# Patient Record
Sex: Male | Born: 1955
Health system: Southern US, Community
[De-identification: ages and names within clinical notes are randomized; demographics above are authoritative.]

## PROBLEM LIST (undated history)

## (undated) DIAGNOSIS — I213 ST elevation (STEMI) myocardial infarction of unspecified site: Secondary | ICD-10-CM

## (undated) DIAGNOSIS — I63429 Cerebral infarction due to embolism of unspecified anterior cerebral artery: Secondary | ICD-10-CM

## (undated) DIAGNOSIS — I2510 Atherosclerotic heart disease of native coronary artery without angina pectoris: Secondary | ICD-10-CM

## (undated) DIAGNOSIS — K219 Gastro-esophageal reflux disease without esophagitis: Secondary | ICD-10-CM

## (undated) DIAGNOSIS — F32A Depression, unspecified: Secondary | ICD-10-CM

## (undated) DIAGNOSIS — M1990 Unspecified osteoarthritis, unspecified site: Secondary | ICD-10-CM

## (undated) DIAGNOSIS — I469 Cardiac arrest, cause unspecified: Secondary | ICD-10-CM

## (undated) DIAGNOSIS — J9621 Acute and chronic respiratory failure with hypoxia: Secondary | ICD-10-CM

## (undated) DIAGNOSIS — F1910 Other psychoactive substance abuse, uncomplicated: Secondary | ICD-10-CM

## (undated) DIAGNOSIS — F329 Major depressive disorder, single episode, unspecified: Secondary | ICD-10-CM

## (undated) DIAGNOSIS — G4730 Sleep apnea, unspecified: Secondary | ICD-10-CM

## (undated) DIAGNOSIS — E785 Hyperlipidemia, unspecified: Secondary | ICD-10-CM

## (undated) DIAGNOSIS — J690 Pneumonitis due to inhalation of food and vomit: Secondary | ICD-10-CM

## (undated) DIAGNOSIS — F419 Anxiety disorder, unspecified: Secondary | ICD-10-CM

## (undated) DIAGNOSIS — G4733 Obstructive sleep apnea (adult) (pediatric): Secondary | ICD-10-CM

## (undated) DIAGNOSIS — I10 Essential (primary) hypertension: Secondary | ICD-10-CM

## (undated) HISTORY — PX: OTHER SURGICAL HISTORY: SHX169

## (undated) HISTORY — PX: PEG PLACEMENT: SHX5437

## (undated) HISTORY — DX: Atherosclerotic heart disease of native coronary artery without angina pectoris: I25.10

## (undated) HISTORY — DX: Hyperlipidemia, unspecified: E78.5

## (undated) HISTORY — PX: CARDIAC CATHETERIZATION: SHX172

## (undated) HISTORY — DX: Unspecified osteoarthritis, unspecified site: M19.90

## (undated) HISTORY — DX: Essential (primary) hypertension: I10

## (undated) HISTORY — PX: TRACHEOSTOMY: SUR1362

## (undated) HISTORY — PX: COLONOSCOPY: SHX174

## (undated) HISTORY — DX: Depression, unspecified: F32.A

## (undated) HISTORY — DX: Anxiety disorder, unspecified: F41.9

---
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
## 1998-10-15 ENCOUNTER — Emergency Department (HOSPITAL_COMMUNITY): Admission: EM | Admit: 1998-10-15 | Discharge: 1998-10-15 | Payer: Self-pay | Admitting: Emergency Medicine

## 1998-10-17 ENCOUNTER — Emergency Department (HOSPITAL_COMMUNITY): Admission: EM | Admit: 1998-10-17 | Discharge: 1998-10-17 | Payer: Self-pay | Admitting: Emergency Medicine

## 1998-10-24 ENCOUNTER — Emergency Department (HOSPITAL_COMMUNITY): Admission: EM | Admit: 1998-10-24 | Discharge: 1998-10-24 | Payer: Self-pay | Admitting: Emergency Medicine

## 2002-01-23 ENCOUNTER — Ambulatory Visit (HOSPITAL_BASED_OUTPATIENT_CLINIC_OR_DEPARTMENT_OTHER): Admission: RE | Admit: 2002-01-23 | Discharge: 2002-01-23 | Payer: Self-pay | Admitting: Otolaryngology

## 2002-04-10 ENCOUNTER — Encounter: Payer: Self-pay | Admitting: Internal Medicine

## 2002-04-10 ENCOUNTER — Ambulatory Visit (HOSPITAL_COMMUNITY): Admission: RE | Admit: 2002-04-10 | Discharge: 2002-04-10 | Payer: Self-pay | Admitting: Internal Medicine

## 2003-11-14 HISTORY — PX: UVULOPALATOPHARYNGOPLASTY, TONSILLECTOMY AND SEPTOPLASTY: SHX2632

## 2003-11-18 ENCOUNTER — Encounter (INDEPENDENT_AMBULATORY_CARE_PROVIDER_SITE_OTHER): Payer: Self-pay | Admitting: *Deleted

## 2003-11-18 ENCOUNTER — Ambulatory Visit (HOSPITAL_COMMUNITY): Admission: RE | Admit: 2003-11-18 | Discharge: 2003-11-19 | Payer: Self-pay | Admitting: Otolaryngology

## 2005-04-06 ENCOUNTER — Ambulatory Visit: Payer: Self-pay | Admitting: Internal Medicine

## 2006-05-22 HISTORY — PX: BICEPS TENODESIS: SHX895

## 2007-03-01 ENCOUNTER — Encounter: Payer: Self-pay | Admitting: Pulmonary Disease

## 2007-03-30 ENCOUNTER — Emergency Department (HOSPITAL_COMMUNITY): Admission: EM | Admit: 2007-03-30 | Discharge: 2007-03-30 | Payer: Self-pay | Admitting: Emergency Medicine

## 2007-04-02 ENCOUNTER — Ambulatory Visit (HOSPITAL_COMMUNITY): Admission: RE | Admit: 2007-04-02 | Discharge: 2007-04-02 | Payer: Self-pay | Admitting: Orthopedic Surgery

## 2008-02-27 ENCOUNTER — Encounter: Payer: Self-pay | Admitting: Pulmonary Disease

## 2009-02-15 ENCOUNTER — Ambulatory Visit: Payer: Self-pay | Admitting: Internal Medicine

## 2009-02-15 DIAGNOSIS — R0609 Other forms of dyspnea: Secondary | ICD-10-CM | POA: Insufficient documentation

## 2009-02-15 DIAGNOSIS — R0989 Other specified symptoms and signs involving the circulatory and respiratory systems: Secondary | ICD-10-CM | POA: Insufficient documentation

## 2009-02-15 DIAGNOSIS — R942 Abnormal results of pulmonary function studies: Secondary | ICD-10-CM | POA: Insufficient documentation

## 2009-03-05 ENCOUNTER — Ambulatory Visit: Payer: Self-pay | Admitting: Pulmonary Disease

## 2009-03-12 ENCOUNTER — Ambulatory Visit: Payer: Self-pay | Admitting: Internal Medicine

## 2009-03-13 LAB — CONVERTED CEMR LAB
ALT: 68 units/L — ABNORMAL HIGH (ref 0–53)
AST: 47 units/L — ABNORMAL HIGH (ref 0–37)
Albumin: 4.2 g/dL (ref 3.5–5.2)
Alkaline Phosphatase: 36 units/L — ABNORMAL LOW (ref 39–117)
BUN: 16 mg/dL (ref 6–23)
Basophils Absolute: 0 10*3/uL (ref 0.0–0.1)
Basophils Relative: 0.4 % (ref 0.0–3.0)
Chloride: 102 meq/L (ref 96–112)
Creatinine, Ser: 1.4 mg/dL (ref 0.4–1.5)
Direct LDL: 228 mg/dL
Eosinophils Relative: 3.4 % (ref 0.0–5.0)
Glucose, Bld: 87 mg/dL (ref 70–99)
Hemoglobin: 20.2 g/dL — ABNORMAL HIGH (ref 13.0–17.0)
Lymphocytes Relative: 28.7 % (ref 12.0–46.0)
Monocytes Relative: 9.1 % (ref 3.0–12.0)
Neutro Abs: 3.9 10*3/uL (ref 1.4–7.7)
Potassium: 4.5 meq/L (ref 3.5–5.1)
RBC: 6.21 M/uL — ABNORMAL HIGH (ref 4.22–5.81)
Total CHOL/HDL Ratio: 11
Total Protein, Urine: NEGATIVE mg/dL
Total Protein: 7.1 g/dL (ref 6.0–8.3)
Urine Glucose: NEGATIVE mg/dL
VLDL: 44.8 mg/dL — ABNORMAL HIGH (ref 0.0–40.0)

## 2009-03-19 ENCOUNTER — Ambulatory Visit: Payer: Self-pay | Admitting: Internal Medicine

## 2009-03-19 DIAGNOSIS — R7989 Other specified abnormal findings of blood chemistry: Secondary | ICD-10-CM | POA: Insufficient documentation

## 2009-03-19 DIAGNOSIS — R7401 Elevation of levels of liver transaminase levels: Secondary | ICD-10-CM | POA: Insufficient documentation

## 2009-03-19 DIAGNOSIS — F1011 Alcohol abuse, in remission: Secondary | ICD-10-CM | POA: Insufficient documentation

## 2009-03-19 DIAGNOSIS — R740 Nonspecific elevation of levels of transaminase and lactic acid dehydrogenase [LDH]: Secondary | ICD-10-CM

## 2009-03-19 DIAGNOSIS — E785 Hyperlipidemia, unspecified: Secondary | ICD-10-CM | POA: Insufficient documentation

## 2009-03-31 ENCOUNTER — Telehealth: Payer: Self-pay | Admitting: Internal Medicine

## 2009-05-22 HISTORY — PX: ANTERIOR CERVICAL DECOMP/DISCECTOMY FUSION: SHX1161

## 2009-08-19 ENCOUNTER — Encounter: Admission: RE | Admit: 2009-08-19 | Discharge: 2009-08-19 | Payer: Self-pay | Admitting: Orthopedic Surgery

## 2010-06-12 ENCOUNTER — Encounter: Payer: Self-pay | Admitting: Orthopedic Surgery

## 2010-06-12 ENCOUNTER — Encounter: Payer: Self-pay | Admitting: Internal Medicine

## 2010-10-07 NOTE — Op Note (Signed)
NAME:  Jonathon Griffin, Jonathon Griffin NO.:  [id number]   MEDICAL RECORD NO.:  [id number]                   PATIENT TYPE:  OIB   LOCATION:  2899                                 FACILITY:  [facility]   PHYSICIAN:  Suzanna Obey, M.D.                    DATE OF BIRTH:  Sep 17, 1955   DATE OF PROCEDURE:  11/18/2003  DATE OF DISCHARGE:                                 OPERATIVE REPORT   PREOPERATIVE DIAGNOSES:  1. Deviated septum.  2. Turbinate hypertrophy.  3. Obstructive sleep apnea.   POSTOPERATIVE DIAGNOSES:  1. Deviated septum.  2. Turbinate hypertrophy.  3. Obstructive sleep apnea.   SURGICAL PROCEDURES:  1. Septoplasty.  2. Submucous resection of inferior turbinates.  3. Uvulopalatopharyngoplasty.  4. Tonsillectomy.   ANESTHESIA:  General endotracheal tube.   ESTIMATED BLOOD LOSS:  Less than 20 mL.   INDICATIONS:  This is a 55 year old who has had significant obstructive  sleep apnea with evidence of anatomy problems with nasal obstruction and  palate issues.  He was informed of the risks and benefits of the procedure  including bleeding, infection, uvulopharyngeal insufficiency, change in the  voice, chronic pain, septal perforation, change in the external appearance  of the nose, chronic crusting and drying, and risk of the anesthetic.  All  questions were answered and consent was obtained.   OPERATION:  The patient was taken to the operating room and placed in the  supine position.  After adequate general endotracheal tube anesthesia, he  was placed in the supine position, prepped and draped in the usual sterile  manner.  Oxymetazoline pledgets were placed into the nose bilaterally and  then the septum and inferior turbinates were injected with 1% lidocaine with  1:100,000 epinephrine.  A right hemitransfixion incision was performed  raising the mucoperichondrial nostril flap.  The cartilage was divided at  about 2 cm posterior to the caudal strut and this  cartilage was removed with  a Therapist, nutritional.  The opposite flap was elevated and the Jansen-Middleton  forceps were used to remove the deviated portion of the bone.  The inferior  spur was removed with a 4 mm osteotome.  This corrected the septal  deflection.  Turbinates were infractured, a midline incision made with a 15  blade, mucosal flap elevated superiorly and inferior mucosa and bone were  removed with the turbinate scissors.  The edge was cauterized with suction  cautery and both turbinates were outfractured.  Semitransfixion incision  closed with interrupted 4-0 chromic and a 4-0 plain gut quilting stitch  placed to the septum.  A Telfa roll soaked in Bacitracin was placed into the  nose bilaterally and secured with a 3-0 nylon.  The operation was directed  towards the oral cavity at that point where a Crowe-Davis mouthgag was  inserted, retracted and suspended from the [facility] stand.  The patient had very  redundant palate and  thick tissues.  An incision was made just at the base  of the uvula and with electrocautery carried into the left tonsillar area  where the capsule of the tonsil was identified and removed with  electrocautery dissection.  The dissection was then carried across the  palate into the right tonsillar fossa and the tonsil was removed again with  electrocautery dissection.  At this point, the specimen was removed en bloc  with the uvula and both tonsils together.  A hot suction cautery was used to  attain hemostasis.  The palate was closed with interrupted 3-0 Vicryl.  This  made a nice closure of the palate and tonsillar fossae.  The hypopharynx,  esophagus and stomach were suctioned with an NG tube.  The oral cavity,  oropharynx was irrigated with saline.  The Crowe-Davis was released and  resuspended with hemostasis present in all locations.  The patient was  awakened and brought to recovery in stable condition, counts correct.                                                Suzanna Obey, M.D.    Jonathon Griffin  D:  11/18/2003  T:  11/18/2003  Job:  [id number]   cc:   Jonathon Griffin, M.D. [facility]

## 2011-04-12 IMAGING — CR DG CHEST 2V
2 series · 2 of 2 positions shown · non-contrast
Comparison: 04/08/1999

CLINICAL DATA: Dyspnea

CHEST - 2 VIEW

[view not recorded (1 of 2)]
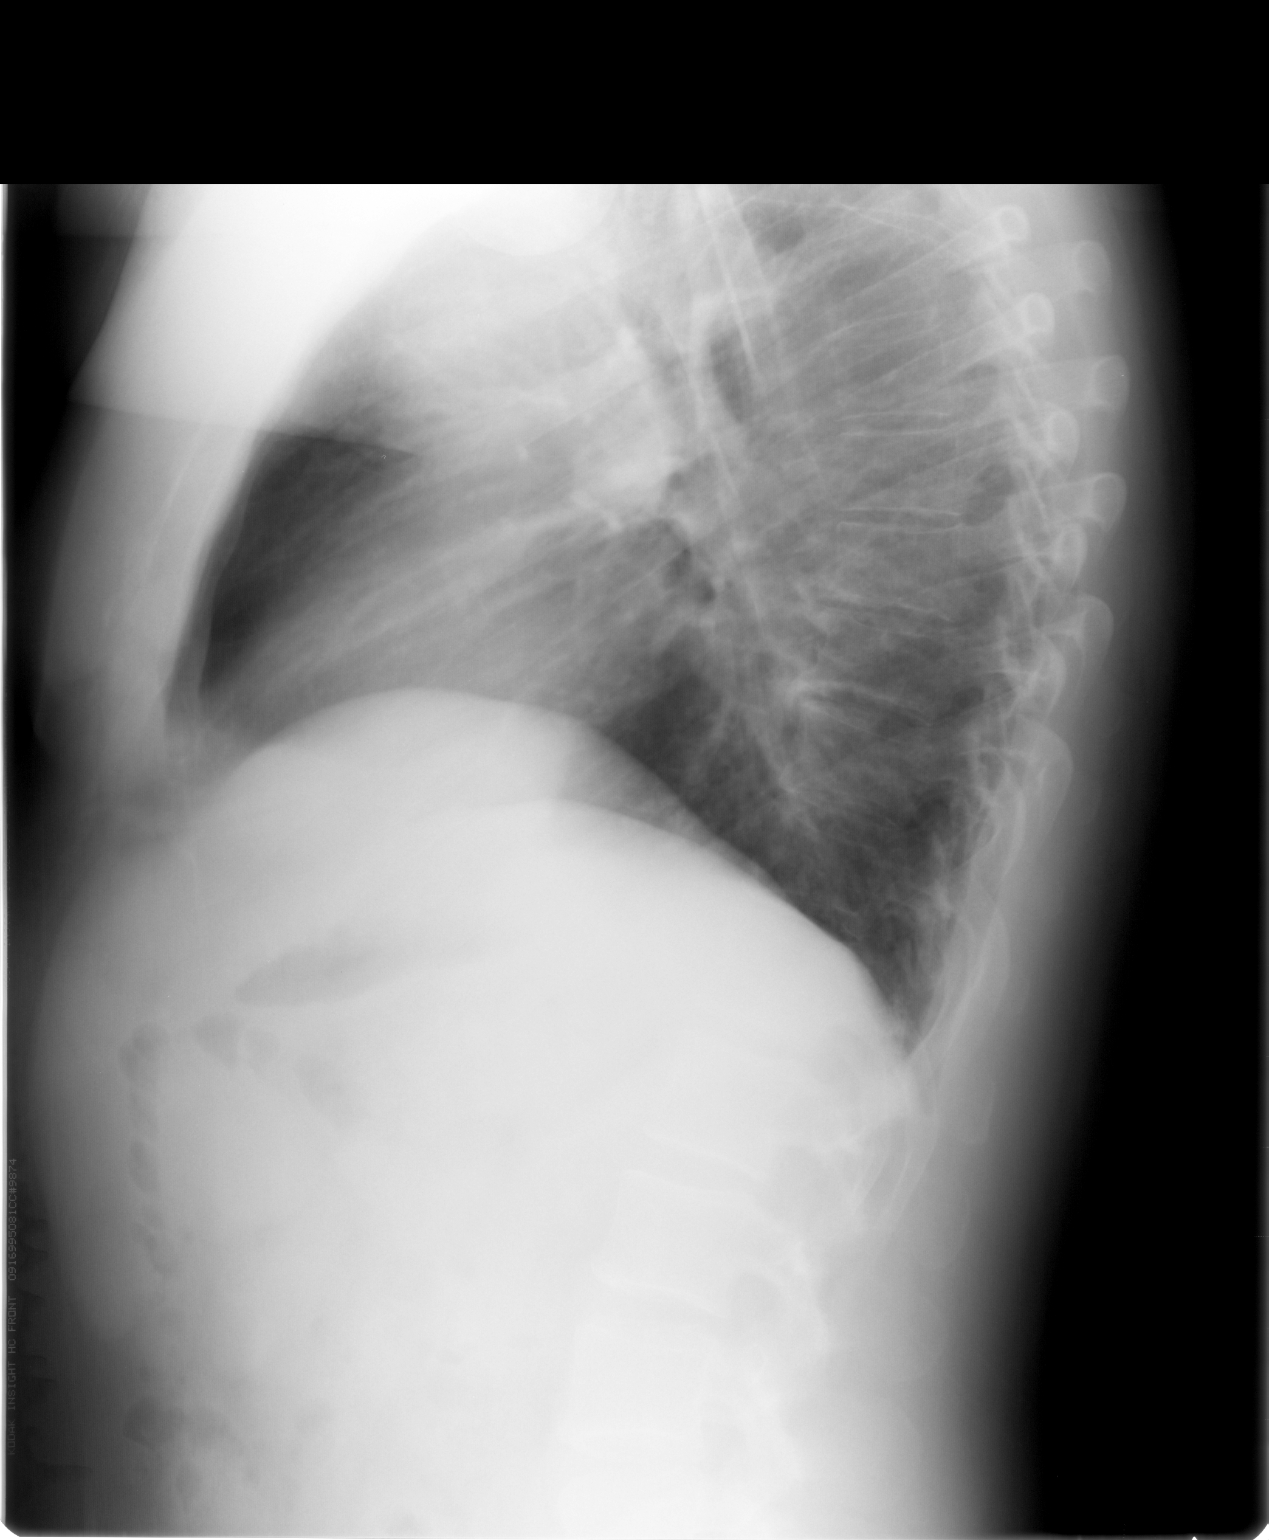

[view not recorded (2 of 2)]
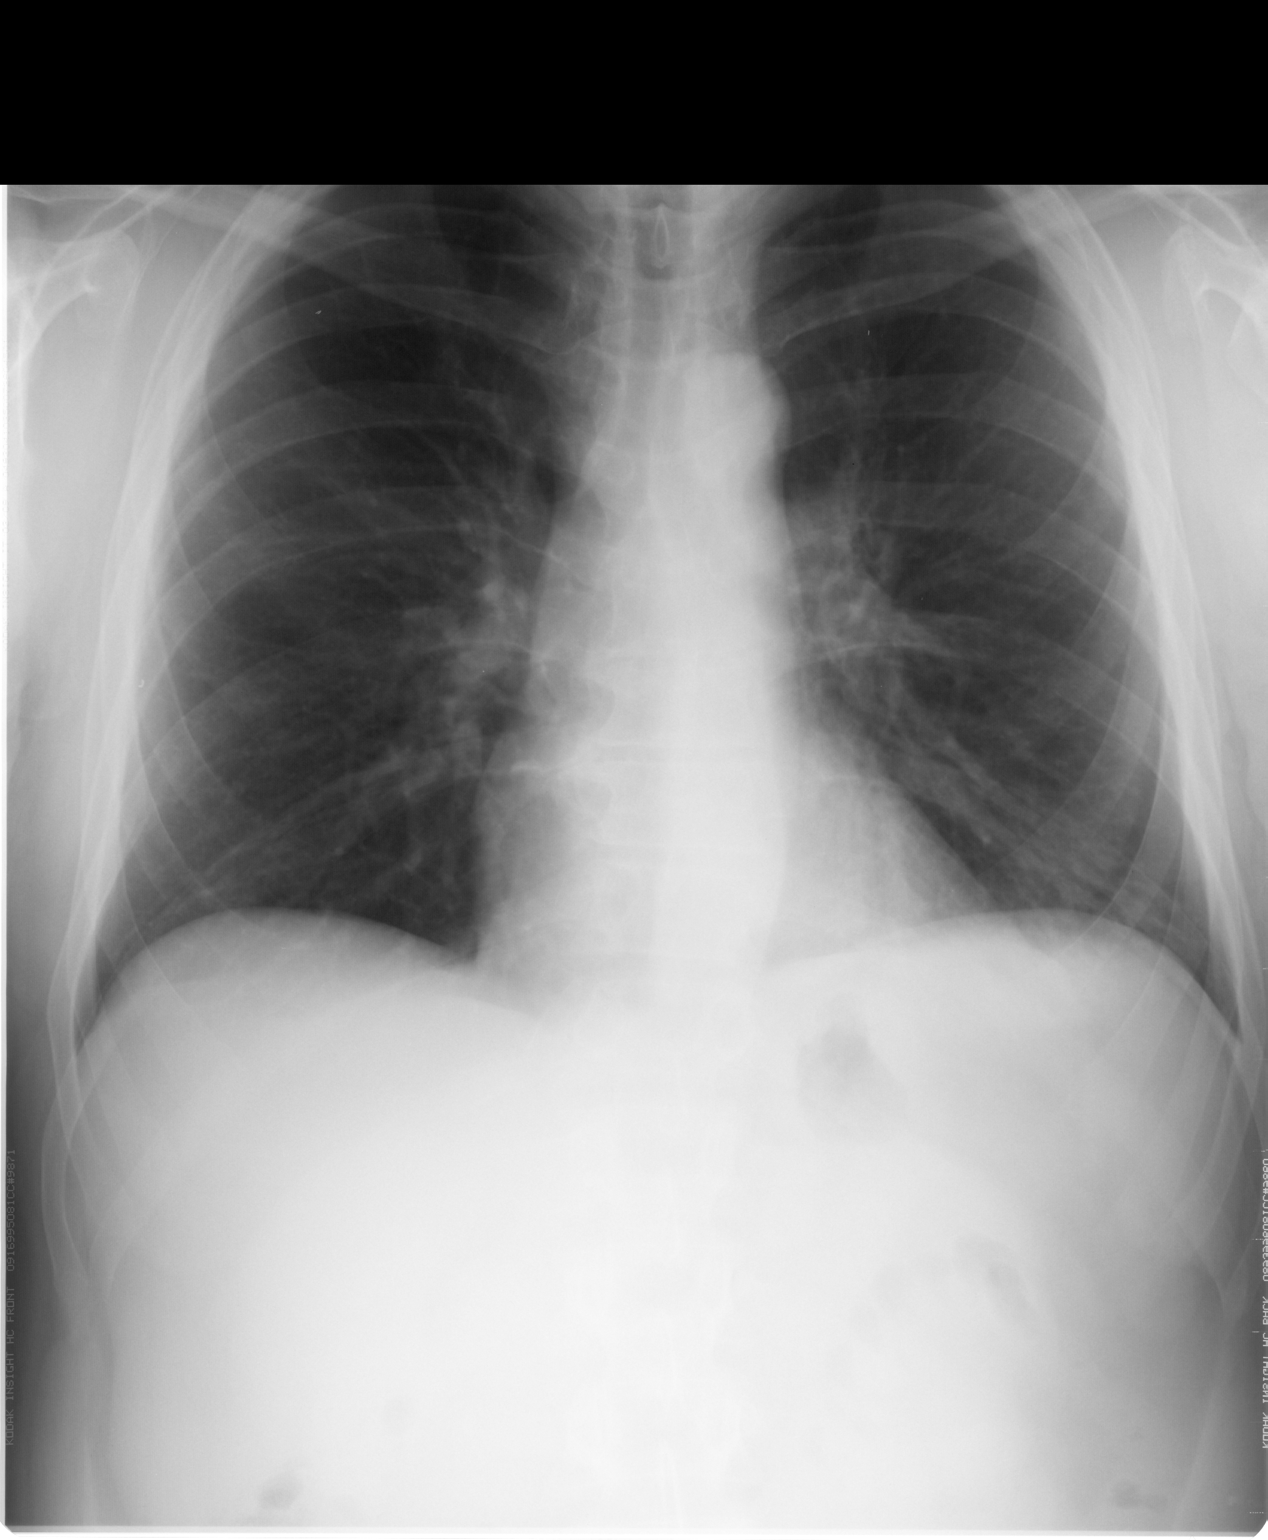

[2 of 2 positions shown; findings below may reference images not displayed]

FINDINGS: Cardiomediastinal silhouette is stable.  No acute
infiltrate or pleural effusion.  No pulmonary edema.  Bony thorax
is stable.
IMPRESSION: No active disease.  No significant change.

## 2012-04-11 ENCOUNTER — Encounter: Payer: Self-pay | Admitting: Internal Medicine

## 2012-04-11 ENCOUNTER — Ambulatory Visit (INDEPENDENT_AMBULATORY_CARE_PROVIDER_SITE_OTHER): Payer: BC Managed Care – PPO | Admitting: Internal Medicine

## 2012-04-11 ENCOUNTER — Other Ambulatory Visit: Payer: BC Managed Care – PPO

## 2012-04-11 VITALS — BP 160/108 | HR 76 | Temp 98.1°F | Resp 16 | Ht 70.0 in | Wt 205.0 lb

## 2012-04-11 DIAGNOSIS — F4321 Adjustment disorder with depressed mood: Secondary | ICD-10-CM | POA: Insufficient documentation

## 2012-04-11 DIAGNOSIS — Z7721 Contact with and (suspected) exposure to potentially hazardous body fluids: Secondary | ICD-10-CM

## 2012-04-11 DIAGNOSIS — F341 Dysthymic disorder: Secondary | ICD-10-CM

## 2012-04-11 DIAGNOSIS — F329 Major depressive disorder, single episode, unspecified: Secondary | ICD-10-CM

## 2012-04-11 MED ORDER — BUPROPION HCL ER (XL) 150 MG PO TB24: 150.0000 mg | ORAL_TABLET | Freq: Every day | ORAL | Status: DC

## 2012-04-11 NOTE — Progress Notes (Signed)
Patient ID: Jonathon Griffin, male   DOB: 1956/04/04, 56 y.o.   MRN: [id number]  H/o unprotected sex Needs herpes testing - no sx's

## 2012-04-12 ENCOUNTER — Ambulatory Visit: Payer: Self-pay | Admitting: Internal Medicine

## 2012-04-12 LAB — HSV(HERPES SIMPLEX VRS) I + II AB-IGG
HSV 1 Glycoprotein G Ab, IgG: 9.37 IV — ABNORMAL HIGH
HSV 2 Glycoprotein G Ab, IgG: <0.1 IV

## 2012-04-13 ENCOUNTER — Telehealth: Payer: Self-pay | Admitting: Internal Medicine

## 2012-04-13 NOTE — Telephone Encounter (Signed)
Jonathon Griffin, please, inform patient that his genital herpes test was negative - good news. 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 This is a non-STD virus.  Please, mail the labs to the patient.    Thx!

## 2012-04-14 NOTE — Assessment & Plan Note (Addendum)
Discussed: he is in marital counseling We started Wellbutrin XL

## 2012-04-14 NOTE — Assessment & Plan Note (Signed)
2012: he requested HSV 1,2 Ab - he is not interested in any other tests

## 2012-04-15 NOTE — Telephone Encounter (Signed)
Pt informed/ copies mailed.  

## 2012-06-14 ENCOUNTER — Encounter: Payer: Self-pay | Admitting: Internal Medicine

## 2012-06-14 ENCOUNTER — Ambulatory Visit (INDEPENDENT_AMBULATORY_CARE_PROVIDER_SITE_OTHER): Payer: BC Managed Care – PPO | Admitting: Internal Medicine

## 2012-06-14 VITALS — BP 142/90 | HR 71 | Temp 98.4°F | Wt 207.4 lb

## 2012-06-14 DIAGNOSIS — R030 Elevated blood-pressure reading, without diagnosis of hypertension: Secondary | ICD-10-CM

## 2012-06-14 DIAGNOSIS — F1020 Alcohol dependence, uncomplicated: Secondary | ICD-10-CM

## 2012-06-14 DIAGNOSIS — E785 Hyperlipidemia, unspecified: Secondary | ICD-10-CM

## 2012-06-14 DIAGNOSIS — F341 Dysthymic disorder: Secondary | ICD-10-CM

## 2012-06-14 DIAGNOSIS — IMO0001 Reserved for inherently not codable concepts without codable children: Secondary | ICD-10-CM | POA: Insufficient documentation

## 2012-06-14 DIAGNOSIS — F329 Major depressive disorder, single episode, unspecified: Secondary | ICD-10-CM

## 2012-06-14 MED ORDER — VITAMIN D 1000 UNITS PO TABS: 1000.0000 [IU] | ORAL_TABLET | Freq: Every day | ORAL | Status: AC

## 2012-06-14 MED ORDER — NORTRIPTYLINE HCL 25 MG PO CAPS: 25.0000 mg | ORAL_CAPSULE | Freq: Every day | ORAL | Status: DC

## 2012-06-14 NOTE — Assessment & Plan Note (Signed)
We will try Pamelor at [facility]

## 2012-06-14 NOTE — Progress Notes (Deleted)
Patient ID: Jonathon Griffin, male   DOB: November 19, 1955, 57 y.o.   MRN: [id number]

## 2012-06-14 NOTE — Assessment & Plan Note (Signed)
Will watch NAS diet 

## 2012-06-14 NOTE — Progress Notes (Signed)
  Subjective:    HPI  The patient presents for a follow-up of  chronic hypertension, chronic situational depression, stress   BP Readings from Last 3 Encounters:  06/14/12 142/90  04/11/12 160/108  03/19/09 132/92   Wt Readings from Last 3 Encounters:  06/14/12 207 lb 6.4 oz (94.076 kg)  04/11/12 205 lb (92.987 kg)  03/19/09 223 lb (101.152 kg)    Review of Systems  Constitutional: Negative for appetite change, fatigue and unexpected weight change.  HENT: Negative for nosebleeds, congestion, sore throat, sneezing, trouble swallowing and neck pain.   Eyes: Negative for itching and visual disturbance.  Respiratory: Negative for cough.   Cardiovascular: Negative for chest pain, palpitations and leg swelling.  Gastrointestinal: Negative for nausea, diarrhea, blood in stool and abdominal distention.  Genitourinary: Negative for frequency and hematuria.  Musculoskeletal: Negative for back pain, joint swelling and gait problem.  Skin: Negative for rash.  Neurological: Negative for dizziness, tremors, speech difficulty and weakness.  Psychiatric/Behavioral: Negative for suicidal ideas, sleep disturbance, dysphoric mood and agitation. The patient is not nervous/anxious.        Objective:   Physical Exam  Constitutional: He is oriented to person, place, and time. He appears well-developed.  HENT:  Mouth/Throat: Oropharynx is clear and moist.  Eyes: Conjunctivae normal are normal. Pupils are equal, round, and reactive to light.  Neck: Normal range of motion. No JVD present. No thyromegaly present.  Cardiovascular: Normal rate, regular rhythm, normal heart sounds and intact distal pulses.  Exam reveals no gallop and no friction rub.   No murmur heard. Pulmonary/Chest: Effort normal and breath sounds normal. No respiratory distress. He has no wheezes. He has no rales. He exhibits no tenderness.  Abdominal: Soft. Bowel sounds are normal. He exhibits no distension and no mass. There is  no tenderness. There is no rebound and no guarding.  Musculoskeletal: Normal range of motion. He exhibits no edema and no tenderness.  Lymphadenopathy:    He has no cervical adenopathy.  Neurological: He is alert and oriented to person, place, and time. He has normal reflexes. No cranial nerve deficit. He exhibits normal muscle tone. Coordination normal.  Skin: Skin is warm and dry. No rash noted.  Psychiatric: He has a normal mood and affect. His behavior is normal. Judgment and thought content normal.   Lab Results  Component Value Date   WBC 6.6 03/12/2009   HGB 20.2 H g/dL* 47/82/9562   HCT 13.0 H %* 03/12/2009   PLT 223.0 03/12/2009   GLUCOSE 87 03/12/2009   CHOL 296* 03/12/2009   TRIG 224.0* 03/12/2009   HDL 26.90* 03/12/2009   LDLDIRECT 228.0 03/12/2009   ALT 68* 03/12/2009   AST 47* 03/12/2009   NA 141 03/12/2009   K 4.5 03/12/2009   CL 102 03/12/2009   CREATININE 1.4 03/12/2009   BUN 16 03/12/2009   CO2 31 03/12/2009   TSH 1.60 03/12/2009   PSA 0.97 03/12/2009          Assessment & Plan:

## 2012-06-19 ENCOUNTER — Encounter: Payer: Self-pay | Admitting: Internal Medicine

## 2012-06-19 NOTE — Assessment & Plan Note (Signed)
  On diet  

## 2012-06-19 NOTE — Assessment & Plan Note (Signed)
Discussed.

## 2012-08-16 ENCOUNTER — Ambulatory Visit (INDEPENDENT_AMBULATORY_CARE_PROVIDER_SITE_OTHER): Payer: BC Managed Care – PPO | Admitting: Internal Medicine

## 2012-08-16 ENCOUNTER — Encounter: Payer: Self-pay | Admitting: Internal Medicine

## 2012-08-16 VITALS — BP 150/110 | HR 80 | Temp 98.0°F | Resp 16 | Wt 211.0 lb

## 2012-08-16 DIAGNOSIS — F329 Major depressive disorder, single episode, unspecified: Secondary | ICD-10-CM

## 2012-08-16 DIAGNOSIS — G4700 Insomnia, unspecified: Secondary | ICD-10-CM

## 2012-08-16 DIAGNOSIS — IMO0001 Reserved for inherently not codable concepts without codable children: Secondary | ICD-10-CM

## 2012-08-16 DIAGNOSIS — F341 Dysthymic disorder: Secondary | ICD-10-CM

## 2012-08-16 DIAGNOSIS — R030 Elevated blood-pressure reading, without diagnosis of hypertension: Secondary | ICD-10-CM

## 2012-08-16 MED ORDER — ZOLPIDEM TARTRATE 10 MG PO TABS: 10.0000 mg | ORAL_TABLET | Freq: Every evening | ORAL | Status: DC | PRN

## 2012-08-16 MED ORDER — LOSARTAN POTASSIUM 100 MG PO TABS: 100.0000 mg | ORAL_TABLET | Freq: Every day | ORAL | Status: DC

## 2012-08-16 NOTE — Assessment & Plan Note (Signed)
Zolpidem prn 

## 2012-08-16 NOTE — Patient Instructions (Signed)
BP Readings from Last 3 Encounters:  08/16/12 150/110  06/14/12 142/90  04/11/12 160/108

## 2012-08-16 NOTE — Progress Notes (Signed)
  Subjective:    HPI  The patient presents for a follow-up of  chronic hypertension, chronic situational depression, stress - better C/o insomnia   BP Readings from Last 3 Encounters:  06/14/12 142/90  04/11/12 160/108  03/19/09 132/92   Wt Readings from Last 3 Encounters:  08/16/12 211 lb (95.709 kg)  06/14/12 207 lb 6.4 oz (94.076 kg)  04/11/12 205 lb (92.987 kg)    Review of Systems  Constitutional: Negative for appetite change, fatigue and unexpected weight change.  HENT: Negative for nosebleeds, congestion, sore throat, sneezing, trouble swallowing and neck pain.   Eyes: Negative for itching and visual disturbance.  Respiratory: Negative for cough.   Cardiovascular: Negative for chest pain, palpitations and leg swelling.  Gastrointestinal: Negative for nausea, diarrhea, blood in stool and abdominal distention.  Genitourinary: Negative for frequency and hematuria.  Musculoskeletal: Negative for back pain, joint swelling and gait problem.  Skin: Negative for rash.  Neurological: Negative for dizziness, tremors, speech difficulty and weakness.  Psychiatric/Behavioral: Negative for suicidal ideas, sleep disturbance, dysphoric mood and agitation. The patient is not nervous/anxious.        Objective:   Physical Exam  Constitutional: He is oriented to person, place, and time. He appears well-developed.  HENT:  Mouth/Throat: Oropharynx is clear and moist.  Eyes: Conjunctivae are normal. Pupils are equal, round, and reactive to light.  Neck: Normal range of motion. No JVD present. No thyromegaly present.  Cardiovascular: Normal rate, regular rhythm, normal heart sounds and intact distal pulses.  Exam reveals no gallop and no friction rub.   No murmur heard. Pulmonary/Chest: Effort normal and breath sounds normal. No respiratory distress. He has no wheezes. He has no rales. He exhibits no tenderness.  Abdominal: Soft. Bowel sounds are normal. He exhibits no distension and no  mass. There is no tenderness. There is no rebound and no guarding.  Musculoskeletal: Normal range of motion. He exhibits no edema and no tenderness.  Lymphadenopathy:    He has no cervical adenopathy.  Neurological: He is alert and oriented to person, place, and time. He has normal reflexes. No cranial nerve deficit. He exhibits normal muscle tone. Coordination normal.  Skin: Skin is warm and dry. No rash noted.  Psychiatric: He has a normal mood and affect. His behavior is normal. Judgment and thought content normal.   Lab Results  Component Value Date   WBC 6.6 03/12/2009   HGB 20.2 H g/dL* 16/02/9603   HCT 54.0 H %* 03/12/2009   PLT 223.0 03/12/2009   GLUCOSE 87 03/12/2009   CHOL 296* 03/12/2009   TRIG 224.0* 03/12/2009   HDL 26.90* 03/12/2009   LDLDIRECT 228.0 03/12/2009   ALT 68* 03/12/2009   AST 47* 03/12/2009   NA 141 03/12/2009   K 4.5 03/12/2009   CL 102 03/12/2009   CREATININE 1.4 03/12/2009   BUN 16 03/12/2009   CO2 31 03/12/2009   TSH 1.60 03/12/2009   PSA 0.97 03/12/2009          Assessment & Plan:

## 2012-08-16 NOTE — Assessment & Plan Note (Signed)
Better  

## 2012-12-09 ENCOUNTER — Other Ambulatory Visit: Payer: Self-pay | Admitting: Internal Medicine

## 2013-02-20 ENCOUNTER — Other Ambulatory Visit: Payer: Self-pay | Admitting: Internal Medicine

## 2013-02-20 NOTE — Telephone Encounter (Signed)
sch OV 

## 2013-02-21 NOTE — Telephone Encounter (Signed)
Notified [facility] spoke with alex gave md approval...lmb

## 2013-04-21 ENCOUNTER — Other Ambulatory Visit: Payer: Self-pay | Admitting: Internal Medicine

## 2013-06-11 ENCOUNTER — Other Ambulatory Visit: Payer: Self-pay | Admitting: Internal Medicine

## 2013-06-12 ENCOUNTER — Other Ambulatory Visit: Payer: Self-pay | Admitting: Internal Medicine

## 2013-08-27 ENCOUNTER — Other Ambulatory Visit: Payer: Self-pay | Admitting: Internal Medicine

## 2013-09-10 ENCOUNTER — Other Ambulatory Visit: Payer: Self-pay | Admitting: Internal Medicine

## 2014-01-17 ENCOUNTER — Other Ambulatory Visit: Payer: Self-pay | Admitting: Internal Medicine

## 2014-02-23 ENCOUNTER — Other Ambulatory Visit: Payer: Self-pay | Admitting: Internal Medicine

## 2014-02-27 ENCOUNTER — Other Ambulatory Visit: Payer: Self-pay | Admitting: Internal Medicine

## 2014-03-06 ENCOUNTER — Ambulatory Visit (INDEPENDENT_AMBULATORY_CARE_PROVIDER_SITE_OTHER): Payer: 59 | Admitting: Internal Medicine

## 2014-03-06 ENCOUNTER — Encounter: Payer: Self-pay | Admitting: Internal Medicine

## 2014-03-06 VITALS — BP 152/90 | HR 72 | Temp 98.0°F | Resp 13 | Wt 225.0 lb

## 2014-03-06 DIAGNOSIS — E785 Hyperlipidemia, unspecified: Secondary | ICD-10-CM

## 2014-03-06 DIAGNOSIS — R740 Nonspecific elevation of levels of transaminase and lactic acid dehydrogenase [LDH]: Secondary | ICD-10-CM

## 2014-03-06 DIAGNOSIS — I10 Essential (primary) hypertension: Secondary | ICD-10-CM

## 2014-03-06 DIAGNOSIS — D751 Secondary polycythemia: Secondary | ICD-10-CM | POA: Insufficient documentation

## 2014-03-06 DIAGNOSIS — G4700 Insomnia, unspecified: Secondary | ICD-10-CM

## 2014-03-06 DIAGNOSIS — R7401 Elevation of levels of liver transaminase levels: Secondary | ICD-10-CM

## 2014-03-06 MED ORDER — LOSARTAN POTASSIUM 100 MG PO TABS: ORAL_TABLET | ORAL | Status: DC

## 2014-03-06 MED ORDER — ZOLPIDEM TARTRATE 10 MG PO TABS: ORAL_TABLET | ORAL | Status: DC

## 2014-03-06 NOTE — Progress Notes (Signed)
Pre visit review using our clinic review tool, if applicable. No additional management support is needed unless otherwise documented below in the visit note. 

## 2014-03-06 NOTE — Patient Instructions (Signed)
To prevent sleep dysfunction follow these instructions for sleep hygiene. Do not read, watch TV, or eat in bed. Do not get into bed until you are ready to turn off the light &  to go to sleep. Do not ingest stimulants ( decongestants, diet pills, nicotine, caffeine) after the evening meal.Do not take daytime naps.Cardiovascular exercise, this can be as simple a program as walking, is recommended 30-45 minutes 3-4 times per week. If you're not exercising you should take 6-8 weeks to build up to this level.   Minimal Blood Pressure Goal= AVERAGE < 140/90;  Ideal is an AVERAGE < 135/85. This AVERAGE should be calculated from @ least 5-7 BP readings taken @ different times of day on different days of week. You should not respond to isolated BP readings , but rather the AVERAGE for that week .Please bring your  blood pressure cuff to office visits to verify that it is reliable.It  can also be checked against the blood pressure device at the pharmacy. Finger or wrist cuffs are not dependable; an arm cuff is. 

## 2014-03-06 NOTE — Assessment & Plan Note (Signed)
Blood pressure goals reviewed. BMET 

## 2014-03-06 NOTE — Assessment & Plan Note (Signed)
CBC & dif 

## 2014-03-06 NOTE — Assessment & Plan Note (Signed)
NMR Lipoprofile  

## 2014-03-06 NOTE — Progress Notes (Signed)
   Subjective:    Patient ID: Jonathon SparrowDavid R Kuehnle, male    DOB: 1955/09/01, 58 y.o.   MRN: [id number]  HPI   He is here for refill of his Ambien. He takes this one half every night. He has difficulty going to sleep but not staying asleep.  He previously had a uvulectomy to treat sleep apnea. His wife states he still does snore.  He has no symptoms of restless leg.  He states he takes the Ambien as his wife gets "snarky" right before bedtime and he cannot stop thinking about it after getting in bed.  He has not been monitoring his blood pressure and takes his blood pressure pill "hit and miss".  He is on modified heart healthy diet. He does add salt at the table he works out at a gym with weights but not cardiovascularly. He does deny active cardio pulmonary symptoms  He has not had lab studies done since 03/12/09. These were reviewed with him. Alkaline phosphatase was slightly low and there is mild elevation of AST and ALT.His LDL was 228.  He denies any family history heart attack or stroke  His hemoglobin was 20.2 & hematocrit 58.7. He did donate blood recently. He is unsure of his blood counts then.  In October 2010 thyroid function, glucose, and PSA were excellent. Additionally renal function was normal.       Review of Systems  Chest pain, palpitations, tachycardia, exertional dyspnea, paroxysmal nocturnal dyspnea, claudication or edema are absent.      Objective:   Physical Exam   Positive or pertinent findings include: Mustache. He is status post uvulectomy. He has minimal mixed arthritic changes of the DIP and PIP joints.  General appearance :adequately nourished; in no distress. Eyes: No conjunctival inflammation or scleral icterus is present. Oral exam: Dental hygiene is good. Lips and gums are healthy appearing.There is no oropharyngeal erythema or exudate noted.  Heart:  Normal rate and regular rhythm. S1 and S2 normal without gallop, murmur, click, rub or other  extra sounds Lungs:Chest clear to auscultation; no wheezes, rhonchi,rales ,or rubs present.No increased work of breathing.  Abdomen: bowel sounds normal, soft and non-tender without masses, organomegaly or hernias noted.  No guarding or rebound.  Vascular : all pulses equal ; no bruits present. Skin:Warm & dry.  Intact without suspicious lesions or rashes ; no jaundice or tenting Lymphatic: No lymphadenopathy is noted about the head, neck, axilla            Assessment & Plan:  See Current Assessment & Plan in Problem List under specific Diagnosis

## 2014-03-06 NOTE — Assessment & Plan Note (Addendum)
Fasting LFTs

## 2014-03-06 NOTE — Assessment & Plan Note (Signed)
Limited refill Ambien; risks discussed Sleep hygiene

## 2014-03-09 ENCOUNTER — Telehealth: Payer: Self-pay | Admitting: Internal Medicine

## 2014-03-09 NOTE — Telephone Encounter (Signed)
emmi mailed  °

## 2014-07-07 ENCOUNTER — Ambulatory Visit (INDEPENDENT_AMBULATORY_CARE_PROVIDER_SITE_OTHER)
Admission: RE | Admit: 2014-07-07 | Discharge: 2014-07-07 | Disposition: A | Discharge status: Home / Self Care | Payer: 59 | Source: Ambulatory Visit | Attending: Internal Medicine | Admitting: Internal Medicine

## 2014-07-07 ENCOUNTER — Encounter: Payer: Self-pay | Admitting: Internal Medicine

## 2014-07-07 ENCOUNTER — Ambulatory Visit (INDEPENDENT_AMBULATORY_CARE_PROVIDER_SITE_OTHER): Payer: 59 | Admitting: Internal Medicine

## 2014-07-07 VITALS — BP 168/100 | HR 77 | Temp 98.7°F | Wt 224.0 lb

## 2014-07-07 DIAGNOSIS — M1990 Unspecified osteoarthritis, unspecified site: Secondary | ICD-10-CM | POA: Insufficient documentation

## 2014-07-07 DIAGNOSIS — R05 Cough: Secondary | ICD-10-CM

## 2014-07-07 DIAGNOSIS — J209 Acute bronchitis, unspecified: Secondary | ICD-10-CM | POA: Insufficient documentation

## 2014-07-07 DIAGNOSIS — R059 Cough, unspecified: Secondary | ICD-10-CM | POA: Insufficient documentation

## 2014-07-07 DIAGNOSIS — I10 Essential (primary) hypertension: Secondary | ICD-10-CM

## 2014-07-07 DIAGNOSIS — Z0000 Encounter for general adult medical examination without abnormal findings: Secondary | ICD-10-CM

## 2014-07-07 DIAGNOSIS — G4700 Insomnia, unspecified: Secondary | ICD-10-CM

## 2014-07-07 DIAGNOSIS — M1810 Unilateral primary osteoarthritis of first carpometacarpal joint, unspecified hand: Secondary | ICD-10-CM

## 2014-07-07 MED ORDER — AMOXICILLIN-POT CLAVULANATE 875-125 MG PO TABS: 1.0000 | ORAL_TABLET | Freq: Two times a day (BID) | ORAL | Status: DC

## 2014-07-07 MED ORDER — ZOLPIDEM TARTRATE 10 MG PO TABS: ORAL_TABLET | ORAL | Status: DC

## 2014-07-07 MED ORDER — PROMETHAZINE-CODEINE 6.25-10 MG/5ML PO SYRP: 5.0000 mL | ORAL_SOLUTION | ORAL | Status: DC | PRN

## 2014-07-07 MED ORDER — MELOXICAM 15 MG PO TABS: 15.0000 mg | ORAL_TABLET | Freq: Every day | ORAL | Status: DC | PRN

## 2014-07-07 NOTE — Assessment & Plan Note (Signed)
Zolpidem prn  Potential benefits of a long term benzodiazepines  use as well as potential risks  and complications were explained to the patient and were aknowledged. 

## 2014-07-07 NOTE — Progress Notes (Signed)
Pre visit review using our clinic review tool, if applicable. No additional management support is needed unless otherwise documented below in the visit note. 

## 2014-07-07 NOTE — Assessment & Plan Note (Signed)
He used to be a welder PO Abx Prom-Cod CXR 

## 2014-07-07 NOTE — Assessment & Plan Note (Signed)
Continue with current prescription therapy as reflected on the Med list.  

## 2014-07-07 NOTE — Assessment & Plan Note (Signed)
Meloxicam qd Labs

## 2014-07-07 NOTE — Assessment & Plan Note (Signed)
He used to be a welder PO Abx Prom-Cod CXR

## 2014-07-07 NOTE — Progress Notes (Signed)
  Subjective:    Cough This is a new problem. The current episode started more than 1 month ago. The problem has been unchanged. The cough is productive of sputum. Pertinent negatives include no chest pain, ear congestion, nasal congestion, rash or sore throat. There is no history of COPD.  C/o fingers OA - pain and stiffness  He used to be a Psychologist, occupationalwelder  The patient presents for a follow-up of  chronic hypertension, chronic situational depression F/u insomnia   BP Readings from Last 3 Encounters:  06/14/12 142/90  04/11/12 160/108  03/19/09 132/92   Wt Readings from Last 3 Encounters:  07/07/14 224 lb (101.606 kg)  03/06/14 225 lb (102.059 kg)  08/16/12 211 lb (95.709 kg)    Review of Systems  Constitutional: Negative for appetite change, fatigue and unexpected weight change.  HENT: Negative for congestion, nosebleeds, sneezing, sore throat and trouble swallowing.   Eyes: Negative for itching and visual disturbance.  Respiratory: Negative for cough.   Cardiovascular: Negative for chest pain, palpitations and leg swelling.  Gastrointestinal: Negative for nausea, diarrhea, blood in stool and abdominal distention.  Genitourinary: Negative for frequency and hematuria.  Musculoskeletal: Negative for back pain, joint swelling, gait problem and neck pain.  Skin: Negative for rash.  Neurological: Negative for dizziness, tremors, speech difficulty and weakness.  Psychiatric/Behavioral: Negative for suicidal ideas, sleep disturbance, dysphoric mood and agitation. The patient is not nervous/anxious.        Objective:   Physical Exam  Constitutional: He is oriented to person, place, and time. He appears well-developed.  HENT:  Mouth/Throat: Oropharynx is clear and moist.  Eyes: Conjunctivae are normal. Pupils are equal, round, and reactive to light.  Neck: Normal range of motion. No JVD present. No thyromegaly present.  Cardiovascular: Normal rate, regular rhythm, normal heart sounds  and intact distal pulses.  Exam reveals no gallop and no friction rub.   No murmur heard. Pulmonary/Chest: Effort normal and breath sounds normal. No respiratory distress. He has no wheezes. He has no rales. He exhibits no tenderness.  Abdominal: Soft. Bowel sounds are normal. He exhibits no distension and no mass. There is no tenderness. There is no rebound and no guarding.  Musculoskeletal: Normal range of motion. He exhibits no edema or tenderness.  Lymphadenopathy:    He has no cervical adenopathy.  Neurological: He is alert and oriented to person, place, and time. He has normal reflexes. No cranial nerve deficit. He exhibits normal muscle tone. Coordination normal.  Skin: Skin is warm and dry. No rash noted.  Psychiatric: He has a normal mood and affect. His behavior is normal. Judgment and thought content normal.   Lab Results  Component Value Date   WBC 6.6 03/12/2009   HGB 20.2 H g/dL* 16/10/960410/22/2010   HCT 54.058.7 H %* 03/12/2009   PLT 223.0 03/12/2009   GLUCOSE 87 03/12/2009   CHOL 296* 03/12/2009   TRIG 224.0* 03/12/2009   HDL 26.90* 03/12/2009   LDLDIRECT 228.0 03/12/2009   ALT 68* 03/12/2009   AST 47* 03/12/2009   NA 141 03/12/2009   K 4.5 03/12/2009   CL 102 03/12/2009   CREATININE 1.4 03/12/2009   BUN 16 03/12/2009   CO2 31 03/12/2009   TSH 1.60 03/12/2009   PSA 0.97 03/12/2009          Assessment & Plan:

## 2014-08-20 ENCOUNTER — Encounter: Payer: Self-pay | Admitting: Family

## 2014-08-20 ENCOUNTER — Ambulatory Visit (INDEPENDENT_AMBULATORY_CARE_PROVIDER_SITE_OTHER): Payer: 59 | Admitting: Family

## 2014-08-20 VITALS — BP 138/82 | HR 85 | Temp 98.5°F | Resp 18 | Ht 70.0 in | Wt 223.4 lb

## 2014-08-20 DIAGNOSIS — R059 Cough, unspecified: Secondary | ICD-10-CM

## 2014-08-20 DIAGNOSIS — R05 Cough: Secondary | ICD-10-CM

## 2014-08-20 MED ORDER — PROMETHAZINE-CODEINE 6.25-10 MG/5ML PO SYRP: 5.0000 mL | ORAL_SOLUTION | ORAL | Status: DC | PRN

## 2014-08-20 MED ORDER — NAPROXEN 500 MG PO TABS: 500.0000 mg | ORAL_TABLET | Freq: Two times a day (BID) | ORAL | Status: DC

## 2014-08-20 MED ORDER — PREDNISONE 20 MG PO TABS: 20.0000 mg | ORAL_TABLET | Freq: Every day | ORAL | Status: DC

## 2014-08-20 MED ORDER — ALBUTEROL SULFATE HFA 108 (90 BASE) MCG/ACT IN AERS: 2.0000 | INHALATION_SPRAY | Freq: Four times a day (QID) | RESPIRATORY_TRACT | Status: DC | PRN

## 2014-08-20 MED ORDER — ALBUTEROL SULFATE (2.5 MG/3ML) 0.083% IN NEBU
2.5000 mg | INHALATION_SOLUTION | Freq: Once | RESPIRATORY_TRACT | Status: AC
  Administered 2014-08-20: 2.5 mg via RESPIRATORY_TRACT

## 2014-08-20 MED ORDER — LEVOFLOXACIN 500 MG PO TABS: 500.0000 mg | ORAL_TABLET | Freq: Every day | ORAL | Status: DC

## 2014-08-20 NOTE — Patient Instructions (Signed)
Thank you for choosing [facility].  Summary/Instructions:  Your prescription(s) have been submitted to your pharmacy or been printed and provided for you. Please take as directed and contact our office if you believe you are having problem(s) with the medication(s) or have any questions.  If your symptoms worsen or fail to improve, please contact our office for further instruction, or in case of emergency go directly to the emergency room at the closest medical facility.    Acute Bronchitis Bronchitis is inflammation of the airways that extend from the windpipe into the lungs (bronchi). The inflammation often causes mucus to develop. This leads to a cough, which is the most common symptom of bronchitis.  In acute bronchitis, the condition usually develops suddenly and goes away over time, usually in a couple weeks. Smoking, allergies, and asthma can make bronchitis worse. Repeated episodes of bronchitis may cause further lung problems.  CAUSES Acute bronchitis is most often caused by the same virus that causes a cold. The virus can spread from person to person (contagious) through coughing, sneezing, and touching contaminated objects. SIGNS AND SYMPTOMS   Cough.   Fever.   Coughing up mucus.   Body aches.   Chest congestion.   Chills.   Shortness of breath.   Sore throat.  DIAGNOSIS  Acute bronchitis is usually diagnosed through a physical exam. Your health care provider will also ask you questions about your medical history. Tests, such as chest X-rays, are sometimes done to rule out other conditions.  TREATMENT  Acute bronchitis usually goes away in a couple weeks. Oftentimes, no medical treatment is necessary. Medicines are sometimes given for relief of fever or cough. Antibiotic medicines are usually not needed but may be prescribed in certain situations. In some cases, an inhaler may be recommended to help reduce shortness of breath and control the cough. A cool  mist vaporizer may also be used to help thin bronchial secretions and make it easier to clear the chest.  HOME CARE INSTRUCTIONS  Get plenty of rest.   Drink enough fluids to keep your urine clear or pale yellow (unless you have a medical condition that requires fluid restriction). Increasing fluids may help thin your respiratory secretions (sputum) and reduce chest congestion, and it will prevent dehydration.   Take medicines only as directed by your health care provider.  If you were prescribed an antibiotic medicine, finish it all even if you start to feel better.  Avoid smoking and secondhand smoke. Exposure to cigarette smoke or irritating chemicals will make bronchitis worse. If you are a smoker, consider using nicotine gum or skin patches to help control withdrawal symptoms. Quitting smoking will help your lungs heal faster.   Reduce the chances of another bout of acute bronchitis by washing your hands frequently, avoiding people with cold symptoms, and trying not to touch your hands to your mouth, nose, or eyes.   Keep all follow-up visits as directed by your health care provider.  SEEK MEDICAL CARE IF: Your symptoms do not improve after 1 week of treatment.  SEEK IMMEDIATE MEDICAL CARE IF:  You develop an increased fever or chills.   You have chest pain.   You have severe shortness of breath.  You have bloody sputum.   You develop dehydration.  You faint or repeatedly feel like you are going to pass out.  You develop repeated vomiting.  You develop a severe headache. MAKE SURE YOU:   Understand these instructions.  Will watch your condition.  Will   get help right away if you are not doing well or get worse. Document Released: 06/15/2004 Document Revised: 09/22/2013 Document Reviewed: 10/29/2012 Exit[employer] Patient Information 2015 [employer]. This information is not intended to replace advice given to you by your health care provider. Make sure you discuss  any questions you have with your health care provider.  

## 2014-08-20 NOTE — Progress Notes (Signed)
   Subjective:    Patient ID: Jonathon SparrowDavid R Camuso, male    DOB: 10/30/1955, 59 y.o.   MRN: [id number]  Chief Complaint  Patient presents with  . Cough    was seen in february for sinus infection, still has a lingering cough and productive, started getting worse x5 days ago    HPI:  Jonathon SparrowDavid R Dike is a 59 y.o. male who presents today for an acute visit.   Recently seen in February and diagnosed with a sinus infection and treated with Augmentin. Chest x-ray at time showed no evidence of pneumonia or bronchitic change. Notes that his symptoms improved with the antibiotic but the cough never really went away. Continues to have associated symptom of cough and wheezing which has worsened over the past 5 days. Has tried OTC claritin and nyquil which provided minimal relief. Cough is intense enough to keep him up at night. Symptoms appear to worsen when laying down.    Allergies  Allergen Reactions  . Oxycodone-Acetaminophen     REACTION: itching    Current Outpatient Prescriptions on File Prior to Visit  Medication Sig Dispense Refill  . losartan (COZAAR) 100 MG tablet Take one tablet every day. 90 tablet 1  . promethazine-codeine (PHENERGAN WITH CODEINE) 6.25-10 MG/5ML syrup Take 5 mLs by mouth every 4 (four) hours as needed. 300 mL 0  . zolpidem (AMBIEN) 10 MG tablet TAKE 1 TABLET BY MOUTH AT BEDTIME AS NEEDED 30 tablet 5   No current facility-administered medications on file prior to visit.   Past Medical History  Diagnosis Date  . Arthritis   . Hypertension     Review of Systems  Constitutional: Negative for fever and chills.  HENT: Positive for congestion. Negative for sneezing.   Respiratory: Positive for cough, chest tightness (when lying) and wheezing.       Objective:    BP 138/82 mmHg  Pulse 85  Temp(Src) 98.5 F (36.9 C) (Oral)  Resp 18  Ht 5\' 10"  (1.778 m)  Wt 223 lb 6.4 oz (101.334 kg)  BMI 32.05 kg/m2  SpO2 97% Nursing note and vital signs  reviewed.  Physical Exam  Constitutional: He is oriented to person, place, and time. He appears well-developed and well-nourished. No distress.  HENT:  Right Ear: Hearing, tympanic membrane, external ear and ear canal normal.  Left Ear: Hearing, tympanic membrane, external ear and ear canal normal.  Nose: Right sinus exhibits no maxillary sinus tenderness and no frontal sinus tenderness. Left sinus exhibits no maxillary sinus tenderness and no frontal sinus tenderness.  Mouth/Throat: Uvula is midline, oropharynx is clear and moist and mucous membranes are normal.  Cardiovascular: Normal rate, regular rhythm, normal heart sounds and intact distal pulses.   Pulmonary/Chest: Effort normal. He has wheezes.  Neurological: He is alert and oriented to person, place, and time.  Skin: Skin is warm and dry.  Psychiatric: He has a normal mood and affect. His behavior is normal. Judgment and thought content normal.       Assessment & Plan:

## 2014-08-20 NOTE — Assessment & Plan Note (Signed)
Symptoms and exam consistent with acute bronchitis. In office albuterol treatment given. Start levaquin. Start prednisone. Start albuterol as needed for wheezing. Continue over the counter medications as needed for symptom relief. Follow up if symptoms worsen or fail to improve.

## 2014-08-20 NOTE — Progress Notes (Signed)
Pre visit review using our clinic review tool, if applicable. No additional management support is needed unless otherwise documented below in the visit note. 

## 2014-08-30 ENCOUNTER — Other Ambulatory Visit: Payer: Self-pay | Admitting: Internal Medicine

## 2014-10-07 ENCOUNTER — Other Ambulatory Visit: Payer: Self-pay | Admitting: Internal Medicine

## 2014-12-04 ENCOUNTER — Encounter: Payer: Self-pay | Admitting: Internal Medicine

## 2015-09-07 ENCOUNTER — Other Ambulatory Visit: Payer: Self-pay | Admitting: Internal Medicine

## 2015-11-16 ENCOUNTER — Other Ambulatory Visit: Payer: Self-pay | Admitting: Internal Medicine

## 2015-12-20 ENCOUNTER — Other Ambulatory Visit: Payer: Self-pay | Admitting: Internal Medicine

## 2016-01-04 ENCOUNTER — Other Ambulatory Visit: Payer: Self-pay | Admitting: Internal Medicine

## 2016-03-17 ENCOUNTER — Ambulatory Visit (INDEPENDENT_AMBULATORY_CARE_PROVIDER_SITE_OTHER): Payer: 59 | Admitting: Internal Medicine

## 2016-03-17 ENCOUNTER — Encounter: Payer: Self-pay | Admitting: Internal Medicine

## 2016-03-17 VITALS — BP 140/82 | HR 84 | Wt 222.0 lb

## 2016-03-17 DIAGNOSIS — G4700 Insomnia, unspecified: Secondary | ICD-10-CM | POA: Diagnosis not present

## 2016-03-17 DIAGNOSIS — Z23 Encounter for immunization: Secondary | ICD-10-CM

## 2016-03-17 DIAGNOSIS — I10 Essential (primary) hypertension: Secondary | ICD-10-CM

## 2016-03-17 DIAGNOSIS — F329 Major depressive disorder, single episode, unspecified: Secondary | ICD-10-CM

## 2016-03-17 DIAGNOSIS — D751 Secondary polycythemia: Secondary | ICD-10-CM | POA: Diagnosis not present

## 2016-03-17 DIAGNOSIS — M180 Bilateral primary osteoarthritis of first carpometacarpal joints: Secondary | ICD-10-CM

## 2016-03-17 DIAGNOSIS — E785 Hyperlipidemia, unspecified: Secondary | ICD-10-CM | POA: Diagnosis not present

## 2016-03-17 DIAGNOSIS — F1011 Alcohol abuse, in remission: Secondary | ICD-10-CM

## 2016-03-17 MED ORDER — NABUMETONE 750 MG PO TABS: 750.0000 mg | ORAL_TABLET | Freq: Two times a day (BID) | ORAL | 5 refills | Status: DC | PRN

## 2016-03-17 MED ORDER — LOSARTAN POTASSIUM 100 MG PO TABS: 100.0000 mg | ORAL_TABLET | Freq: Every day | ORAL | 3 refills | Status: DC

## 2016-03-17 MED ORDER — VITAMIN D3 50 MCG (2000 UT) PO CAPS: 2000.0000 [IU] | ORAL_CAPSULE | Freq: Every day | ORAL | 3 refills | Status: DC

## 2016-03-17 MED ORDER — ZOLPIDEM TARTRATE 10 MG PO TABS: ORAL_TABLET | ORAL | 5 refills | Status: DC

## 2016-03-17 NOTE — Progress Notes (Signed)
Pre visit review using our clinic review tool, if applicable. No additional management support is needed unless otherwise documented below in the visit note. 

## 2016-03-17 NOTE — Progress Notes (Signed)
Subjective:  Patient ID: Jonathon Griffin, male    DOB: June 07, 1955  Age: 60 y.o. MRN: [id number]  CC: No chief complaint on file.   HPI Jonathon Griffin presents for OA - stiff hands, knees hurt; insomnia and HTN f/u. No alcohol. No CTS sx's  Outpatient Medications Prior to Visit  Medication Sig Dispense Refill  . losartan (COZAAR) 100 MG tablet TAKE 1 TABLET EVERY DAY 90 tablet 0  . zolpidem (AMBIEN) 10 MG tablet TAKE 1 TABLET BY MOUTH AT BEDTIME AS NEEDED 30 tablet 5  . levofloxacin (LEVAQUIN) 500 MG tablet Take 1 tablet (500 mg total) by mouth daily. 7 tablet 0  . losartan (COZAAR) 100 MG tablet TAKE 1 TABLET (100 MG TOTAL) BY MOUTH DAILY. YEARLY PHYSICAL W/LABS ARE DUE MUST SEE MD FOR REFILLS 30 tablet 0  . predniSONE (DELTASONE) 20 MG tablet Take 1 tablet (20 mg total) by mouth daily with breakfast. 10 tablet 0  . albuterol (PROVENTIL HFA;VENTOLIN HFA) 108 (90 BASE) MCG/ACT inhaler Inhale 2 puffs into the lungs every 6 (six) hours as needed for wheezing or shortness of breath. (Patient not taking: Reported on 03/17/2016) 1 Inhaler 0  . naproxen (NAPROSYN) 500 MG tablet Take 1 tablet (500 mg total) by mouth 2 (two) times daily with a meal. (Patient not taking: Reported on 03/17/2016) 60 tablet 0  . promethazine-codeine (PHENERGAN WITH CODEINE) 6.25-10 MG/5ML syrup Take 5 mLs by mouth every 4 (four) hours as needed. (Patient not taking: Reported on 03/17/2016) 300 mL 0   No facility-administered medications prior to visit.     ROS Review of Systems  Constitutional: Negative for appetite change, fatigue and unexpected weight change.  HENT: Negative for congestion, nosebleeds, sneezing, sore throat and trouble swallowing.   Eyes: Negative for itching and visual disturbance.  Respiratory: Negative for cough.   Cardiovascular: Negative for chest pain, palpitations and leg swelling.  Gastrointestinal: Negative for abdominal distention, blood in stool, diarrhea and nausea.  Genitourinary:  Negative for frequency and hematuria.  Musculoskeletal: Positive for arthralgias, back pain, joint swelling and neck pain. Negative for gait problem.  Skin: Negative for rash.  Neurological: Negative for dizziness, tremors, speech difficulty and weakness.  Psychiatric/Behavioral: Negative for agitation, dysphoric mood and sleep disturbance. The patient is not nervous/anxious.     Objective:  BP 140/82   Pulse 84   Wt 222 lb (100.7 kg)   SpO2 96%   BMI 31.85 kg/m   BP Readings from Last 3 Encounters:  03/17/16 140/82  08/20/14 138/82  07/07/14 (!) 168/100    Wt Readings from Last 3 Encounters:  03/17/16 222 lb (100.7 kg)  08/20/14 223 lb 6.4 oz (101.3 kg)  07/07/14 224 lb (101.6 kg)    Physical Exam  Constitutional: He is oriented to person, place, and time. He appears well-developed. No distress.  NAD  HENT:  Mouth/Throat: Oropharynx is clear and moist.  Eyes: Conjunctivae are normal. Pupils are equal, round, and reactive to light.  Neck: Normal range of motion. No JVD present. No thyromegaly present.  Cardiovascular: Normal rate, regular rhythm, normal heart sounds and intact distal pulses.  Exam reveals no gallop and no friction rub.   No murmur heard. Pulmonary/Chest: Effort normal and breath sounds normal. No respiratory distress. He has no wheezes. He has no rales. He exhibits no tenderness.  Abdominal: Soft. Bowel sounds are normal. He exhibits no distension and no mass. There is no tenderness. There is no rebound and no guarding.  Musculoskeletal: Normal  range of motion. He exhibits tenderness. He exhibits no edema.  Lymphadenopathy:    He has no cervical adenopathy.  Neurological: He is alert and oriented to person, place, and time. He has normal reflexes. No cranial nerve deficit. He exhibits normal muscle tone. He displays a negative Romberg sign. Coordination and gait normal.  Skin: Skin is warm and dry. No rash noted.  Psychiatric: He has a normal mood and  affect. His behavior is normal. Judgment and thought content normal.  stiff hands, fingers w/OA deformities No uvula    Lab Results  Component Value Date   WBC 6.6 03/12/2009   HGB 20.2 H g/dL (H) 16/02/9603   HCT 54.0 H % (H) 03/12/2009   PLT 223.0 03/12/2009   GLUCOSE 87 03/12/2009   CHOL 296 (H) 03/12/2009   TRIG 224.0 (H) 03/12/2009   HDL 26.90 (L) 03/12/2009   LDLDIRECT 228.0 03/12/2009   ALT 68 (H) 03/12/2009   AST 47 (H) 03/12/2009   NA 141 03/12/2009   K 4.5 03/12/2009   CL 102 03/12/2009   CREATININE 1.4 03/12/2009   BUN 16 03/12/2009   CO2 31 03/12/2009   TSH 1.60 03/12/2009   PSA 0.97 03/12/2009    Dg Chest 2 View  Result Date: 07/07/2014 CLINICAL DATA:  Patient with cough for 2 months. History of welding. EXAM: CHEST  2 VIEW COMPARISON:  02/15/2009 FINDINGS: Anterior cervical spinal fusion hardware. Stable cardiac and mediastinal contours. Mild tortuosity of the thoracic aorta. No consolidative pulmonary opacities. No pleural effusion or pneumothorax. Mid thoracic spine degenerative changes. IMPRESSION: No acute cardiopulmonary process. Electronically Signed   By: Annia Belt M.D.   On: 07/07/2014 13:48    Assessment & Plan:   Diagnoses and all orders for this visit:  Insomnia, unspecified type -     zolpidem (AMBIEN) 10 MG tablet; TAKE 1 TABLET BY MOUTH AT BEDTIME AS NEEDED  Other orders -     losartan (COZAAR) 100 MG tablet; Take 1 tablet (100 mg total) by mouth daily.   I have discontinued Mr. Bramble's levofloxacin and predniSONE. I am also having him maintain his zolpidem, naproxen, promethazine-codeine, albuterol, and losartan.  No orders of the defined types were placed in this encounter.    Follow-up: No Follow-up on file.  Sonda Primes, MD

## 2016-03-17 NOTE — Addendum Note (Signed)
Addended by: Merrilyn PumaSIMMONS, Orlandis Sanden N on: 03/17/2016 11:27 AM   Modules accepted: Orders

## 2016-03-31 ENCOUNTER — Other Ambulatory Visit (INDEPENDENT_AMBULATORY_CARE_PROVIDER_SITE_OTHER): Payer: 59

## 2016-03-31 ENCOUNTER — Telehealth: Payer: Self-pay | Admitting: Emergency Medicine

## 2016-03-31 DIAGNOSIS — G4700 Insomnia, unspecified: Secondary | ICD-10-CM

## 2016-03-31 DIAGNOSIS — E785 Hyperlipidemia, unspecified: Secondary | ICD-10-CM | POA: Diagnosis not present

## 2016-03-31 DIAGNOSIS — D751 Secondary polycythemia: Secondary | ICD-10-CM

## 2016-03-31 DIAGNOSIS — I10 Essential (primary) hypertension: Secondary | ICD-10-CM

## 2016-03-31 LAB — URINALYSIS, ROUTINE W REFLEX MICROSCOPIC
BILIRUBIN URINE: NEGATIVE
KETONES UR: NEGATIVE
LEUKOCYTES UA: NEGATIVE
NITRITE: NEGATIVE
PH: 5.5 (ref 5.0–8.0)
Specific Gravity, Urine: 1.015 (ref 1.000–1.030)
TOTAL PROTEIN, URINE-UPE24: NEGATIVE
UROBILINOGEN UA: 0.2 (ref 0.0–1.0)
Urine Glucose: NEGATIVE

## 2016-03-31 LAB — CBC WITH DIFFERENTIAL/PLATELET
BASOS ABS: 0.1 10*3/uL (ref 0.0–0.1)
BASOS PCT: 1 % (ref 0.0–3.0)
EOS PCT: 4.5 % (ref 0.0–5.0)
Eosinophils Absolute: 0.3 10*3/uL (ref 0.0–0.7)
HEMATOCRIT: 58.5 % — AB (ref 39.0–52.0)
Hemoglobin: 19.7 g/dL (ref 13.0–17.0)
LYMPHS ABS: 1.5 10*3/uL (ref 0.7–4.0)
LYMPHS PCT: 22.7 % (ref 12.0–46.0)
MCHC: 33.6 g/dL (ref 30.0–36.0)
MCV: 91.9 fl (ref 78.0–100.0)
MONOS PCT: 8.9 % (ref 3.0–12.0)
Monocytes Absolute: 0.6 10*3/uL (ref 0.1–1.0)
NEUTROS ABS: 4.2 10*3/uL (ref 1.4–7.7)
NEUTROS PCT: 62.9 % (ref 43.0–77.0)
PLATELETS: 183 10*3/uL (ref 150.0–400.0)
RBC: 6.36 Mil/uL — ABNORMAL HIGH (ref 4.22–5.81)
RDW: 14.3 % (ref 11.5–15.5)
WBC: 6.6 10*3/uL (ref 4.0–10.5)

## 2016-03-31 LAB — LIPID PANEL
CHOL/HDL RATIO: 6
Cholesterol: 220 mg/dL — ABNORMAL HIGH (ref 0–200)
HDL: 34.8 mg/dL — AB (ref >39.00)
LDL Cholesterol: 152 mg/dL — ABNORMAL HIGH (ref 0–99)
NONHDL: 184.8
Triglycerides: 162 mg/dL — ABNORMAL HIGH (ref 0.0–149.0)
VLDL: 32.4 mg/dL (ref 0.0–40.0)

## 2016-03-31 LAB — HEPATIC FUNCTION PANEL
ALK PHOS: 44 U/L (ref 39–117)
ALT: 40 U/L (ref 0–53)
AST: 33 U/L (ref 0–37)
Albumin: 4.3 g/dL (ref 3.5–5.2)
BILIRUBIN DIRECT: 0.1 mg/dL (ref 0.0–0.3)
BILIRUBIN TOTAL: 0.7 mg/dL (ref 0.2–1.2)
Total Protein: 7 g/dL (ref 6.0–8.3)

## 2016-03-31 LAB — BASIC METABOLIC PANEL
BUN: 19 mg/dL (ref 6–23)
CALCIUM: 10 mg/dL (ref 8.4–10.5)
CHLORIDE: 103 meq/L (ref 96–112)
CO2: 27 meq/L (ref 19–32)
CREATININE: 1.36 mg/dL (ref 0.40–1.50)
GFR: 57.09 mL/min — ABNORMAL LOW (ref >60.00)
Glucose, Bld: 99 mg/dL (ref 70–99)
Potassium: 4.8 mEq/L (ref 3.5–5.1)
SODIUM: 137 meq/L (ref 135–145)

## 2016-03-31 LAB — URIC ACID: URIC ACID, SERUM: 6.5 mg/dL (ref 4.0–7.8)

## 2016-03-31 LAB — TSH: TSH: 1.81 u[IU]/mL (ref 0.35–4.50)

## 2016-03-31 LAB — PSA: PSA: 1.83 ng/mL (ref 0.10–4.00)

## 2016-03-31 LAB — SEDIMENTATION RATE: Sed Rate: 25 mm/hr — ABNORMAL HIGH (ref 0–20)

## 2016-03-31 MED ORDER — ASPIRIN EC 81 MG PO TBEC: 81.0000 mg | DELAYED_RELEASE_TABLET | Freq: Every day | ORAL | 3 refills | Status: AC

## 2016-03-31 NOTE — Telephone Encounter (Signed)
Received critical lab value from Lab: [facility] - 19.7

## 2016-03-31 NOTE — Assessment & Plan Note (Signed)
Zolpidem prn 

## 2016-03-31 NOTE — Assessment & Plan Note (Signed)
Losartan Labs 

## 2016-03-31 NOTE — Addendum Note (Signed)
Addended by: Tresa GarterPLOTNIKOV, ALEKSEI V on: 03/31/2016 01:16 PM   Modules accepted: Orders

## 2016-03-31 NOTE — Assessment & Plan Note (Signed)
Worse Labs 

## 2016-03-31 NOTE — Assessment & Plan Note (Signed)
Doing fair 

## 2016-03-31 NOTE — Assessment & Plan Note (Addendum)
?  etiology CBC Start phlebotomies ASA

## 2016-03-31 NOTE — Assessment & Plan Note (Signed)
Not drinking 

## 2016-04-03 ENCOUNTER — Telehealth: Payer: Self-pay

## 2016-04-03 LAB — RHEUMATOID FACTOR

## 2016-04-03 NOTE — Telephone Encounter (Signed)
Patient will start phlebotomies every 8 weeks, orders are entered by dr plotnikov---patient already takes 81mg  aspirin daily---patient had uvula remvd in 2010 due to sleep apnea, and sleeps fine now---routing to dr Posey Reaplotnikov, fyi...thanks

## 2016-04-04 NOTE — Telephone Encounter (Signed)
Noted Take ASA 162 mg/d He may still have OSA - he needs to see a sleep med doctor Thx

## 2016-04-04 NOTE — Telephone Encounter (Signed)
OV this mo w/repeat CBC pls Thx

## 2016-04-05 NOTE — Addendum Note (Signed)
Addended by: Merrilyn PumaSIMMONS, Trynity Skousen N on: 04/05/2016 03:59 PM   Modules accepted: Orders

## 2016-04-05 NOTE — Telephone Encounter (Signed)
Pt informed

## 2016-04-07 ENCOUNTER — Other Ambulatory Visit: Payer: 59

## 2016-04-07 DIAGNOSIS — D751 Secondary polycythemia: Secondary | ICD-10-CM

## 2016-04-26 ENCOUNTER — Other Ambulatory Visit (INDEPENDENT_AMBULATORY_CARE_PROVIDER_SITE_OTHER): Payer: 59

## 2016-04-26 DIAGNOSIS — D751 Secondary polycythemia: Secondary | ICD-10-CM

## 2016-04-26 LAB — CBC WITH DIFFERENTIAL/PLATELET
BASOS ABS: 0.1 10*3/uL (ref 0.0–0.1)
Basophils Relative: 0.8 % (ref 0.0–3.0)
Eosinophils Absolute: 0.4 10*3/uL (ref 0.0–0.7)
Eosinophils Relative: 4.3 % (ref 0.0–5.0)
HCT: 51.5 % (ref 39.0–52.0)
Hemoglobin: 17.7 g/dL — ABNORMAL HIGH (ref 13.0–17.0)
LYMPHS ABS: 2.2 10*3/uL (ref 0.7–4.0)
Lymphocytes Relative: 25.9 % (ref 12.0–46.0)
MCHC: 34.3 g/dL (ref 30.0–36.0)
MCV: 90.6 fl (ref 78.0–100.0)
MONO ABS: 0.8 10*3/uL (ref 0.1–1.0)
MONOS PCT: 9.7 % (ref 3.0–12.0)
NEUTROS ABS: 5 10*3/uL (ref 1.4–7.7)
NEUTROS PCT: 59.3 % (ref 43.0–77.0)
PLATELETS: 213 10*3/uL (ref 150.0–400.0)
RBC: 5.69 Mil/uL (ref 4.22–5.81)
RDW: 13.2 % (ref 11.5–15.5)
WBC: 8.4 10*3/uL (ref 4.0–10.5)

## 2016-04-28 ENCOUNTER — Encounter: Payer: Self-pay | Admitting: Internal Medicine

## 2016-04-28 ENCOUNTER — Other Ambulatory Visit: Payer: 59

## 2016-04-28 ENCOUNTER — Ambulatory Visit (INDEPENDENT_AMBULATORY_CARE_PROVIDER_SITE_OTHER): Payer: 59 | Admitting: Internal Medicine

## 2016-04-28 VITALS — BP 150/90 | HR 84 | Wt 220.0 lb

## 2016-04-28 DIAGNOSIS — F1011 Alcohol abuse, in remission: Secondary | ICD-10-CM | POA: Diagnosis not present

## 2016-04-28 DIAGNOSIS — M180 Bilateral primary osteoarthritis of first carpometacarpal joints: Secondary | ICD-10-CM | POA: Diagnosis not present

## 2016-04-28 DIAGNOSIS — I10 Essential (primary) hypertension: Secondary | ICD-10-CM | POA: Diagnosis not present

## 2016-04-28 DIAGNOSIS — D751 Secondary polycythemia: Secondary | ICD-10-CM

## 2016-04-28 MED ORDER — ASPIRIN-ACETAMINOPHEN-CAFFEINE 500-325-65 MG PO PACK: PACK | ORAL | 0 refills | Status: DC

## 2016-04-28 MED ORDER — TRAMADOL HCL 50 MG PO TABS: 50.0000 mg | ORAL_TABLET | Freq: Four times a day (QID) | ORAL | 2 refills | Status: DC | PRN

## 2016-04-28 NOTE — Assessment & Plan Note (Signed)
BP Readings from Last 3 Encounters:  04/28/16 (!) 150/90  03/17/16 140/82  08/20/14 138/82

## 2016-04-28 NOTE — Progress Notes (Signed)
Pre visit review using our clinic review tool, if applicable. No additional management support is needed unless otherwise documented below in the visit note. 

## 2016-04-28 NOTE — Assessment & Plan Note (Signed)
No OSA 

## 2016-04-28 NOTE — Progress Notes (Signed)
Subjective:  Patient ID: Jonathon Griffin, male    DOB: February 19, 1956  Age: 60 y.o. MRN: [id number]  CC: No chief complaint on file.   HPI Jonathon Griffin presents for hands and knees pain and elevated Hgb. Relafen is not helping that much. He is taking Goody's 2 a day  Outpatient Medications Prior to Visit  Medication Sig Dispense Refill  . aspirin EC 81 MG tablet Take 1 tablet (81 mg total) by mouth daily. 100 tablet 3  . Cholecalciferol (VITAMIN D3) 2000 units capsule Take 1 capsule (2,000 Units total) by mouth daily. 100 capsule 3  . losartan (COZAAR) 100 MG tablet Take 1 tablet (100 mg total) by mouth daily. 90 tablet 3  . nabumetone (RELAFEN) 750 MG tablet Take 1 tablet (750 mg total) by mouth 2 (two) times daily as needed. 60 tablet 5  . zolpidem (AMBIEN) 10 MG tablet TAKE 1 TABLET BY MOUTH AT BEDTIME AS NEEDED 30 tablet 5   No facility-administered medications prior to visit.     ROS Review of Systems  Constitutional: Negative for appetite change, fatigue and unexpected weight change.  HENT: Negative for congestion, nosebleeds, sneezing, sore throat and trouble swallowing.   Eyes: Negative for itching and visual disturbance.  Respiratory: Negative for cough.   Cardiovascular: Negative for chest pain, palpitations and leg swelling.  Gastrointestinal: Negative for abdominal distention, blood in stool, diarrhea and nausea.  Genitourinary: Negative for frequency and hematuria.  Musculoskeletal: Positive for arthralgias and back pain. Negative for gait problem, joint swelling and neck pain.  Skin: Negative for rash.  Neurological: Negative for dizziness, tremors, speech difficulty and weakness.  Psychiatric/Behavioral: Negative for agitation, dysphoric mood and sleep disturbance. The patient is not nervous/anxious.     Objective:  BP (!) 150/90   Pulse 84   Wt 220 lb (99.8 kg)   SpO2 96%   BMI 31.57 kg/m   BP Readings from Last 3 Encounters:  04/28/16 (!) 150/90    03/17/16 140/82  08/20/14 138/82    Wt Readings from Last 3 Encounters:  04/28/16 220 lb (99.8 kg)  03/17/16 222 lb (100.7 kg)  08/20/14 223 lb 6.4 oz (101.3 kg)    Physical Exam  Constitutional: He is oriented to person, place, and time. He appears well-developed. No distress.  NAD  HENT:  Mouth/Throat: Oropharynx is clear and moist.  Eyes: Conjunctivae are normal. Pupils are equal, round, and reactive to light.  Neck: Normal range of motion. No JVD present. No thyromegaly present.  Cardiovascular: Normal rate, regular rhythm, normal heart sounds and intact distal pulses.  Exam reveals no gallop and no friction rub.   No murmur heard. Pulmonary/Chest: Effort normal and breath sounds normal. No respiratory distress. He has no wheezes. He has no rales. He exhibits no tenderness.  Abdominal: Soft. Bowel sounds are normal. He exhibits no distension and no mass. There is no tenderness. There is no rebound and no guarding.  Musculoskeletal: Normal range of motion. He exhibits tenderness. He exhibits no edema.  Lymphadenopathy:    He has no cervical adenopathy.  Neurological: He is alert and oriented to person, place, and time. He has normal reflexes. No cranial nerve deficit. He exhibits normal muscle tone. He displays a negative Romberg sign. Coordination and gait normal.  Skin: Skin is warm and dry. No rash noted.  Psychiatric: He has a normal mood and affect. His behavior is normal. Judgment and thought content normal.  hands tender Hyperemic face color  Lab Results  Component Value Date   WBC 8.4 04/26/2016   HGB 17.7 (H) 04/26/2016   HCT 51.5 04/26/2016   PLT 213.0 04/26/2016   GLUCOSE 99 03/31/2016   CHOL 220 (H) 03/31/2016   TRIG 162.0 (H) 03/31/2016   HDL 34.80 (L) 03/31/2016   LDLDIRECT 228.0 03/12/2009   LDLCALC 152 (H) 03/31/2016   ALT 40 03/31/2016   AST 33 03/31/2016   NA 137 03/31/2016   K 4.8 03/31/2016   CL 103 03/31/2016   CREATININE 1.36 03/31/2016   BUN  19 03/31/2016   CO2 27 03/31/2016   TSH 1.81 03/31/2016   PSA 1.83 03/31/2016    Dg Chest 2 View  Result Date: 07/07/2014 CLINICAL DATA:  Patient with cough for 2 months. History of welding. EXAM: CHEST  2 VIEW COMPARISON:  02/15/2009 FINDINGS: Anterior cervical spinal fusion hardware. Stable cardiac and mediastinal contours. Mild tortuosity of the thoracic aorta. No consolidative pulmonary opacities. No pleural effusion or pneumothorax. Mid thoracic spine degenerative changes. IMPRESSION: No acute cardiopulmonary process. Electronically Signed   By: Annia Beltrew  Davis M.D.   On: 07/07/2014 13:48    Assessment & Plan:   There are no diagnoses linked to this encounter. I am having Mr. Sterling maintain his losartan, zolpidem, Vitamin D3, nabumetone, and aspirin EC.  No orders of the defined types were placed in this encounter.    Follow-up: No Follow-up on file.  Sonda PrimesAlex Violet Cart, MD

## 2016-04-28 NOTE — Assessment & Plan Note (Signed)
He is taking Goody's 2 a day D/c Relafen Tramadol prn  Potential benefits of a long term opioids use as well as potential risks (i.e. addiction risk, apnea etc) and complications (i.e. Somnolence, constipation and others) were explained to the patient and were aknowledged.

## 2016-04-28 NOTE — Assessment & Plan Note (Signed)
Not drinking 

## 2016-06-16 ENCOUNTER — Ambulatory Visit: Payer: Self-pay | Admitting: Internal Medicine

## 2016-08-04 ENCOUNTER — Encounter: Payer: Self-pay | Admitting: Internal Medicine

## 2016-08-04 ENCOUNTER — Ambulatory Visit (INDEPENDENT_AMBULATORY_CARE_PROVIDER_SITE_OTHER): Payer: 59 | Admitting: Internal Medicine

## 2016-08-04 ENCOUNTER — Other Ambulatory Visit (INDEPENDENT_AMBULATORY_CARE_PROVIDER_SITE_OTHER): Payer: 59

## 2016-08-04 DIAGNOSIS — R7401 Elevation of levels of liver transaminase levels: Secondary | ICD-10-CM

## 2016-08-04 DIAGNOSIS — I10 Essential (primary) hypertension: Secondary | ICD-10-CM

## 2016-08-04 DIAGNOSIS — R7989 Other specified abnormal findings of blood chemistry: Secondary | ICD-10-CM

## 2016-08-04 DIAGNOSIS — G4700 Insomnia, unspecified: Secondary | ICD-10-CM | POA: Diagnosis not present

## 2016-08-04 DIAGNOSIS — M180 Bilateral primary osteoarthritis of first carpometacarpal joints: Secondary | ICD-10-CM

## 2016-08-04 DIAGNOSIS — R740 Nonspecific elevation of levels of transaminase and lactic acid dehydrogenase [LDH]: Secondary | ICD-10-CM | POA: Diagnosis not present

## 2016-08-04 LAB — BASIC METABOLIC PANEL
BUN: 19 mg/dL (ref 6–23)
CHLORIDE: 102 meq/L (ref 96–112)
CO2: 25 meq/L (ref 19–32)
Calcium: 10 mg/dL (ref 8.4–10.5)
Creatinine, Ser: 1.31 mg/dL (ref 0.40–1.50)
GFR: 59.54 mL/min — ABNORMAL LOW (ref >60.00)
Glucose, Bld: 83 mg/dL (ref 70–99)
POTASSIUM: 4 meq/L (ref 3.5–5.1)
SODIUM: 134 meq/L — AB (ref 135–145)

## 2016-08-04 LAB — CBC WITH DIFFERENTIAL/PLATELET
BASOS ABS: 0.1 10*3/uL (ref 0.0–0.1)
BASOS PCT: 1 % (ref 0.0–3.0)
EOS PCT: 4 % (ref 0.0–5.0)
Eosinophils Absolute: 0.4 10*3/uL (ref 0.0–0.7)
HEMATOCRIT: 52.2 % — AB (ref 39.0–52.0)
Hemoglobin: 17.4 g/dL — ABNORMAL HIGH (ref 13.0–17.0)
LYMPHS PCT: 22.1 % (ref 12.0–46.0)
Lymphs Abs: 2 10*3/uL (ref 0.7–4.0)
MCHC: 33.2 g/dL (ref 30.0–36.0)
MCV: 95.4 fl (ref 78.0–100.0)
MONOS PCT: 10.7 % (ref 3.0–12.0)
Monocytes Absolute: 1 10*3/uL (ref 0.1–1.0)
NEUTROS ABS: 5.7 10*3/uL (ref 1.4–7.7)
Neutrophils Relative %: 62.2 % (ref 43.0–77.0)
PLATELETS: 217 10*3/uL (ref 150.0–400.0)
RBC: 5.47 Mil/uL (ref 4.22–5.81)
RDW: 14.9 % (ref 11.5–15.5)
WBC: 9.1 10*3/uL (ref 4.0–10.5)

## 2016-08-04 LAB — HEPATIC FUNCTION PANEL
ALK PHOS: 39 U/L (ref 39–117)
ALT: 27 U/L (ref 0–53)
AST: 27 U/L (ref 0–37)
Albumin: 3.9 g/dL (ref 3.5–5.2)
Bilirubin, Direct: 0.1 mg/dL (ref 0.0–0.3)
Total Bilirubin: 0.3 mg/dL (ref 0.2–1.2)
Total Protein: 6.8 g/dL (ref 6.0–8.3)

## 2016-08-04 MED ORDER — TRAMADOL HCL 50 MG PO TABS: 50.0000 mg | ORAL_TABLET | Freq: Four times a day (QID) | ORAL | 2 refills | Status: DC | PRN

## 2016-08-04 MED ORDER — ZOLPIDEM TARTRATE 10 MG PO TABS: ORAL_TABLET | ORAL | 5 refills | Status: DC

## 2016-08-04 NOTE — Assessment & Plan Note (Signed)
CBC

## 2016-08-04 NOTE — Assessment & Plan Note (Signed)
Hands>knees Tramadol He'll see Dr Herbert [facility]eta[facility]

## 2016-08-04 NOTE — Assessment & Plan Note (Signed)
LFT

## 2016-08-04 NOTE — Progress Notes (Signed)
Subjective:  Patient ID: Jonathon Griffin, male    DOB: 1956/01/20  Age: 61 y.o. MRN: [id number]  CC: Follow-up (HTN, Depression, Hyperlipidemia, insomnia, )   HPI Jonathon Griffin presents for HTN, OA Tramadol helps  Outpatient Medications Prior to Visit  Medication Sig Dispense Refill  . aspirin EC 81 MG tablet Take 1 tablet (81 mg total) by mouth daily. 100 tablet 3  . Aspirin-Acetaminophen-Caffeine (GOODYS EXTRA STRENGTH) 500-325-65 MG PACK 1 bid pc 56 each 0  . Cholecalciferol (VITAMIN D3) 2000 units capsule Take 1 capsule (2,000 Units total) by mouth daily. 100 capsule 3  . losartan (COZAAR) 100 MG tablet Take 1 tablet (100 mg total) by mouth daily. 90 tablet 3  . traMADol (ULTRAM) 50 MG tablet Take 1 tablet (50 mg total) by mouth every 6 (six) hours as needed. 120 tablet 2  . zolpidem (AMBIEN) 10 MG tablet TAKE 1 TABLET BY MOUTH AT BEDTIME AS NEEDED 30 tablet 5   No facility-administered medications prior to visit.     ROS Review of Systems  Constitutional: Negative for appetite change, fatigue and unexpected weight change.  HENT: Negative for congestion, nosebleeds, sneezing, sore throat and trouble swallowing.   Eyes: Negative for itching and visual disturbance.  Respiratory: Negative for cough.   Cardiovascular: Negative for chest pain, palpitations and leg swelling.  Gastrointestinal: Negative for abdominal distention, blood in stool, diarrhea and nausea.  Genitourinary: Negative for frequency and hematuria.  Musculoskeletal: Positive for arthralgias. Negative for back pain, gait problem, joint swelling and neck pain.  Skin: Negative for rash.  Neurological: Negative for dizziness, tremors, speech difficulty and weakness.  Psychiatric/Behavioral: Positive for sleep disturbance. Negative for agitation and dysphoric mood. The patient is not nervous/anxious.     Objective:  BP (!) 146/96   Pulse 84   Temp 98.4 F (36.9 C) (Oral)   Resp 16   Ht 5\' 10"  (1.778 m)   Wt  222 lb 4 oz (100.8 kg)   SpO2 98%   BMI 31.89 kg/m   BP Readings from Last 3 Encounters:  08/04/16 (!) 146/96  04/28/16 (!) 150/90  03/17/16 140/82    Wt Readings from Last 3 Encounters:  08/04/16 222 lb 4 oz (100.8 kg)  04/28/16 220 lb (99.8 kg)  03/17/16 222 lb (100.7 kg)    Physical Exam  Constitutional: He is oriented to person, place, and time. He appears well-developed. No distress.  NAD  HENT:  Mouth/Throat: Oropharynx is clear and moist.  Eyes: Conjunctivae are normal. Pupils are equal, round, and reactive to light.  Neck: Normal range of motion. No JVD present. No thyromegaly present.  Cardiovascular: Normal rate, regular rhythm, normal heart sounds and intact distal pulses.  Exam reveals no gallop and no friction rub.   No murmur heard. Pulmonary/Chest: Effort normal and breath sounds normal. No respiratory distress. He has no wheezes. He has no rales. He exhibits no tenderness.  Abdominal: Soft. Bowel sounds are normal. He exhibits no distension and no mass. There is no tenderness. There is no rebound and no guarding.  Musculoskeletal: Normal range of motion. He exhibits no edema or tenderness.  Lymphadenopathy:    He has no cervical adenopathy.  Neurological: He is alert and oriented to person, place, and time. He has normal reflexes. No cranial nerve deficit. He exhibits normal muscle tone. He displays a negative Romberg sign. Coordination and gait normal.  Skin: Skin is warm and dry. No rash noted.  Psychiatric: He has a normal  mood and affect. His behavior is normal. Judgment and thought content normal.  OA changes - hands Ruddy complexion  Lab Results  Component Value Date   WBC 8.4 04/26/2016   HGB 17.7 (H) 04/26/2016   HCT 51.5 04/26/2016   PLT 213.0 04/26/2016   GLUCOSE 99 03/31/2016   CHOL 220 (H) 03/31/2016   TRIG 162.0 (H) 03/31/2016   HDL 34.80 (L) 03/31/2016   LDLDIRECT 228.0 03/12/2009   LDLCALC 152 (H) 03/31/2016   ALT 40 03/31/2016   AST 33  03/31/2016   NA 137 03/31/2016   K 4.8 03/31/2016   CL 103 03/31/2016   CREATININE 1.36 03/31/2016   BUN 19 03/31/2016   CO2 27 03/31/2016   TSH 1.81 03/31/2016   PSA 1.83 03/31/2016    Dg Chest 2 View  Result Date: 07/07/2014 CLINICAL DATA:  Patient with cough for 2 months. History of welding. EXAM: CHEST  2 VIEW COMPARISON:  02/15/2009 FINDINGS: Anterior cervical spinal fusion hardware. Stable cardiac and mediastinal contours. Mild tortuosity of the thoracic aorta. No consolidative pulmonary opacities. No pleural effusion or pneumothorax. Mid thoracic spine degenerative changes. IMPRESSION: No acute cardiopulmonary process. Electronically Signed   By: Annia Beltrew  Davis M.D.   On: 07/07/2014 13:48    Assessment & Plan:   There are no diagnoses linked to this encounter. I am having Jonathon Griffin maintain his losartan, zolpidem, Vitamin D3, aspirin EC, Aspirin-Acetaminophen-Caffeine, and traMADol.  No orders of the defined types were placed in this encounter.    Follow-up: No Follow-up on file.  Sonda PrimesAlex Plotnikov, MD

## 2016-08-04 NOTE — Progress Notes (Signed)
Pre-visit discussion using our clinic review tool. No additional management support is needed unless otherwise documented below in the visit note.  

## 2016-08-04 NOTE — Assessment & Plan Note (Signed)
BP OK at home Losartan Lab

## 2016-08-11 ENCOUNTER — Other Ambulatory Visit (INDEPENDENT_AMBULATORY_CARE_PROVIDER_SITE_OTHER): Payer: 59

## 2016-08-11 ENCOUNTER — Telehealth: Payer: Self-pay

## 2016-08-11 DIAGNOSIS — D751 Secondary polycythemia: Secondary | ICD-10-CM | POA: Diagnosis not present

## 2016-08-11 LAB — CBC WITH DIFFERENTIAL/PLATELET
BASOS ABS: 0.1 10*3/uL (ref 0.0–0.1)
Basophils Relative: 0.9 % (ref 0.0–3.0)
Eosinophils Absolute: 0.4 10*3/uL (ref 0.0–0.7)
Eosinophils Relative: 4 % (ref 0.0–5.0)
HEMATOCRIT: 53.1 % — AB (ref 39.0–52.0)
HEMOGLOBIN: 17.7 g/dL — AB (ref 13.0–17.0)
LYMPHS PCT: 23.4 % (ref 12.0–46.0)
Lymphs Abs: 2.1 10*3/uL (ref 0.7–4.0)
MCHC: 33.3 g/dL (ref 30.0–36.0)
MCV: 94.4 fl (ref 78.0–100.0)
Monocytes Absolute: 0.7 10*3/uL (ref 0.1–1.0)
Monocytes Relative: 7.5 % (ref 3.0–12.0)
Neutro Abs: 5.8 10*3/uL (ref 1.4–7.7)
Neutrophils Relative %: 64.2 % (ref 43.0–77.0)
Platelets: 207 10*3/uL (ref 150.0–400.0)
RBC: 5.63 Mil/uL (ref 4.22–5.81)
RDW: 14.7 % (ref 11.5–15.5)
WBC: 9 10*3/uL (ref 4.0–10.5)

## 2016-08-11 NOTE — Telephone Encounter (Signed)
error 

## 2016-12-15 ENCOUNTER — Other Ambulatory Visit: Payer: Self-pay

## 2016-12-15 MED ORDER — LOSARTAN POTASSIUM 100 MG PO TABS: 100.0000 mg | ORAL_TABLET | Freq: Every day | ORAL | 0 refills | Status: DC

## 2017-02-02 ENCOUNTER — Ambulatory Visit: Payer: Self-pay | Admitting: Internal Medicine

## 2017-02-09 ENCOUNTER — Other Ambulatory Visit (INDEPENDENT_AMBULATORY_CARE_PROVIDER_SITE_OTHER): Payer: 59

## 2017-02-09 ENCOUNTER — Encounter: Payer: Self-pay | Admitting: Internal Medicine

## 2017-02-09 ENCOUNTER — Other Ambulatory Visit: Payer: Self-pay

## 2017-02-09 ENCOUNTER — Ambulatory Visit (INDEPENDENT_AMBULATORY_CARE_PROVIDER_SITE_OTHER): Payer: 59 | Admitting: Internal Medicine

## 2017-02-09 DIAGNOSIS — I10 Essential (primary) hypertension: Secondary | ICD-10-CM

## 2017-02-09 DIAGNOSIS — D751 Secondary polycythemia: Secondary | ICD-10-CM | POA: Diagnosis not present

## 2017-02-09 DIAGNOSIS — E785 Hyperlipidemia, unspecified: Secondary | ICD-10-CM

## 2017-02-09 DIAGNOSIS — G4700 Insomnia, unspecified: Secondary | ICD-10-CM

## 2017-02-09 LAB — CBC WITH DIFFERENTIAL/PLATELET
Basophils Absolute: 0.1 10*3/uL (ref 0.0–0.1)
Basophils Relative: 0.9 % (ref 0.0–3.0)
EOS ABS: 0.5 10*3/uL (ref 0.0–0.7)
EOS PCT: 4.7 % (ref 0.0–5.0)
HCT: 52.7 % — ABNORMAL HIGH (ref 39.0–52.0)
HEMOGLOBIN: 17.7 g/dL — AB (ref 13.0–17.0)
LYMPHS ABS: 2.2 10*3/uL (ref 0.7–4.0)
Lymphocytes Relative: 22.5 % (ref 12.0–46.0)
MCHC: 33.7 g/dL (ref 30.0–36.0)
MCV: 93.5 fl (ref 78.0–100.0)
MONO ABS: 0.9 10*3/uL (ref 0.1–1.0)
Monocytes Relative: 9.2 % (ref 3.0–12.0)
Neutro Abs: 6.2 10*3/uL (ref 1.4–7.7)
Neutrophils Relative %: 62.7 % (ref 43.0–77.0)
Platelets: 205 10*3/uL (ref 150.0–400.0)
RBC: 5.64 Mil/uL (ref 4.22–5.81)
RDW: 13.9 % (ref 11.5–15.5)
WBC: 9.9 10*3/uL (ref 4.0–10.5)

## 2017-02-09 MED ORDER — TRAMADOL HCL 50 MG PO TABS: 50.0000 mg | ORAL_TABLET | Freq: Four times a day (QID) | ORAL | 2 refills | Status: DC | PRN

## 2017-02-09 MED ORDER — ZOLPIDEM TARTRATE 10 MG PO TABS: ORAL_TABLET | ORAL | 5 refills | Status: DC

## 2017-02-09 NOTE — Assessment & Plan Note (Signed)
Good diet 

## 2017-02-09 NOTE — Assessment & Plan Note (Signed)
Tramadol prn Turmeric  Potential benefits of a long term opioids use as well as potential risks (i.e. addiction risk, apnea etc) and complications (i.e. Somnolence, constipation and others) were explained to the patient and w

## 2017-02-09 NOTE — Assessment & Plan Note (Signed)
CBC

## 2017-02-09 NOTE — Progress Notes (Signed)
Subjective:  Patient ID: Jonathon Griffin, male    DOB: 06/01/1955  Age: 61 y.o. MRN: [id number]  CC: No chief complaint on file.   HPI Jonathon Griffin presents for LBP, OA, insomnia f/u  Outpatient Medications Prior to Visit  Medication Sig Dispense Refill  . aspirin EC 81 MG tablet Take 1 tablet (81 mg total) by mouth daily. 100 tablet 3  . Aspirin-Acetaminophen-Caffeine (GOODYS EXTRA STRENGTH) 500-325-65 MG PACK 1 bid pc 56 each 0  . Cholecalciferol (VITAMIN D3) 2000 units capsule Take 1 capsule (2,000 Units total) by mouth daily. 100 capsule 3  . losartan (COZAAR) 100 MG tablet Take 1 tablet (100 mg total) by mouth daily. 90 tablet 0  . traMADol (ULTRAM) 50 MG tablet Take 1 tablet (50 mg total) by mouth every 6 (six) hours as needed. 120 tablet 2  . zolpidem (AMBIEN) 10 MG tablet TAKE 1 TABLET BY MOUTH AT BEDTIME AS NEEDED 30 tablet 5   No facility-administered medications prior to visit.     ROS Review of Systems  Constitutional: Negative for appetite change, fatigue and unexpected weight change.  HENT: Negative for congestion, nosebleeds, sneezing, sore throat and trouble swallowing.   Eyes: Negative for itching and visual disturbance.  Respiratory: Negative for cough.   Cardiovascular: Negative for chest pain, palpitations and leg swelling.  Gastrointestinal: Negative for abdominal distention, blood in stool, diarrhea and nausea.  Genitourinary: Negative for frequency and hematuria.  Musculoskeletal: Positive for arthralgias and back pain. Negative for gait problem, joint swelling and neck pain.  Skin: Negative for rash.  Neurological: Negative for dizziness, tremors, speech difficulty and weakness.  Psychiatric/Behavioral: Positive for sleep disturbance. Negative for agitation and dysphoric mood. The patient is not nervous/anxious.     Objective:  BP 138/86 (BP Location: Left Arm, Patient Position: Sitting, Cuff Size: Large)   Pulse (!) 102   Temp 98.5 F (36.9 C)  (Oral)   Ht  (1.778 m)   Wt 214 lb (97.1 kg)   SpO2 98%   BMI 30.71 kg/m   BP Readings from Last 3 Encounters:  02/09/17 138/86  08/04/16 (!) 146/96  04/28/16 (!) 150/90    Wt Readings from Last 3 Encounters:  02/09/17 214 lb (97.1 kg)  08/04/16 222 lb 4 oz (100.8 kg)  04/28/16 220 lb (99.8 kg)    Physical Exam  Constitutional: He is oriented to person, place, and time. He appears well-developed. No distress.  NAD  HENT:  Mouth/Throat: Oropharynx is clear and moist.  Eyes: Pupils are equal, round, and reactive to light. Conjunctivae are normal.  Neck: Normal range of motion. No JVD present. No thyromegaly present.  Cardiovascular: Normal rate, regular rhythm, normal heart sounds and intact distal pulses.  Exam reveals no gallop and no friction rub.   No murmur heard. Pulmonary/Chest: Effort normal and breath sounds normal. No respiratory distress. He has no wheezes. He has no rales. He exhibits no tenderness.  Abdominal: Soft. Bowel sounds are normal. He exhibits no distension and no mass. There is no tenderness. There is no rebound and no guarding.  Musculoskeletal: Normal range of motion. He exhibits tenderness. He exhibits no edema.  Lymphadenopathy:    He has no cervical adenopathy.  Neurological: He is alert and oriented to person, place, and time. He has normal reflexes. No cranial nerve deficit. He exhibits normal muscle tone. He displays a negative Romberg sign. Coordination and gait normal.  Skin: Skin is warm and dry. No rash noted.  Psychiatric: He has a normal mood and affect. His behavior is normal. Judgment and thought content normal.    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 EXAM: CHEST  2 VIEW COMPARISON:  02/15/2009 FINDINGS: Anterior cervical spinal fusion hardware. Stable cardiac and mediastinal contours. Mild tortuosity of the thoracic aorta. No consolidative pulmonary opacities. No pleural effusion or pneumothorax. Mid thoracic spine degenerative changes. IMPRESSION: No acute cardiopulmonary process. Electronically Signed   By: Annia Belt M.D.   On: 07/07/2014 13:48    Assessment & Plan:   There are no diagnoses linked to this encounter. I am having Jonathon Griffin maintain his Vitamin D3, aspirin EC, Aspirin-Acetaminophen-Caffeine, traMADol, zolpidem, and losartan.  No orders of the defined types were placed in this encounter.    Follow-up: No Follow-up on file.  Sonda Primes, MD

## 2017-02-09 NOTE — Assessment & Plan Note (Signed)
Losartan 

## 2017-02-09 NOTE — Assessment & Plan Note (Signed)
Zolpidem prn  Potential benefits of a long term benzodiazepines  use as well as potential risks  and complications were explained to the patient and were aknowledged. 

## 2017-04-14 ENCOUNTER — Other Ambulatory Visit: Payer: Self-pay | Admitting: Internal Medicine

## 2017-08-10 ENCOUNTER — Encounter: Payer: Self-pay | Admitting: Internal Medicine

## 2017-08-10 ENCOUNTER — Other Ambulatory Visit (INDEPENDENT_AMBULATORY_CARE_PROVIDER_SITE_OTHER): Payer: 59

## 2017-08-10 ENCOUNTER — Ambulatory Visit (INDEPENDENT_AMBULATORY_CARE_PROVIDER_SITE_OTHER): Payer: 59 | Admitting: Internal Medicine

## 2017-08-10 VITALS — BP 126/82 | HR 59 | Temp 98.0°F | Ht 70.0 in | Wt 212.0 lb

## 2017-08-10 DIAGNOSIS — Z0000 Encounter for general adult medical examination without abnormal findings: Secondary | ICD-10-CM | POA: Insufficient documentation

## 2017-08-10 DIAGNOSIS — E785 Hyperlipidemia, unspecified: Secondary | ICD-10-CM | POA: Diagnosis not present

## 2017-08-10 DIAGNOSIS — D751 Secondary polycythemia: Secondary | ICD-10-CM

## 2017-08-10 DIAGNOSIS — R7989 Other specified abnormal findings of blood chemistry: Secondary | ICD-10-CM

## 2017-08-10 DIAGNOSIS — I10 Essential (primary) hypertension: Secondary | ICD-10-CM

## 2017-08-10 DIAGNOSIS — Z0001 Encounter for general adult medical examination with abnormal findings: Secondary | ICD-10-CM

## 2017-08-10 DIAGNOSIS — B079 Viral wart, unspecified: Secondary | ICD-10-CM

## 2017-08-10 DIAGNOSIS — G4700 Insomnia, unspecified: Secondary | ICD-10-CM

## 2017-08-10 LAB — LIPID PANEL
CHOL/HDL RATIO: 6
CHOLESTEROL: 229 mg/dL — AB (ref 0–200)
HDL: 40.6 mg/dL (ref >39.00)
LDL CALC: 152 mg/dL — AB (ref 0–99)
NonHDL: 188.31
TRIGLYCERIDES: 180 mg/dL — AB (ref 0.0–149.0)
VLDL: 36 mg/dL (ref 0.0–40.0)

## 2017-08-10 LAB — CBC WITH DIFFERENTIAL/PLATELET
BASOS ABS: 0.1 10*3/uL (ref 0.0–0.1)
Basophils Relative: 0.8 % (ref 0.0–3.0)
EOS ABS: 0.3 10*3/uL (ref 0.0–0.7)
Eosinophils Relative: 3.3 % (ref 0.0–5.0)
HCT: 54.4 % — ABNORMAL HIGH (ref 39.0–52.0)
Hemoglobin: 18.6 g/dL (ref 13.0–17.0)
Lymphocytes Relative: 22.2 % (ref 12.0–46.0)
Lymphs Abs: 1.7 10*3/uL (ref 0.7–4.0)
MCHC: 34.1 g/dL (ref 30.0–36.0)
MCV: 92.8 fl (ref 78.0–100.0)
MONO ABS: 0.6 10*3/uL (ref 0.1–1.0)
Monocytes Relative: 7.6 % (ref 3.0–12.0)
NEUTROS ABS: 5 10*3/uL (ref 1.4–7.7)
Neutrophils Relative %: 66.1 % (ref 43.0–77.0)
PLATELETS: 198 10*3/uL (ref 150.0–400.0)
RBC: 5.87 Mil/uL — ABNORMAL HIGH (ref 4.22–5.81)
RDW: 14.1 % (ref 11.5–15.5)
WBC: 7.6 10*3/uL (ref 4.0–10.5)

## 2017-08-10 LAB — BASIC METABOLIC PANEL
BUN: 21 mg/dL (ref 6–23)
CHLORIDE: 103 meq/L (ref 96–112)
CO2: 27 mEq/L (ref 19–32)
Calcium: 10.6 mg/dL — ABNORMAL HIGH (ref 8.4–10.5)
Creatinine, Ser: 1.38 mg/dL (ref 0.40–1.50)
GFR: 55.87 mL/min — AB (ref >60.00)
Glucose, Bld: 85 mg/dL (ref 70–99)
POTASSIUM: 4.6 meq/L (ref 3.5–5.1)
SODIUM: 140 meq/L (ref 135–145)

## 2017-08-10 LAB — HEPATIC FUNCTION PANEL
ALT: 51 U/L (ref 0–53)
AST: 34 U/L (ref 0–37)
Albumin: 4.4 g/dL (ref 3.5–5.2)
Alkaline Phosphatase: 49 U/L (ref 39–117)
BILIRUBIN DIRECT: 0.2 mg/dL (ref 0.0–0.3)
TOTAL PROTEIN: 7.2 g/dL (ref 6.0–8.3)
Total Bilirubin: 0.9 mg/dL (ref 0.2–1.2)

## 2017-08-10 LAB — TSH: TSH: 1.28 u[IU]/mL (ref 0.35–4.50)

## 2017-08-10 LAB — URINALYSIS
Bilirubin Urine: NEGATIVE
Hgb urine dipstick: NEGATIVE
Ketones, ur: 15 — AB
Leukocytes, UA: NEGATIVE
NITRITE: NEGATIVE
SPECIFIC GRAVITY, URINE: 1.025 (ref 1.000–1.030)
URINE GLUCOSE: NEGATIVE
UROBILINOGEN UA: 0.2 (ref 0.0–1.0)
pH: 6 (ref 5.0–8.0)

## 2017-08-10 LAB — PSA: PSA: 2.87 ng/mL (ref 0.10–4.00)

## 2017-08-10 MED ORDER — ZOLPIDEM TARTRATE 10 MG PO TABS: ORAL_TABLET | ORAL | 5 refills | Status: DC

## 2017-08-10 MED ORDER — LOSARTAN POTASSIUM 100 MG PO TABS: 100.0000 mg | ORAL_TABLET | Freq: Every day | ORAL | 3 refills | Status: DC

## 2017-08-10 MED ORDER — TRAMADOL HCL 50 MG PO TABS: 50.0000 mg | ORAL_TABLET | Freq: Four times a day (QID) | ORAL | 3 refills | Status: DC | PRN

## 2017-08-10 NOTE — Assessment & Plan Note (Signed)
CBC

## 2017-08-10 NOTE — Addendum Note (Signed)
Addended by: Tresa GarterPLOTNIKOV, ALEKSEI V on: 08/10/2017 04:00 PM   Modules accepted: Level of Service

## 2017-08-10 NOTE — Assessment & Plan Note (Signed)
See Cryo 

## 2017-08-10 NOTE — Assessment & Plan Note (Signed)
We discussed age appropriate health related issues, including available/recomended screening tests and vaccinations. We discussed a need for adhering to healthy diet and exercise. Labs were ordered to be later reviewed . All questions were answered.   

## 2017-08-10 NOTE — Assessment & Plan Note (Signed)
Losartan 

## 2017-08-10 NOTE — Assessment & Plan Note (Signed)
Phlebotomy prn CBC

## 2017-08-10 NOTE — Patient Instructions (Addendum)
CBD oil for pain 

## 2017-08-10 NOTE — Progress Notes (Addendum)
Subjective:  Patient ID: Jonathon Griffin Neuroth, male    DOB: [birth year]  Age: 62 y.o. MRN: [id number]  CC: No chief complaint on file.   HPI Jonathon Griffin Blackson presents for well exam C/o warts  Outpatient Medications Prior to Visit  Medication Sig Dispense Refill  . Aspirin-Acetaminophen-Caffeine (GOODYS EXTRA STRENGTH) 500-325-65 MG PACK 1 bid pc 56 each 0  . Cholecalciferol (VITAMIN D3) 2000 units capsule Take 1 capsule (2,000 Units total) by mouth daily. 100 capsule 3  . losartan (COZAAR) 100 MG tablet TAKE 1 TABLET BY MOUTH EVERY DAY 90 tablet 3  . traMADol (ULTRAM) 50 MG tablet Take 1 tablet (50 mg total) by mouth every 6 (six) hours as needed. 120 tablet 2  . zolpidem (AMBIEN) 10 MG tablet TAKE 1 TABLET BY MOUTH AT BEDTIME AS NEEDED 30 tablet 5   No facility-administered medications prior to visit.     ROS Review of Systems  Constitutional: Negative for appetite change, fatigue and unexpected weight change.  HENT: Negative for congestion, nosebleeds, sneezing, sore throat and trouble swallowing.   Eyes: Negative for itching and visual disturbance.  Respiratory: Negative for cough.   Cardiovascular: Negative for chest pain, palpitations and leg swelling.  Gastrointestinal: Negative for abdominal distention, blood in stool, diarrhea and nausea.  Genitourinary: Negative for frequency and hematuria.  Musculoskeletal: Positive for arthralgias and back pain. Negative for gait problem, joint swelling and neck pain.  Skin: Negative for rash.  Neurological: Negative for dizziness, tremors, speech difficulty and weakness.  Psychiatric/Behavioral: Negative for agitation, dysphoric mood and sleep disturbance. The patient is not nervous/anxious.     Objective:  BP 126/82 (BP Location: Left Arm, Patient Position: Sitting, Cuff Size: Large)   Pulse (!) 59   Temp 98 F (36.7 C) (Oral)   Ht 5\' 10"  (1.778 m)   Wt 212 lb (96.2 kg)   SpO2 100%   BMI 30.42 kg/m   BP Readings from Last 3  Encounters:  08/10/17 126/82  02/09/17 138/86  08/04/16 (!) 146/96    Wt Readings from Last 3 Encounters:  08/10/17 212 lb (96.2 kg)  02/09/17 214 lb (97.1 kg)  08/04/16 222 lb 4 oz (100.8 kg)    Physical Exam  Constitutional: He is oriented to person, place, and time. He appears well-developed and well-nourished. No distress.  HENT:  Head: Normocephalic and atraumatic.  Right Ear: External ear normal.  Left Ear: External ear normal.  Nose: Nose normal.  Mouth/Throat: Oropharynx is clear and moist. No oropharyngeal exudate.  Eyes: Pupils are equal, round, and reactive to light. Conjunctivae and EOM are normal. Right eye exhibits no discharge. Left eye exhibits no discharge. No scleral icterus.  Neck: Normal range of motion. Neck supple. No JVD present. No tracheal deviation present. No thyromegaly present.  Cardiovascular: Normal rate, regular rhythm, normal heart sounds and intact distal pulses. Exam reveals no gallop and no friction rub.  No murmur heard. Pulmonary/Chest: Effort normal and breath sounds normal. No stridor. No respiratory distress. He has no wheezes. He has no rales. He exhibits no tenderness.  Abdominal: Soft. Bowel sounds are normal. He exhibits no distension and no mass. There is no tenderness. There is no rebound and no guarding.  Genitourinary: Rectum normal, prostate normal and penis normal. Rectal exam shows guaiac negative stool. No penile tenderness.  Musculoskeletal: Normal range of motion. He exhibits no edema or tenderness.  Lymphadenopathy:    He has no cervical adenopathy.  Neurological: He is alert and oriented to  person, place, and time. He has normal reflexes. No cranial nerve deficit. He exhibits normal muscle tone. Coordination normal.  Skin: Skin is warm and dry. No rash noted. He is not diaphoretic. No erythema. No pallor.  Psychiatric: He has a normal mood and affect. His behavior is normal. Judgment and thought content normal.  warts Prostate  1+    Procedure Note :     Procedure : Cryosurgery   Indication:  Wart(s)     Risks including unsuccessful procedure , bleeding, infection, bruising, scar, a need for a repeat  procedure and others were explained to the patient in detail as well as the benefits. Informed consent was obtained verbally.    3 lesion(s)  on forehead and chest   was/were treated with liquid nitrogen on a Q-tip in a usual fasion . Band-Aid was applied and antibiotic ointment was given for a later use.   Tolerated well. Complications none.   Postprocedure instructions :     Keep the wounds clean. You can wash them with liquid soap and water. Pat dry with gauze or a Kleenex tissue  Before applying antibiotic ointment and a Band-Aid.   You need to report immediately  if  any signs of infection develop.    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 EXAM: CHEST  2 VIEW COMPARISON:  02/15/2009 FINDINGS: Anterior cervical spinal fusion hardware. Stable cardiac and mediastinal contours. Mild tortuosity of the thoracic aorta. No consolidative pulmonary opacities. No pleural effusion or pneumothorax. Mid thoracic spine degenerative changes. IMPRESSION: No acute cardiopulmonary process. Electronically Signed   By: Annia Belt M.D.   On: 07/07/2014 13:48    Assessment & Plan:   There are no diagnoses linked to this encounter. I  am having Vania Rea. Cerone maintain his Vitamin D3, Aspirin-Acetaminophen-Caffeine, traMADol, zolpidem, and losartan.  No orders of the defined types were placed in this encounter.    Follow-up: No follow-ups on file.  Sonda Primes, MD

## 2017-08-16 ENCOUNTER — Telehealth: Payer: Self-pay | Admitting: Internal Medicine

## 2017-08-16 NOTE — Telephone Encounter (Signed)
Pt asking if Dr. Posey ReaPlotnikov wants to put him back on Crestor.  Best number is [phone]

## 2017-08-16 NOTE — Telephone Encounter (Signed)
noted 

## 2017-08-16 NOTE — Telephone Encounter (Signed)
Pt given results per notes of Dr Posey ReaPlotnikov on 08/12/17 Unable to document in result note due [facility] Pt has appt in September and wrote what labs he needs in appt notes.  Notes recorded by Plotnikov, Georgina QuintAleksei V, MD on 08/12/2017 at 11:37 PM EDT Kathie RhodesBetty, Please inform the patient that his hemoglobin was very high. He needs a phlebotomy at least every 3 months. His kidney function was slightly decreased. Lipids are elevated. His PSA is slightly higher than before however within normal range. I will need to see him back in 6 months with PSA lipids BMET CBC. Thanks, AP

## 2017-08-16 NOTE — Telephone Encounter (Signed)
Please advise 

## 2017-10-12 ENCOUNTER — Other Ambulatory Visit: Payer: Self-pay | Admitting: *Deleted

## 2017-10-12 ENCOUNTER — Other Ambulatory Visit (INDEPENDENT_AMBULATORY_CARE_PROVIDER_SITE_OTHER): Payer: 59

## 2017-10-12 DIAGNOSIS — D751 Secondary polycythemia: Secondary | ICD-10-CM | POA: Diagnosis not present

## 2017-10-12 LAB — CBC WITH DIFFERENTIAL/PLATELET
BASOS PCT: 1.1 % (ref 0.0–3.0)
Basophils Absolute: 0.1 10*3/uL (ref 0.0–0.1)
Eosinophils Absolute: 0.4 10*3/uL (ref 0.0–0.7)
Eosinophils Relative: 5.3 % — ABNORMAL HIGH (ref 0.0–5.0)
HCT: 49.9 % (ref 39.0–52.0)
HEMOGLOBIN: 16.8 g/dL (ref 13.0–17.0)
LYMPHS ABS: 2.2 10*3/uL (ref 0.7–4.0)
Lymphocytes Relative: 27 % (ref 12.0–46.0)
MCHC: 33.7 g/dL (ref 30.0–36.0)
MCV: 94.2 fl (ref 78.0–100.0)
MONO ABS: 0.9 10*3/uL (ref 0.1–1.0)
Monocytes Relative: 10.7 % (ref 3.0–12.0)
NEUTROS ABS: 4.6 10*3/uL (ref 1.4–7.7)
Neutrophils Relative %: 55.9 % (ref 43.0–77.0)
PLATELETS: 203 10*3/uL (ref 150.0–400.0)
RBC: 5.3 Mil/uL (ref 4.22–5.81)
RDW: 15.2 % (ref 11.5–15.5)
WBC: 8.2 10*3/uL (ref 4.0–10.5)

## 2017-11-13 ENCOUNTER — Ambulatory Visit: Payer: Self-pay | Admitting: Internal Medicine

## 2018-01-02 ENCOUNTER — Telehealth: Payer: Self-pay | Admitting: Internal Medicine

## 2018-01-02 NOTE — Telephone Encounter (Signed)
Pt called back this am to advise Dr why he would like to go back on this med.  Pt has started a new job, it is very stressful, and he is wanting to go back on this, (so he can keep the job).  Pt had worked same job for 20 yrs, and they closed down. Since this is a new job, pt is unable to take off and come in for an appt. Pt states when he was on this med a few years ago, and it really at that time. Pt hopes Dr Posey ReaPlotnikov will refill for him.

## 2018-01-02 NOTE — Telephone Encounter (Signed)
Copied from CRM [phone]. Topic: Quick Communication - Rx Refill/Question >> Jan 02, 2018  9:15 AM Gean BirchwoodWilliams-Neal, Sade R wrote: Medication: buPROPion (WELLBUTRIN XL) 150 MG 24 hr tablet   Has the patient contacted their pharmacy? No , wants to start meds back up  Preferred Pharmacy (with phone number or street name): [id number], [place] - [address] [phone] (Phone) [phone] (Fax)    Agent: Please be advised that RX refills may take up to 3 business days. We ask that you follow-up with your pharmacy.

## 2018-01-02 NOTE — Telephone Encounter (Signed)
Okay Wellbutrin XL 150 mg #30 with 3 refills.  Follow-up with me in 3 months. Thank you

## 2018-01-03 MED ORDER — BUPROPION HCL ER (XL) 150 MG PO TB24: 150.0000 mg | ORAL_TABLET | Freq: Every day | ORAL | 3 refills | Status: DC

## 2018-01-03 NOTE — Telephone Encounter (Signed)
Called pt no answer LMOM MD ok to send rx for wellbutrin. [facility]

## 2018-01-16 ENCOUNTER — Other Ambulatory Visit (INDEPENDENT_AMBULATORY_CARE_PROVIDER_SITE_OTHER): Payer: 59

## 2018-01-16 ENCOUNTER — Ambulatory Visit (INDEPENDENT_AMBULATORY_CARE_PROVIDER_SITE_OTHER): Payer: 59 | Admitting: Internal Medicine

## 2018-01-16 ENCOUNTER — Telehealth: Payer: Self-pay | Admitting: Internal Medicine

## 2018-01-16 ENCOUNTER — Encounter: Payer: Self-pay | Admitting: Internal Medicine

## 2018-01-16 VITALS — BP 124/82 | HR 95 | Temp 98.0°F | Ht 70.0 in | Wt 192.0 lb

## 2018-01-16 DIAGNOSIS — R109 Unspecified abdominal pain: Secondary | ICD-10-CM | POA: Insufficient documentation

## 2018-01-16 DIAGNOSIS — R634 Abnormal weight loss: Secondary | ICD-10-CM | POA: Diagnosis not present

## 2018-01-16 DIAGNOSIS — D751 Secondary polycythemia: Secondary | ICD-10-CM

## 2018-01-16 DIAGNOSIS — I10 Essential (primary) hypertension: Secondary | ICD-10-CM | POA: Diagnosis not present

## 2018-01-16 DIAGNOSIS — R1013 Epigastric pain: Secondary | ICD-10-CM | POA: Diagnosis not present

## 2018-01-16 DIAGNOSIS — F4321 Adjustment disorder with depressed mood: Secondary | ICD-10-CM

## 2018-01-16 DIAGNOSIS — G4700 Insomnia, unspecified: Secondary | ICD-10-CM

## 2018-01-16 LAB — BASIC METABOLIC PANEL
BUN: 18 mg/dL (ref 6–23)
CO2: 28 meq/L (ref 19–32)
Calcium: 10.4 mg/dL (ref 8.4–10.5)
Chloride: 100 mEq/L (ref 96–112)
Creatinine, Ser: 1.28 mg/dL (ref 0.40–1.50)
GFR: 60.85 mL/min (ref >60.00)
GLUCOSE: 90 mg/dL (ref 70–99)
Potassium: 4.5 mEq/L (ref 3.5–5.1)
Sodium: 135 mEq/L (ref 135–145)

## 2018-01-16 LAB — CBC WITH DIFFERENTIAL/PLATELET
BASOS PCT: 0.4 % (ref 0.0–3.0)
Basophils Absolute: 0 10*3/uL (ref 0.0–0.1)
Eosinophils Absolute: 0.1 10*3/uL (ref 0.0–0.7)
Eosinophils Relative: 0.5 % (ref 0.0–5.0)
HEMATOCRIT: 54.4 % — AB (ref 39.0–52.0)
LYMPHS PCT: 12.9 % (ref 12.0–46.0)
Lymphs Abs: 1.3 10*3/uL (ref 0.7–4.0)
MCHC: 34 g/dL (ref 30.0–36.0)
MCV: 92.7 fl (ref 78.0–100.0)
MONOS PCT: 9.4 % (ref 3.0–12.0)
Monocytes Absolute: 1 10*3/uL (ref 0.1–1.0)
NEUTROS ABS: 7.8 10*3/uL — AB (ref 1.4–7.7)
Neutrophils Relative %: 76.8 % (ref 43.0–77.0)
PLATELETS: 241 10*3/uL (ref 150.0–400.0)
RBC: 5.87 Mil/uL — ABNORMAL HIGH (ref 4.22–5.81)
RDW: 13.7 % (ref 11.5–15.5)
WBC: 10.2 10*3/uL (ref 4.0–10.5)

## 2018-01-16 LAB — HEPATIC FUNCTION PANEL
ALBUMIN: 4.1 g/dL (ref 3.5–5.2)
ALT: 26 U/L (ref 0–53)
AST: 16 U/L (ref 0–37)
Alkaline Phosphatase: 71 U/L (ref 39–117)
Bilirubin, Direct: 0.1 mg/dL (ref 0.0–0.3)
TOTAL PROTEIN: 7.6 g/dL (ref 6.0–8.3)
Total Bilirubin: 0.6 mg/dL (ref 0.2–1.2)

## 2018-01-16 LAB — H. PYLORI ANTIBODY, IGG: H Pylori IgG: NEGATIVE

## 2018-01-16 LAB — TSH: TSH: 1.46 u[IU]/mL (ref 0.35–4.50)

## 2018-01-16 MED ORDER — ESCITALOPRAM OXALATE 10 MG PO TABS: 10.0000 mg | ORAL_TABLET | Freq: Every day | ORAL | 5 refills | Status: DC

## 2018-01-16 MED ORDER — PANTOPRAZOLE SODIUM 40 MG PO TBEC: 40.0000 mg | DELAYED_RELEASE_TABLET | Freq: Every day | ORAL | 5 refills | Status: DC

## 2018-01-16 MED ORDER — ZOLPIDEM TARTRATE 10 MG PO TABS: ORAL_TABLET | ORAL | 5 refills | Status: DC

## 2018-01-16 NOTE — Assessment & Plan Note (Signed)
Stress related vs other. Wt loss  Abd US Labs Protonix qd Treat stress

## 2018-01-16 NOTE — Assessment & Plan Note (Signed)
Stress related vs other. Wt loss  Abd US Labs Protonix qd Treat stress 

## 2018-01-16 NOTE — Telephone Encounter (Signed)
Please advise of critical lab value in Dr. Adah PerlPlotnikovs absence.

## 2018-01-16 NOTE — Telephone Encounter (Signed)
rec'd [facility]Karen[facility].  Reported Hgb of 18.5.  [facility] and make aware of critical lab value, and also send a telephone note.  Verb. Understanding.

## 2018-01-16 NOTE — Assessment & Plan Note (Signed)
Wt Readings from Last 3 Encounters:  01/16/18 192 lb (87.1 kg)  08/10/17 212 lb (96.2 kg)  02/09/17 214 lb (97.1 kg)   BP Readings from Last 3 Encounters:  01/16/18 124/82  08/10/17 126/82  02/09/17 138/86

## 2018-01-16 NOTE — Assessment & Plan Note (Signed)
work related Lexapro

## 2018-01-16 NOTE — Progress Notes (Signed)
Subjective:  Patient ID: Jonathon Griffin, male    DOB: 01/31/1956  Age: 62 y.o. MRN: [id number]  CC: No chief complaint on file.   HPI Jonathon Griffin presents for stress. Worse in the past 3 weeks. He stopped Wellbutrin due to shaking Lost his primary job. 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 C/o wt loss and epig pain, can't eat. No alcohol   Outpatient Medications Prior to Visit  Medication Sig Dispense Refill  . Aspirin-Acetaminophen-Caffeine (GOODYS EXTRA STRENGTH) 500-325-65 MG PACK 1 bid pc 56 each 0  . buPROPion (WELLBUTRIN XL) 150 MG 24 hr tablet Take 1 tablet (150 mg total) by mouth daily. 30 tablet 3  . Cholecalciferol (VITAMIN D3) 2000 units capsule Take 1 capsule (2,000 Units total) by mouth daily. 100 capsule 3  . losartan (COZAAR) 100 MG tablet Take 1 tablet (100 mg total) by mouth daily. 90 tablet 3  . traMADol (ULTRAM) 50 MG tablet Take 1 tablet (50 mg total) by mouth every 6 (six) hours as needed. 120 tablet 3  . zolpidem (AMBIEN) 10 MG tablet TAKE 1 TABLET BY MOUTH AT BEDTIME AS NEEDED 30 tablet 5   No facility-administered medications prior to visit.     ROS: Review of Systems  Constitutional: Positive for fatigue and unexpected weight change. Negative for appetite change.  HENT: Negative for congestion, nosebleeds, sneezing, sore throat and trouble swallowing.   Eyes: Negative for itching and visual disturbance.  Respiratory: Negative for cough.   Cardiovascular: Negative for chest pain, palpitations and leg swelling.  Gastrointestinal: Positive for abdominal pain. Negative for abdominal distention, blood in stool, diarrhea and nausea.  Genitourinary: Negative for frequency and hematuria.  Musculoskeletal: Positive for arthralgias. Negative for back pain, gait problem, joint swelling and neck pain.  Skin: Negative for rash.  Neurological: Negative for dizziness, tremors, speech  difficulty and weakness.  Psychiatric/Behavioral: Positive for dysphoric mood and sleep disturbance. Negative for agitation. The patient is nervous/anxious.     Objective:  BP 124/82 (BP Location: Left Arm, Patient Position: Sitting, Cuff Size: Normal)   Pulse 95   Temp 98 F (36.7 C) (Oral)   Ht 5\' 10"  (1.778 m)   Wt 192 lb (87.1 kg)   SpO2 98%   BMI 27.55 kg/m   BP Readings from Last 3 Encounters:  01/16/18 124/82  08/10/17 126/82  02/09/17 138/86    Wt Readings from Last 3 Encounters:  01/16/18 192 lb (87.1 kg)  08/10/17 212 lb (96.2 kg)  02/09/17 214 lb (97.1 kg)    Physical Exam  Constitutional: He is oriented to person, place, and time. He appears well-developed. No distress.  NAD  HENT:  Mouth/Throat: Oropharynx is clear and moist.  Eyes: Pupils are equal, round, and reactive to light. Conjunctivae are normal.  Neck: Normal range of motion. No JVD present. No thyromegaly present.  Cardiovascular: Normal rate, regular rhythm, normal heart sounds and intact distal pulses. Exam reveals no gallop and no friction rub.  No murmur heard. Pulmonary/Chest: Effort normal and breath sounds normal. No respiratory distress. He has no wheezes. He has no rales. He exhibits no tenderness.  Abdominal: Soft. Bowel sounds are normal. He exhibits no distension and no mass. There is no tenderness. There is no rebound and no guarding.  Musculoskeletal: Normal range of motion. He exhibits no edema or tenderness.  Lymphadenopathy:    He has no cervical adenopathy.  Neurological: He is alert and oriented to person, place, and time. He has normal reflexes. No cranial nerve deficit. He exhibits normal muscle tone. He displays a negative Romberg sign. Coordination and gait normal.  Skin: Skin is warm and dry. No rash noted.  Psychiatric: His behavior is normal. Judgment and thought content normal.    Lab Results  Component Value Date   WBC 8.2 10/12/2017   HGB 16.8 10/12/2017   HCT 49.9  10/12/2017   PLT 203.0 10/12/2017   GLUCOSE 85 08/10/2017   CHOL 229 (H) 08/10/2017   TRIG 180.0 (H) 08/10/2017   HDL 40.60 08/10/2017   LDLDIRECT 228.0 03/12/2009   LDLCALC 152 (H) 08/10/2017   ALT 51 08/10/2017   AST 34 08/10/2017   NA 140 08/10/2017   K 4.6 08/10/2017   CL 103 08/10/2017   CREATININE 1.38 08/10/2017   BUN 21 08/10/2017   CO2 27 08/10/2017   TSH 1.28 08/10/2017   PSA 2.87 08/10/2017    Dg Chest 2 View  Result Date: 07/07/2014 CLINICAL DATA:  Patient with cough for 2 months. History of welding. EXAM: CHEST  2 VIEW COMPARISON:  02/15/2009 FINDINGS: Anterior cervical spinal fusion hardware. Stable cardiac and mediastinal contours. Mild tortuosity of the thoracic aorta. No consolidative pulmonary opacities. No pleural effusion or pneumothorax. Mid thoracic spine degenerative changes. IMPRESSION: No acute cardiopulmonary process. Electronically Signed   By: Annia Belt M.D.   On: 07/07/2014 13:48    Assessment & Plan:   There are no diagnoses linked to this encounter.   No orders of the defined types were placed in this encounter.    Follow-up: No follow-ups on file.  Sonda Primes, MD

## 2018-01-16 NOTE — Assessment & Plan Note (Signed)
Zolpidem prn  Potential benefits of a long term benzodiazepines  use as well as potential risks  and complications were explained to the patient and were aknowledged. 

## 2018-01-16 NOTE — Telephone Encounter (Signed)
This is known chronic issue; should be ok to be followed per Dr Posey ReaPlotnikov when able

## 2018-01-17 NOTE — Addendum Note (Signed)
Addended by: Scarlett PrestoFRIEDENBACH, Swati Granberry on: 01/17/2018 09:49 AM   Modules accepted: Orders

## 2018-02-07 ENCOUNTER — Other Ambulatory Visit: Payer: Self-pay | Admitting: Internal Medicine

## 2018-02-07 NOTE — Telephone Encounter (Signed)
Please advise about RX 

## 2018-02-08 ENCOUNTER — Ambulatory Visit (INDEPENDENT_AMBULATORY_CARE_PROVIDER_SITE_OTHER): Payer: 59 | Admitting: Internal Medicine

## 2018-02-08 ENCOUNTER — Ambulatory Visit: Payer: Self-pay | Admitting: Internal Medicine

## 2018-02-08 ENCOUNTER — Encounter: Payer: Self-pay | Admitting: Internal Medicine

## 2018-02-08 DIAGNOSIS — F413 Other mixed anxiety disorders: Secondary | ICD-10-CM

## 2018-02-08 DIAGNOSIS — F4321 Adjustment disorder with depressed mood: Secondary | ICD-10-CM | POA: Diagnosis not present

## 2018-02-08 DIAGNOSIS — R634 Abnormal weight loss: Secondary | ICD-10-CM | POA: Diagnosis not present

## 2018-02-08 DIAGNOSIS — F419 Anxiety disorder, unspecified: Secondary | ICD-10-CM | POA: Insufficient documentation

## 2018-02-08 MED ORDER — OLANZAPINE-FLUOXETINE HCL 3-25 MG PO CAPS: 1.0000 | ORAL_CAPSULE | Freq: Every evening | ORAL | 5 refills | Status: DC

## 2018-02-08 NOTE — Progress Notes (Signed)
Subjective:  Patient ID: Jonathon Griffin, male    DOB: 09-Feb-1956  Age: 62 y.o. MRN: [id number]  CC: No chief complaint on file.   HPI Jonathon SparrowDavid R Boruff presents for anxiety, insomnia. C/o not eating. Wife is here: c/o no appetite; wt loss. He stays at home, not going anywhere.C/o claustrophobia. No emotions.Job loss in June triggered negative events. Not taking Lexapro.Marland Kitchen.Marland Kitchen.Pacing a lot   Outpatient Medications Prior to Visit  Medication Sig Dispense Refill  . Aspirin-Acetaminophen-Caffeine (GOODYS EXTRA STRENGTH) 500-325-65 MG PACK 1 bid pc 56 each 0  . Cholecalciferol (VITAMIN D3) 2000 units capsule Take 1 capsule (2,000 Units total) by mouth daily. 100 capsule 3  . losartan (COZAAR) 100 MG tablet Take 1 tablet (100 mg total) by mouth daily. 90 tablet 3  . pantoprazole (PROTONIX) 40 MG tablet Take 1 tablet (40 mg total) by mouth daily. 30 tablet 5  . traMADol (ULTRAM) 50 MG tablet Take 1 tablet (50 mg total) by mouth every 6 (six) hours as needed. 120 tablet 3  . zolpidem (AMBIEN) 10 MG tablet TAKE 1 TABLET BY MOUTH AT BEDTIME AS NEEDED 30 tablet 5  . escitalopram (LEXAPRO) 10 MG tablet Take 1 tablet (10 mg total) by mouth daily. (Patient not taking: Reported on 02/08/2018) 30 tablet 5   No facility-administered medications prior to visit.     ROS: Review of Systems  Constitutional: Negative for appetite change, fatigue and unexpected weight change.  HENT: Negative for congestion, nosebleeds, sneezing, sore throat and trouble swallowing.   Eyes: Negative for itching and visual disturbance.  Respiratory: Negative for cough.   Cardiovascular: Negative for chest pain, palpitations and leg swelling.  Gastrointestinal: Negative for abdominal distention, blood in stool, diarrhea and nausea.  Genitourinary: Negative for frequency and hematuria.  Musculoskeletal: Negative for back pain, gait problem, joint swelling and neck pain.  Skin: Negative for rash.  Neurological: Negative for  dizziness, tremors, speech difficulty and weakness.  Psychiatric/Behavioral: Positive for decreased concentration, dysphoric mood and sleep disturbance. Negative for agitation and suicidal ideas. The patient is nervous/anxious.   not homicidal  Objective:  BP 122/82 (BP Location: Left Arm, Patient Position: Sitting, Cuff Size: Normal)   Pulse 77   Temp 97.7 F (36.5 C) (Oral)   Ht 5\' 10"  (1.778 m)   Wt 182 lb (82.6 kg)   SpO2 97%   BMI 26.11 kg/m   BP Readings from Last 3 Encounters:  02/08/18 122/82  01/16/18 124/82  08/10/17 126/82    Wt Readings from Last 3 Encounters:  02/08/18 182 lb (82.6 kg)  01/16/18 192 lb (87.1 kg)  08/10/17 212 lb (96.2 kg)    Physical Exam  Constitutional: He is oriented to person, place, and time. He appears well-developed. No distress.  NAD  HENT:  Mouth/Throat: Oropharynx is clear and moist.  Eyes: Pupils are equal, round, and reactive to light. Conjunctivae are normal.  Neck: Normal range of motion. No JVD present. No thyromegaly present.  Cardiovascular: Normal rate, regular rhythm, normal heart sounds and intact distal pulses. Exam reveals no gallop and no friction rub.  No murmur heard. Pulmonary/Chest: Effort normal and breath sounds normal. No respiratory distress. He has no wheezes. He has no rales. He exhibits no tenderness.  Abdominal: Soft. Bowel sounds are normal. He exhibits no distension and no mass. There is no tenderness. There is no rebound and no guarding.  Musculoskeletal: Normal range of motion. He exhibits no edema or tenderness.  Lymphadenopathy:    He has  no cervical adenopathy.  Neurological: He is alert and oriented to person, place, and time. He has normal reflexes. No cranial nerve deficit. He exhibits normal muscle tone. He displays a negative Romberg sign. Coordination and gait normal.  Skin: Skin is warm and dry. No rash noted.  Psychiatric: He has a normal mood and affect. His behavior is normal. Judgment and  thought content normal.   Sad, reserved   Lab Results  Component Value Date   WBC 10.2 01/16/2018   HGB 18.5 Repeated and verified X2. (HH) 01/16/2018   HCT 54.4 (H) 01/16/2018   PLT 241.0 01/16/2018   GLUCOSE 90 01/16/2018   CHOL 229 (H) 08/10/2017   TRIG 180.0 (H) 08/10/2017   HDL 40.60 08/10/2017   LDLDIRECT 228.0 03/12/2009   LDLCALC 152 (H) 08/10/2017   ALT 26 01/16/2018   AST 16 01/16/2018   NA 135 01/16/2018   K 4.5 01/16/2018   CL 100 01/16/2018   CREATININE 1.28 01/16/2018   BUN 18 01/16/2018   CO2 28 01/16/2018   TSH 1.46 01/16/2018   PSA 2.87 08/10/2017    Dg Chest 2 View  Result Date: 07/07/2014 CLINICAL DATA:  Patient with cough for 2 months. History of welding. EXAM: CHEST  2 VIEW COMPARISON:  02/15/2009 FINDINGS: Anterior cervical spinal fusion hardware. Stable cardiac and mediastinal contours. Mild tortuosity of the thoracic aorta. No consolidative pulmonary opacities. No pleural effusion or pneumothorax. Mid thoracic spine degenerative changes. IMPRESSION: No acute cardiopulmonary process. Electronically Signed   By: Annia Belt M.D.   On: 07/07/2014 13:48    Assessment & Plan:   There are no diagnoses linked to this encounter.   No orders of the defined types were placed in this encounter.    Follow-up: No follow-ups on file.  Sonda Primes, MD

## 2018-02-08 NOTE — Patient Instructions (Signed)
See a psychologist please

## 2018-02-08 NOTE — Assessment & Plan Note (Signed)
Worse Start Symbyax  Potential benefits of a long term Symbyax use as well as potential risks  and complications were explained to the patient and were aknowledged. Appt w/psychology

## 2018-02-08 NOTE — Assessment & Plan Note (Addendum)
Discussed Start Symbyax  Potential benefits of a long term Symbyax use as well as potential risks  and complications were explained to the patient and were aknowledged.

## 2018-02-08 NOTE — Assessment & Plan Note (Signed)
Start Symbyax  Potential benefits of a long term Symbyax use as well as potential risks  and complications were explained to the patient and were aknowledged. Appt w/psychology

## 2018-02-11 ENCOUNTER — Encounter (HOSPITAL_COMMUNITY): Payer: Self-pay

## 2018-02-11 ENCOUNTER — Emergency Department (HOSPITAL_COMMUNITY)
Admission: EM | Admit: 2018-02-11 | Discharge: 2018-02-12 | Disposition: A | Discharge status: Home / Self Care | Payer: 59 | Attending: Emergency Medicine | Admitting: Emergency Medicine

## 2018-02-11 ENCOUNTER — Other Ambulatory Visit: Payer: Self-pay

## 2018-02-11 DIAGNOSIS — Z79899 Other long term (current) drug therapy: Secondary | ICD-10-CM | POA: Insufficient documentation

## 2018-02-11 DIAGNOSIS — R109 Unspecified abdominal pain: Secondary | ICD-10-CM | POA: Insufficient documentation

## 2018-02-11 DIAGNOSIS — R630 Anorexia: Secondary | ICD-10-CM | POA: Diagnosis not present

## 2018-02-11 DIAGNOSIS — I10 Essential (primary) hypertension: Secondary | ICD-10-CM | POA: Insufficient documentation

## 2018-02-11 DIAGNOSIS — R634 Abnormal weight loss: Secondary | ICD-10-CM | POA: Diagnosis not present

## 2018-02-11 LAB — URINALYSIS, ROUTINE W REFLEX MICROSCOPIC
BILIRUBIN URINE: NEGATIVE
Glucose, UA: NEGATIVE mg/dL
HGB URINE DIPSTICK: NEGATIVE
Ketones, ur: 20 mg/dL — AB
Leukocytes, UA: NEGATIVE
Nitrite: NEGATIVE
PROTEIN: NEGATIVE mg/dL
Specific Gravity, Urine: 1.018 (ref 1.005–1.030)
pH: 5 (ref 5.0–8.0)

## 2018-02-11 LAB — COMPREHENSIVE METABOLIC PANEL
ALBUMIN: 3.6 g/dL (ref 3.5–5.0)
ALK PHOS: 71 U/L (ref 38–126)
ALT: 34 U/L (ref 0–44)
AST: 20 U/L (ref 15–41)
Anion gap: 15 (ref 5–15)
BUN: 16 mg/dL (ref 6–20)
CALCIUM: 10 mg/dL (ref 8.9–10.3)
CO2: 19 mmol/L — AB (ref 22–32)
CREATININE: 1.13 mg/dL (ref 0.61–1.24)
Chloride: 101 mmol/L (ref 98–111)
GFR calc non Af Amer: >60 mL/min (ref >60)
GLUCOSE: 93 mg/dL (ref 70–99)
Potassium: 4.2 mmol/L (ref 3.5–5.1)
Sodium: 135 mmol/L (ref 135–145)
Total Bilirubin: 1.5 mg/dL — ABNORMAL HIGH (ref 0.3–1.2)
Total Protein: 7.5 g/dL (ref 6.5–8.1)

## 2018-02-11 LAB — CBC
HCT: 50.2 % (ref 39.0–52.0)
Hemoglobin: 16.5 g/dL (ref 13.0–17.0)
MCH: 29.8 pg (ref 26.0–34.0)
MCHC: 32.9 g/dL (ref 30.0–36.0)
MCV: 90.6 fL (ref 78.0–100.0)
PLATELETS: 263 10*3/uL (ref 150–400)
RBC: 5.54 MIL/uL (ref 4.22–5.81)
RDW: 12.3 % (ref 11.5–15.5)
WBC: 10.3 10*3/uL (ref 4.0–10.5)

## 2018-02-11 LAB — LIPASE, BLOOD: LIPASE: 61 U/L — AB (ref 11–51)

## 2018-02-11 NOTE — ED Triage Notes (Signed)
Pt here for abdominal discomfort and lack of appetite. Said he lost 30 lbs in the last month.  Said he feels hungry but just does not want to eat.  Also states he's dehydrated.

## 2018-02-11 NOTE — ED Notes (Signed)
ED Provider at bedside. 

## 2018-02-12 NOTE — Discharge Instructions (Addendum)
Please follow up with Dr. Posey ReaPlotnikov and a counselor Return if you are worsening

## 2018-02-12 NOTE — ED Provider Notes (Signed)
[facility]   CSN: [id number] Arrival date & time: 02/11/18  1840     History   Chief Complaint Chief Complaint  Patient presents with  . Abdominal Pain    HPI Jonathon Griffin is a 62 y.o. male who presents with anorexia, unintentional weight loss, abdominal pain. PMH significant for HTN. He states that for the past month he has had a constant lower abdominal ache as well as "kidney pain" on the left side of his back that is worse with movement. His wife is at bedside and is very concerned because he hasn't been eating and feels he may be dehydrated. She thinks the patient's symptoms stem from when he lost his job in June which he has been doing for the past 26 years. The patient states he's able to drink water but has difficulty swallowing solid food and feels that he has to vomit after eating. He denies fever, painful swallowing, nausea, vomiting. He has diarrhea and constipation at times. He is urinating. His PCP thought he was depressed and started him on a new medicine for mood/depression. The patient states he doesn't want to intentionally hurt himself. He denies prior abdominal surgeries.   HPI  Past Medical History:  Diagnosis Date  . Arthritis   . Hypertension     Patient Active Problem List   Diagnosis Date Noted  . Anxiety disorder 02/08/2018  . Abdominal pain 01/16/2018  . Weight loss, non-intentional 01/16/2018  . Well adult exam 08/10/2017  . Warts 08/10/2017  . Acute bronchitis 07/07/2014  . Cough 07/07/2014  . Osteoarthritis 07/07/2014  . Polycythemia, secondary 03/06/2014  . Essential hypertension 03/06/2014  . Insomnia 08/16/2012  . Elevated blood pressure 06/14/2012  . Patient exposure to body fluids 04/11/2012  . Situational depression 04/11/2012  . Hyperlipidemia 03/19/2009  . Alcohol abuse, in remission 03/19/2009  . Abnormal CBC measurement 03/19/2009  . Elevated AST (SGOT) 03/19/2009  . DYSPNEA  02/15/2009  . PULMONARY FUNCTION TESTS, ABNORMAL 02/15/2009    History reviewed. No pertinent surgical history.      Home Medications    Prior to Admission medications   Medication Sig Start Date End Date Taking? Authorizing Provider  Aspirin-Acetaminophen-Caffeine (GOODYS EXTRA STRENGTH) [phone] MG PACK 1 bid pc 04/28/16   Plotnikov, Georgina QuintAleksei V, MD  Cholecalciferol (VITAMIN D3) 2000 units capsule Take 1 capsule (2,000 Units total) by mouth daily. 03/17/16   Plotnikov, Georgina QuintAleksei V, MD  losartan (COZAAR) 100 MG tablet Take 1 tablet (100 mg total) by mouth daily. 08/10/17   Plotnikov, Georgina QuintAleksei V, MD  OLANZapine-FLUoxetine (SYMBYAX) 3-25 MG capsule Take 1 capsule by mouth every evening. Take 1 before or with dinner 02/08/18   Plotnikov, Georgina QuintAleksei V, MD  pantoprazole (PROTONIX) 40 MG tablet Take 1 tablet (40 mg total) by mouth daily. 01/16/18   Plotnikov, Georgina QuintAleksei V, MD  traMADol (ULTRAM) 50 MG tablet Take 1 tablet (50 mg total) by mouth every 6 (six) hours as needed. 08/10/17   Plotnikov, Georgina QuintAleksei V, MD  zolpidem (AMBIEN) 10 MG tablet TAKE 1 TABLET BY MOUTH AT BEDTIME AS NEEDED 01/16/18   Plotnikov, Georgina QuintAleksei V, MD    Family History Family History  Problem Relation Age of Onset  . Arthritis Mother   . Arthritis Sister     Social History Social History   Tobacco Use  . Smoking status: Never Smoker  . Smokeless tobacco: Never Used  Substance Use Topics  . Alcohol use: No  . Drug use: No  Allergies   Oxycodone-acetaminophen and Wellbutrin [bupropion]   Review of Systems Review of Systems  Constitutional: Positive for appetite change and unexpected weight change. Negative for fever.  Respiratory: Negative for shortness of breath.   Cardiovascular: Negative for chest pain.  Gastrointestinal: Positive for abdominal pain, constipation and diarrhea. Negative for nausea and vomiting.  Genitourinary: Negative for decreased urine volume and dysuria.  Neurological: Negative for syncope.    All other systems reviewed and are negative.    Physical Exam Updated Vital Signs BP 136/85 (BP Location: Left Arm)   Pulse 86   Temp 98.7 F (37.1 C) (Oral)   Resp 16   Ht 5\' 10"  (1.778 m)   Wt 82.6 kg   SpO2 97%   BMI 26.11 kg/m   Physical Exam  Constitutional: He is oriented to person, place, and time. He appears well-developed and well-nourished. No distress.  Calm, cooperative. Withdrawn  HENT:  Head: Normocephalic and atraumatic.  Eyes: Pupils are equal, round, and reactive to light. Conjunctivae are normal. Right eye exhibits no discharge. Left eye exhibits no discharge. No scleral icterus.  Neck: Normal range of motion.  Cardiovascular: Normal rate and regular rhythm.  Pulmonary/Chest: Effort normal and breath sounds normal. No respiratory distress.  Abdominal: Soft. Bowel sounds are normal. He exhibits no distension and no mass. There is no tenderness. There is no rebound and no guarding. No hernia.  Neurological: He is alert and oriented to person, place, and time.  Skin: Skin is warm and dry.  Psychiatric: He has a normal mood and affect. His behavior is normal.  Nursing note and vitals reviewed.    ED Treatments / Results  Labs (all labs ordered are listed, but only abnormal results are displayed) Labs Reviewed  LIPASE, BLOOD - Abnormal; Notable for the following components:      Result Value   Lipase 61 (*)    All other components within normal limits  COMPREHENSIVE METABOLIC PANEL - Abnormal; Notable for the following components:   CO2 19 (*)    Total Bilirubin 1.5 (*)    All other components within normal limits  URINALYSIS, ROUTINE W REFLEX MICROSCOPIC - Abnormal; Notable for the following components:   Ketones, ur 20 (*)    All other components within normal limits  CBC    EKG None  Radiology No results found.  Procedures Procedures (including critical care time)  Medications Ordered in ED Medications - No data to display   Initial  Impression / Assessment and Plan / ED Course  I have reviewed the triage vital signs and the nursing notes.  Pertinent labs & imaging results that were available during my care of the patient were reviewed by me and considered in my medical decision making (see chart for details).  62 year old male presents with non-specific abdominal pain, anorexia, weight loss, depression. Symptoms seem to stem after losing his job in June. Wife is concerned about him not eating and becoming dehydrated. His vitals are normal. Labs are overall normal. He has 20 ketones in the urine. Abdomen is soft, benign. Offered CT scan and fluids to rule out emergent pathology today. Had long discussion with patient and his wife. They decline both and would prefer to f/u with a counselor and continue Symbyax. They were given return precautions.  Final Clinical Impressions(s) / ED Diagnoses   Final diagnoses:  Abdominal pain, unspecified abdominal location  Anorexia  Unintentional weight loss    ED Discharge Orders    None  Bethel Born, PA-C 02/12/18 Amada Kingfisher, MD 02/12/18 [phone]

## 2018-02-12 NOTE — ED Notes (Signed)
Depart[facility] at 0041 [facility]

## 2018-02-13 ENCOUNTER — Ambulatory Visit: Payer: 59 | Admitting: Psychology

## 2018-02-14 ENCOUNTER — Ambulatory Visit: Payer: Self-pay | Admitting: Internal Medicine

## 2018-02-14 NOTE — Telephone Encounter (Signed)
Incoming call from Patients wife, who is very upset.    Wife states that she had to take him over to his parents house late last night around 2;30 AM  For safety reasons while she goes to work. Wife states that he has a flat affect. Wont eat.   Related to wife that due to the fact she is not on DPR I really wont able to discuuss care nor advise  In the care of husband.  Wife became more upset. Related to wife that I will route telephone encounter to Dr. Posey Rea 's office . Wife awaits return call from  Dr. Posey Rea  States she just wants recommendation of where she may take her husband for evaluation.

## 2018-02-14 NOTE — Telephone Encounter (Signed)
  Reason for Disposition . [1] Depression AND [2] unable to do any of normal activities (e.g., self care, school, work; in comparison to baseline).  Additional Information . Commented on: Answer Assessment - Initial Assessment Questions    Unable to assess.  Protocols used: DEPRESSION-A-AH

## 2018-02-15 ENCOUNTER — Ambulatory Visit: Payer: Self-pay | Admitting: Internal Medicine

## 2018-02-15 NOTE — Telephone Encounter (Signed)
Please advise.  LM notifying that you are out of the office till Monday.

## 2018-02-17 NOTE — Telephone Encounter (Signed)
Noted. Cont Symbiax. Is he better in any way? Move appt w/me up. Was abd US done? Would he like to see a psychiatrist? Thx

## 2018-02-18 NOTE — Telephone Encounter (Signed)
LMTCB

## 2018-02-21 ENCOUNTER — Ambulatory Visit: Payer: Self-pay | Admitting: Internal Medicine

## 2018-02-22 ENCOUNTER — Ambulatory Visit: Payer: Self-pay | Admitting: Internal Medicine

## 2018-02-22 NOTE — Telephone Encounter (Signed)
Pt called requesting a referral to a psychiatrist. Pt had 1 visit with a counselor named Erin at "[facility]." Pt stated his wife is wanting him to get a referral to a psychiatrist to prescribe medications and therapy.  Pt was seen by PCP 02/08/18 for anxiety and insomnia, not eating, weight loss. Pt was prescribed Symbyax and referred to [facility]. Pt was seen at the ED at [facility] 02/11/18 for anorexia,unintentional weight loss and abdominal pain. Pt was examined and discharged. Pt c/o continued insomnia and for the past 2 days pt has not taken the Symbyax because he said it causes him insomnia. Pt stated he has taken Ambien instead and has gotten a few hours of sleep.  Pt talked with a flat, slow monotone voice. Pt sounds fatigued. Pt has lost 30 pounds over the last 3 months and is having feelings of sadness, guilt, hopelessness,anxiety,insomnia and difficulty with concentration.  Asked pt twice if having suicidal ideation and pt replied no both times. Pt denied homicidal ideation. Pt contracted for safety. Pt stated he lost his job in June, then got another job but had to quit job because he couldn't concentrate and was a physically hard job. Pt stated he is on his wife's insurance and she plans to retire next March. Pt stated he is trying to get better so he can get employed and stay employed and have insurance.  His support is his wife and his parents. He is currently at his house alone. He does not drive. Pt denies any drug or alcohol abuse or any other medical symptoms. Called PCP office and spoke with Sam to see if pt could be worked in Dr [employer] schedule today. Sam stated PCP had no openings until next week. Offered pt 2 different appointments but pt declined due to not having transportation.Pt stated that he would not be able to come see Dr Posey Rea today also due to lack of transportation. Wife is at work.  Pt given the number to [facility] behavioral health [phone]). Unsure if  pt will call. Called pt back and advised to call 911 to go to [facility] to be evaluated. Pt refused.  Pt contracted for safety and agreed to call back if he feels like harming himself or feels worse.   Sending triage note to PCP office for review.  Reason for Disposition . [1] Depression AND [2] worsening (e.g.,sleeping poorly, less able to do activities of daily living) . Symptoms interfere with work or school . Requesting to talk with a counselor (mental health worker, psychiatrist, etc.)    Wanting a referral to a psychiatrist . [1] Significant weight loss (i.e., > 10 pounds or 5 kg) AND [2] not dieting . [1] Depression AND [2] unable to do any of normal activities (e.g., self care, school, work; in comparison to baseline). . [1] Started on anti-depressant medications < 2 weeks ago AND [2] not feeling any better . Depression resources and referral numbers, questions about  Answer Assessment - Initial Assessment Questions 1. CONCERN: "What happened that made you call today?"     Wanting a referral to a psychiatrist 2. DEPRESSION SYMPTOM SCREENING: "How are you feeling overall?" (e.g., decreased energy, increased sleeping or difficulty sleeping, difficulty concentrating, feelings of sadness, guilt, hopelessness, or worthlessness)     Not eating lost over 3 months lost 30 pounds,feeling of sadness guiltt hopelessness, anxiety,not sleeping,difficulty with concentrating and feeling hopeless  3. RISK OF HARM - SUICIDAL IDEATION:  "Do you ever have thoughts  of hurting or killing yourself?"  (e.g., yes, no, no but preoccupation with thoughts about death)   - INTENT:  "Do you have thoughts of hurting or killing yourself right NOW?" (e.g., yes, no, N/A)   - PLAN: "Do you have a specific plan for how you would do this?" (e.g., gun, knife, overdose, no plan, N/A)     no 4. RISK OF HARM - HOMICIDAL IDEATION:  "Do you ever have thoughts of hurting or killing someone else?"  (e.g., yes, no, no but  preoccupation with thoughts about death)   - INTENT:  "Do you have thoughts of hurting or killing someone right NOW?" (e.g., yes, no, N/A)   - PLAN: "Do you have a specific plan for how you would do this?" (e.g., gun, knife, no plan, N/A)      no 5. FUNCTIONAL IMPAIRMENT: "How have things been going for you overall in your life? Have you had any more difficulties than usual doing your normal daily activities?"  (e.g., better, same, worse; self-care, school, work, Curator)     Started when lost a job in June got another one but had to quit job couldn't concentrate and hard to do physically 6. SUPPORT: "Who is with you now?" "Who do you live with?" "Do you have family or friends nearby who you can talk to?"      Wife is at work- does have have friends and family  7. THERAPIST: "Do you have a counselor or therapist? Name?"     Counselor "[facility]" Erin cant remember last name 8. STRESSORS: "Has there been any new stress or recent changes in your life?"     Job currently unemployed 9. DRUG ABUSE/ALCOHOL: "Do you drink alcohol or use any illegal drugs?"      no 10. OTHER: "Do you have any other health or medical symptoms right now?" (e.g., fever)       no 11. PREGNANCY: "Is there any chance you are pregnant?" "When was your last menstrual period?"       n/a  Protocols used: DEPRESSION-A-AH

## 2018-02-22 NOTE — Telephone Encounter (Signed)
Please advise 

## 2018-02-24 ENCOUNTER — Other Ambulatory Visit: Payer: Self-pay

## 2018-02-24 ENCOUNTER — Inpatient Hospital Stay (HOSPITAL_COMMUNITY)
Admission: RE | Admit: 2018-02-24 | Discharge: 2018-03-01 | DRG: 881 | Disposition: A | Discharge status: Home / Self Care | Payer: 59 | Attending: Psychiatry | Admitting: Psychiatry

## 2018-02-24 ENCOUNTER — Encounter (HOSPITAL_COMMUNITY): Payer: Self-pay | Admitting: *Deleted

## 2018-02-24 DIAGNOSIS — F411 Generalized anxiety disorder: Secondary | ICD-10-CM | POA: Diagnosis present

## 2018-02-24 DIAGNOSIS — I10 Essential (primary) hypertension: Secondary | ICD-10-CM | POA: Diagnosis present

## 2018-02-24 DIAGNOSIS — Z888 Allergy status to other drugs, medicaments and biological substances status: Secondary | ICD-10-CM

## 2018-02-24 DIAGNOSIS — R45851 Suicidal ideations: Secondary | ICD-10-CM | POA: Diagnosis present

## 2018-02-24 DIAGNOSIS — F419 Anxiety disorder, unspecified: Secondary | ICD-10-CM | POA: Diagnosis not present

## 2018-02-24 DIAGNOSIS — Z885 Allergy status to narcotic agent status: Secondary | ICD-10-CM

## 2018-02-24 DIAGNOSIS — F329 Major depressive disorder, single episode, unspecified: Secondary | ICD-10-CM | POA: Diagnosis present

## 2018-02-24 DIAGNOSIS — E785 Hyperlipidemia, unspecified: Secondary | ICD-10-CM | POA: Diagnosis present

## 2018-02-24 DIAGNOSIS — F322 Major depressive disorder, single episode, severe without psychotic features: Secondary | ICD-10-CM | POA: Diagnosis not present

## 2018-02-24 DIAGNOSIS — Z599 Problem related to housing and economic circumstances, unspecified: Secondary | ICD-10-CM

## 2018-02-24 DIAGNOSIS — G4700 Insomnia, unspecified: Secondary | ICD-10-CM | POA: Diagnosis present

## 2018-02-24 DIAGNOSIS — F321 Major depressive disorder, single episode, moderate: Secondary | ICD-10-CM

## 2018-02-24 MED ORDER — MIRTAZAPINE 15 MG PO TBDP
15.0000 mg | ORAL_TABLET | Freq: Every day | ORAL | Status: DC
  Filled 2018-02-24 (×2): qty 1

## 2018-02-24 MED ORDER — VENLAFAXINE HCL ER 37.5 MG PO CP24
37.5000 mg | ORAL_CAPSULE | Freq: Every day | ORAL | Status: DC
  Administered 2018-02-25: 37.5 mg via ORAL
  Filled 2018-02-24 (×2): qty 1

## 2018-02-24 MED ORDER — MAGNESIUM HYDROXIDE 400 MG/5ML PO SUSP: 30.0000 mL | Freq: Every day | ORAL | Status: DC | PRN

## 2018-02-24 MED ORDER — MIRTAZAPINE 15 MG PO TABS
15.0000 mg | ORAL_TABLET | Freq: Every day | ORAL | Status: DC
  Administered 2018-02-24 – 2018-02-26 (×3): 15 mg via ORAL
  Filled 2018-02-24 (×5): qty 1

## 2018-02-24 MED ORDER — HYDROXYZINE HCL 25 MG PO TABS
25.0000 mg | ORAL_TABLET | Freq: Three times a day (TID) | ORAL | Status: DC | PRN
  Administered 2018-02-24: 25 mg via ORAL
  Filled 2018-02-24: qty 1

## 2018-02-24 MED ORDER — ALUM & MAG HYDROXIDE-SIMETH 200-200-20 MG/5ML PO SUSP: 30.0000 mL | ORAL | Status: DC | PRN

## 2018-02-24 NOTE — Tx Team (Signed)
Initial Treatment Plan 02/24/2018 4:21 PM ALEXANDRE FARIES ZOX:[id number]    PATIENT STRESSORS: Medication change or noncompliance Occupational concerns   PATIENT STRENGTHS: Ability for insight Average or above average intelligence General fund of knowledge Supportive family/friends   PATIENT IDENTIFIED PROBLEMS: Confusion Worrying Not taking care of self "I'm not getting any better as far as the depression"                     DISCHARGE CRITERIA:  Ability to meet basic life and health needs Improved stabilization in mood, thinking, and/or behavior Verbal commitment to aftercare and medication compliance  PRELIMINARY DISCHARGE PLAN: Attend aftercare/continuing care group Return to previous living arrangement  PATIENT/FAMILY INVOLVEMENT: This treatment plan has been presented to and reviewed with the patient, Jonathon Griffin, and/or family member, .  The patient and family have been given the opportunity to ask questions and make suggestions.  Benedict Kue, [place], [place] 02/24/2018, 4:21 PM

## 2018-02-24 NOTE — BHH Counselor (Signed)
Adult Comprehensive Assessment  Patient ID: Jonathon Griffin, male   DOB: 1956-03-10, 62 y.o.   MRN: [id number]  Information Source: Information source: Patient  Current Stressors:  Patient states their primary concerns and needs for treatment are:: Depression Patient states their goals for this hospitilization and ongoing recovery are:: Medicine to feel better Educational / Learning stressors: Denies stressors Employment / Job issues: Very stressful - was laid off his job after 26 year and tried another job, could not do it. Family Relationships: Denies stressors Financial / Lack of resources (include bankruptcy): Very stressful because of job situation and doctor bills. Housing / Lack of housing: Denies stressors Physical health (include injuries & life threatening diseases): Has lost 40 pounds in 3 months, weakness Social relationships: Denies stressors, has shut everybody out. Substance abuse: Denies stressors Bereavement / Loss: Loss of job after 26 years  Living/Environment/Situation:  Living Arrangements: Spouse/significant other Living conditions (as described by patient or guardian): Good Who else lives in the home?: Wife How long has patient lived in current situation?: 2 years What is atmosphere in current home: Comfortable, Paramedic, Supportive  Family History:  Marital status: Married Number of Years Married: 31 What types of issues is patient dealing with in the relationship?: "She's reached her limit of watching me go downhill." What is your sexual orientation?: Straight Does patient have children?: Yes How many children?: 2 How is patient's relationship with their children?: step-children - okay relationship  Childhood History:  By whom was/is the patient raised?: Both parents Description of patient's relationship with caregiver when they were a child: Good with both Patient's description of current relationship with people who raised him/her: Good with both How  were you disciplined when you got in trouble as a child/adolescent?: Old fashioned way Does patient have siblings?: Yes Number of Siblings: 1 Description of patient's current relationship with siblings: sister - not close Did patient suffer any verbal/emotional/physical/sexual abuse as a child?: No Did patient suffer from severe childhood neglect?: No Has patient ever been sexually abused/assaulted/raped as an adolescent or adult?: No Was the patient ever a victim of a crime or a disaster?: No Witnessed domestic violence?: No Has patient been effected by domestic violence as an adult?: No  Education:  Highest grade of school patient has completed: Some college Currently a Consulting civil engineer?: No Learning disability?: No  Employment/Work Situation:   Employment situation: Unemployed What is the longest time patient has a held a job?: 26 years 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 Did You Receive Any Psychiatric Treatment/Services While in Equities trader?: No Are There Guns or Other Weapons in Your Home?: Yes Types of Guns/Weapons: But wife has confirmed they do not work Are These Comptroller?: Yes  Financial Resources:   Financial resources: Income from spouse, Private insurance([employer]) Does patient have a representative payee or guardian?: No  Alcohol/Substance Abuse:   What has been your use of drugs/alcohol within the last 12 months?: Denies all use Alcohol/Substance Abuse Treatment Hx: Denies past history If yes, describe treatment: Quit drinking 15 years ago Has alcohol/substance abuse ever caused legal problems?: Yes  Social Support System:   Patient's Community Support System: Good Describe Community Support System: Wife Type of faith/religion: Ephriam Knuckles How does patient's faith help to cope with current illness?: Prayer  Leisure/Recreation:   Leisure and Hobbies: make trails in the woods around his house  Strengths/Needs:   What is the patient's  perception of their strengths?: "I don't have any right now." Patient  states they can use these personal strengths during their treatment to contribute to their recovery: N/A Patient states these barriers may affect/interfere with their treatment: None Patient states these barriers may affect their return to the community: None Other important information patient would like considered in planning for their treatment: None  Discharge Plan:   Currently receiving community mental health services: No Patient states concerns and preferences for aftercare planning are: Willing to go for follow-up but unable to really discuss it - thinks wife should be part of this discussion, because he cannot drive right now and they live in [place]. Patient states they will know when they are safe and ready for discharge when: Doesn't know right now. Does patient have access to transportation?: Yes Does patient have financial barriers related to discharge medications?: Yes Patient description of barriers related to discharge medications: No income currently, wife's only, does have insurance Will patient be returning to same living situation after discharge?: Yes  Summary/Recommendations:   Summary and Recommendations (to be completed by the evaluator): Patient is a 62yo male admitted with severe depression and anxiety, would not answer questions about suicidal ideation.  Primary stressors include financial pressure since he was laid off from his job in July after 26 years of employment.  He tried a second shift job but felt his body could not take that toll, so he quit.  He is only sleeping a few hours at night even with Ambien as a sleep aid, has not been bathing or eating and has lost 30-40 pounds.  He is having multiple panic attacks daily and will not leave the house, so his wife is having to drive everywhere.  He has not drank alcohol in 15 years.  He is afraid his wife of 31 years is tired of dealing with this  problem, and is very stressed about hospital and doctor bills.  Patient will benefit from crisis stabilization, medication evaluation, group therapy and psychoeducation, in addition to case management for discharge planning. At discharge it is recommended that Patient adhere to the established discharge plan and continue in treatment.  Lynnell Chad. 02/24/2018

## 2018-02-24 NOTE — Progress Notes (Signed)
Arlin is a 62 year old male pt admitted on voluntary basis after presenting as a walk-in. Cary spoke about how he had a job loss earlier this year and reports that he has been depressed since then and reports that he is getting worse. Jahvon presents as hesitant and has difficulties answering some questions. Wife is in the room to provide collateral information and reports that he has been having an increase in confusion, inability to make decisions or care for himself and paces a lot throughout the day and reports this began after job loss. 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 She reports that he was giving medications to take but reports that he has not been taking them as he should. He denies any substance abuse issues. He reports that he lives with his wife and will return there after discharge. Maxamilian was escorted to the unit, oriented to the milieu and safety maintained.

## 2018-02-24 NOTE — BH Assessment (Signed)
Assessment Note  Jonathon Griffin is an 62 y.o. male who presented to [facility] with his wife.  Patient has been significantly depressed since July.  Patient was laid off from his company after twenty-six years of employment.  He tried to work a second shift job at [employer], but states that the position took a toll on his body so he quit. Since July, he has been decompensating.  He is only sleeping a few hours a night even with his sleep aide Ambien.  He has not been bathing and he is not eating and he has lost more than thirty pounds.  His wife is having to do all the driving and mowing of their property because patient will not leave the house.  Patient is also having multiple panic attacks per day.  Wife states that he was very health conscious, but is no longer going to the gym.  When asked if he has thought about killing himself, he is very evasive and will not answer the question.  He states, "I just need to get better."  Patient states that he has never tried to hurt himself in the past and he has no history of any psychiatric care in the past. He states that there are guns in his home, but they are not in working order and his wife, Jonathon Griffin, who was present during his assessment concurred that the guns do not work.  Patient denies any homicidal thoughts, any history of psychosis and he is not a substance user.  Patient presented as alert and oriented.  His mood is depressed and his affect is flat/blunted.  Patient indicated that he has decreased concentration and stated that it is hard for him to make decisions for himself.  His insight, judgment and impulse control are all impaired.  His memory was intact.  His thoughts were organized.  Patient was very soft spoken during the assessment and his eye contact was fair.  His psycho-motor activity was unremarkable .  Patient did not appear to be responding to any internal stimuli.   Diagnosis:  F32.2 Major Depressive Disorder Single Episode Severe without  psychotic features.  Past Medical History:  Past Medical History:  Diagnosis Date  . Arthritis   . Hypertension     No past surgical history on file.  Family History:  Family History  Problem Relation Age of Onset  . Arthritis Mother   . Arthritis Sister     Social History:  reports that he has never smoked. He has never used smokeless tobacco. He reports that he does not drink alcohol or use drugs.  Additional Social History:  Alcohol / Drug Use Pain Medications: see MAR Prescriptions: see MAR Over the Counter: see MAR History of alcohol / drug use?: No history of alcohol / drug abuse Longest period of sobriety (when/how long): N/A  CIWA:   COWS:    Allergies:  Allergies  Allergen Reactions  . Oxycodone-Acetaminophen     REACTION: itching  . Wellbutrin [Bupropion]     tremors    Home Medications:  (Not in a hospital admission)  OB/GYN Status:  No LMP for male patient.  General Assessment Data [facility] of Assessment[facility] Assessment Services TTS Assessment: In system Is this a Tele or Face-to-Face Assessment?: Face-to-Face Is this an Initial Assessment or a Re-assessment for this encounter?: Initial Assessment Patient Accompanied by:: Other(wife) Language Other than English: No Living Arrangements: Other (Comment)(lives in a home with his wife) What gender do you identify as?: Male  Marital status: Married Living Arrangements: Spouse/significant other Can pt return to current living arrangement?: Yes Admission Status: Voluntary Is patient capable of signing voluntary admission?: Yes Referral Source: Self/Family/Friend Insurance type: Administrator)     Crisis Care Plan Living Arrangements: Spouse/significant other Legal Guardian: Other:(self) Name of Psychiatrist: none Name of Therapist: none  Education Status Is patient currently in school?: No Is the patient employed, unemployed or receiving disability?: Unemployed  Risk to self with the past 6  months Suicidal Ideation: (vague answers when asked) Has patient been a risk to self within the past 6 months prior to admission? : No Suicidal Intent: No Is patient at risk for suicide?: Yes Suicidal Plan?: No Has patient had any suicidal plan within the past 6 months prior to admission? : No Access to Means: No What has been your use of drugs/alcohol within the last 12 months?: (none) Previous Attempts/Gestures: No How many times?: (0) Other Self Harm Risks: (loss of job) Triggers for Past Attempts: None known Intentional Self Injurious Behavior: None Family Suicide History: No Recent stressful life event(s): Job Loss, Financial Problems Persecutory voices/beliefs?: No Depression: Yes Depression Symptoms: Despondent, Isolating, Fatigue, Guilt, Loss of interest in usual pleasures, Feeling worthless/self pity Suicide prevention information given to non-admitted patients: Not applicable  Risk to Others within the past 6 months Homicidal Ideation: No Does patient have any lifetime risk of violence toward others beyond the six months prior to admission? : No Thoughts of Harm to Others: No Current Homicidal Intent: No Current Homicidal Plan: No Access to Homicidal Means: No Identified Victim: (none) History of harm to others?: No Assessment of Violence: None Noted Violent Behavior Description: none Does patient have access to weapons?: No Criminal Charges Pending?: No Does patient have a court date: No Is patient on probation?: No  Psychosis Hallucinations: None noted Delusions: None noted  Mental Status Report Appearance/Hygiene: Unremarkable Eye Contact: Poor Motor Activity: Freedom of movement Speech: Soft Level of Consciousness: Alert Mood: Depressed, Anxious, Anhedonia, Apathetic Affect: Anxious, Depressed, Flat Anxiety Level: Severe Thought Processes: Coherent, Relevant Judgement: Impaired Orientation: Person, Place, Time, Situation Obsessive Compulsive  Thoughts/Behaviors: None  Cognitive Functioning Concentration: Decreased Memory: Recent Intact, Remote Intact Is patient IDD: No Insight: Poor Impulse Control: Poor Appetite: Poor Have you had any weight changes? : Loss Amount of the weight change? (lbs): 30 lbs Sleep: Decreased Total Hours of Sleep: 2 Vegetative Symptoms: Not bathing, Decreased grooming  ADLScreening [facility] Assessment Services) Patient's cognitive ability adequate to safely complete daily activities?: Yes Patient able to express need for assistance with ADLs?: Yes Independently performs ADLs?: Yes (appropriate for developmental age)  Prior Inpatient Therapy Prior Inpatient Therapy: No  Prior Outpatient Therapy Prior Outpatient Therapy: No Does patient have an ACCT team?: No Does patient have Intensive In-House Services?  : No Does patient have [facility] services? : No Does patient have P4CC services?: No  ADL Screening (condition at time of admission) Patient's cognitive ability adequate to safely complete daily activities?: Yes Does the patient have difficulty seeing, even when wearing glasses/contacts?: No Does the patient have difficulty concentrating, remembering, or making decisions?: Yes Patient able to express need for assistance with ADLs?: Yes Independently performs ADLs?: Yes (appropriate for developmental age) Does the patient have difficulty walking or climbing stairs?: No Weakness of Legs: None Weakness of Arms/Hands: None  Home Assistive Devices/Equipment Home Assistive Devices/Equipment: None  Therapy Consults (therapy consults require a physician order) PT Evaluation Needed: No OT Evalulation Needed: No SLP Evaluation Needed: No Abuse/Neglect Assessment (Assessment  to be complete while patient is alone) Abuse/Neglect Assessment Can Be Completed: Yes Physical Abuse: Denies Verbal Abuse: Denies Sexual Abuse: Denies Exploitation of patient/patient's resources: Denies Self-Neglect:  Denies Values / Beliefs Cultural Requests During Hospitalization: None Spiritual Requests During Hospitalization: None Consults Spiritual Care Consult Needed: No Social Work Consult Needed: No Merchant navy officer (For Healthcare) Does Patient Have a Medical Advance Directive?: No Would patient like information on creating a medical advance directive?: No - Patient declined Nutrition Screen- [facility] Adult/[facility]/AP Has the patient recently lost weight without trying?: Yes, 24-33 lbs. Has the patient been eating poorly because of a decreased appetite?: Yes Malnutrition Screening Tool Score: 4        Disposition:  Per Hillery Jacks, Inpatient Treatment is Recommended Disposition Initial Assessment Completed for this Encounter: Yes Disposition of Patient: Admit Type of inpatient treatment program: Adult  On Site Evaluation by:   Reviewed with Physician:    Arnoldo Lenis Skyleen Bentley 02/24/2018 12:54 PM

## 2018-02-24 NOTE — H&P (Addendum)
Psychiatric Admission Assessment Adult  Patient Identification: OTHER ATIENZA MRN:  [id number] Date of Evaluation:  02/24/2018 Chief Complaint:  MDD Principal Diagnosis: MDD (major depressive disorder) Diagnosis:   Patient Active Problem List   Diagnosis Date Noted  . MDD (major depressive disorder) [F32.9] 02/24/2018  . Anxiety disorder [F41.9] 02/08/2018  . Abdominal pain [R10.9] 01/16/2018  . Weight loss, non-intentional [R63.4] 01/16/2018  . Well adult exam [Z00.00] 08/10/2017  . Warts [B07.9] 08/10/2017  . Acute bronchitis [J20.9] 07/07/2014  . Cough [R05] 07/07/2014  . Osteoarthritis [M19.90] 07/07/2014  . Polycythemia, secondary [D75.1] 03/06/2014  . Essential hypertension [I10] 03/06/2014  . Insomnia [G47.00] 08/16/2012  . Elevated blood pressure [R03.0] 06/14/2012  . Patient exposure to body fluids [Z77.21] 04/11/2012  . Situational depression [F43.21] 04/11/2012  . Hyperlipidemia [E78.5] 03/19/2009  . Alcohol abuse, in remission [F10.11] 03/19/2009  . Abnormal CBC measurement [R79.89] 03/19/2009  . Elevated AST (SGOT) [R74.0] 03/19/2009  . DYSPNEA [R06.09, R09.89] 02/15/2009  . PULMONARY FUNCTION TESTS, ABNORMAL [R94.2] 02/15/2009   History of Present Illness: COLETON WOON is an 62 y.o. male. Patient presents to the behavioral health walking with his wife.  Patient's wife has concerns with patient is experiencing worsening depression.  Reports multiple stressors states patient was recently laid off from his job continues to ruminate with finances.  States he has been isolative, and pacing the house a most of the day.  Reports patient was evaluated by his primary care 4 times with the past 2 weeks.  Reports patient was prescribed Celexa at his first visit however reports he has not taken medication as prescribed.  Reports subsequent visit was initiated on Wellbutrin and Lexapro.  Patient denies taking medication states is not working immediately.  Patient denies suicidal  or homicidal ideations.  Denies previous attempts/plan.  Denies previous inpatient admissions.  Reports a family history bipolar: nephew. Patient was encouraged just stay inpatient however has declined at this time.  Social work to provide additional outpatient resources.  Associated Signs/Symptoms: Depression Symptoms:  depressed mood, difficulty concentrating, hopelessness, anxiety, (Hypo) Manic Symptoms:  Distractibility, Anxiety Symptoms:  Excessive Worry, Psychotic Symptoms:  Hallucinations: None PTSD Symptoms: NA Total Time spent with patient: 15 minutes  Past Psychiatric History:   Is the patient at risk to self? No.  Has the patient been a risk to self in the past 6 months? No.  Has the patient been a risk to self within the distant past? No.  Is the patient a risk to others? No.  Has the patient been a risk to others in the past 6 months? No.  Has the patient been a risk to others within the distant past? No.   Prior Inpatient Therapy: Prior Inpatient Therapy: No Prior Outpatient Therapy: Prior Outpatient Therapy: No Does patient have an ACCT team?: No Does patient have Intensive In-House Services?  : No Does patient have [facility] services? : No Does patient have P4CC services?: No  Alcohol Screening:   Substance Abuse History in the last 12 months:  No. Consequences of Substance Abuse: NA Previous Psychotropic Medications: No  Psychological Evaluations: No  Past Medical History:  Past Medical History:  Diagnosis Date  . Arthritis   . Hypertension    No past surgical history on file. Family History:  Family History  Problem Relation Age of Onset  . Arthritis Mother   . Arthritis Sister    Family Psychiatric  History:  Tobacco Screening:   Social History:  Social History   Substance  and Sexual Activity  Alcohol Use No     Social History   Substance and Sexual Activity  Drug Use No    Additional Social History: Marital status: Married    Pain  Medications: see MAR Prescriptions: see MAR Over the Counter: see MAR History of alcohol / drug use?: No history of alcohol / drug abuse Longest period of sobriety (when/how long): N/A                    Allergies:   Allergies  Allergen Reactions  . Oxycodone-Acetaminophen     REACTION: itching  . Wellbutrin [Bupropion]     tremors   Lab Results: No results found for this or any previous visit (from the past 48 hour(s)).  Blood Alcohol level:  No results found for: [facility]  Metabolic Disorder Labs:  No results found for: HGBA1C, MPG No results found for: PROLACTIN Lab Results  Component Value Date   CHOL 229 (H) 08/10/2017   TRIG 180.0 (H) 08/10/2017   HDL 40.60 08/10/2017   CHOLHDL 6 08/10/2017   VLDL 36.0 08/10/2017   LDLCALC 152 (H) 08/10/2017   LDLCALC 152 (H) 03/31/2016    Current Medications: Current Facility-Administered Medications  Medication Dose Route Frequency Provider Last Rate Last Dose  . alum & mag hydroxide-simeth (MAALOX/MYLANTA) 200-200-20 MG/5ML suspension 30 mL  30 mL Oral Q4H PRN Derrill Center, NP      . hydrOXYzine (ATARAX/VISTARIL) tablet 25 mg  25 mg Oral TID PRN Derrill Center, NP      . magnesium hydroxide (MILK OF MAGNESIA) suspension 30 mL  30 mL Oral Daily PRN Derrill Center, NP      . mirtazapine (REMERON SOL-TAB) disintegrating tablet 15 mg  15 mg Oral QHS Derrill Center, NP      . Derrill Memo ON 02/25/2018] venlafaxine XR (EFFEXOR-XR) 24 hr capsule 37.5 mg  37.5 mg Oral Q breakfast Derrill Center, NP       PTA Medications: Medications Prior to Admission  Medication Sig Dispense Refill Last Dose  . Aspirin-Acetaminophen-Caffeine (GOODYS EXTRA STRENGTH) 500-325-65 MG PACK 1 bid pc 56 each 0 Taking  . Cholecalciferol (VITAMIN D3) 2000 units capsule Take 1 capsule (2,000 Units total) by mouth daily. 100 capsule 3 Taking  . losartan (COZAAR) 100 MG tablet Take 1 tablet (100 mg total) by mouth daily. 90 tablet 3 Taking  .  OLANZapine-FLUoxetine (SYMBYAX) 3-25 MG capsule Take 1 capsule by mouth every evening. Take 1 before or with dinner 30 capsule 5   . pantoprazole (PROTONIX) 40 MG tablet Take 1 tablet (40 mg total) by mouth daily. 30 tablet 5 Taking  . traMADol (ULTRAM) 50 MG tablet Take 1 tablet (50 mg total) by mouth every 6 (six) hours as needed. 120 tablet 3 Taking  . zolpidem (AMBIEN) 10 MG tablet TAKE 1 TABLET BY MOUTH AT BEDTIME AS NEEDED 30 tablet 5 Taking    Musculoskeletal: Strength & Muscle Tone: within normal limits Gait & Station: normal Patient leans: N/A  Psychiatric Specialty Exam: Physical Exam  Vitals reviewed. Constitutional: He is oriented to person, place, and time. He appears well-developed.  Cardiovascular: Normal rate.  Neurological: He is alert and oriented to person, place, and time.  Psychiatric: He has a normal mood and affect. His behavior is normal.    Review of Systems  Psychiatric/Behavioral: Positive for depression. Negative for hallucinations and suicidal ideas. The patient is nervous/anxious.   All other systems reviewed and are negative.  There were no vitals taken for this visit.There is no height or weight on file to calculate BMI.  General Appearance: Casual and Guarded  Eye Contact:  Good  Speech:  Clear and Coherent  Volume:  Normal  Mood:  Anxious and Depressed  Affect:  Congruent  Thought Process:  Coherent  Orientation:  Full (Time, Place, and Person)  Thought Content:  Hallucinations: None  Suicidal Thoughts:  No  Homicidal Thoughts:  No  Memory:  Immediate;   Fair Recent;   Fair Remote;   Fair  Judgement:  Fair  Insight:  Fair  Psychomotor Activity:  Normal  Concentration:  Concentration: Fair  Recall:  [employer] of Knowledge:  Fair  Language:  Fair  Akathisia:  No  Handed:  Right  AIMS (if indicated):     Assets:  Communication Skills Desire for Improvement Financial Resources/Insurance Social Support  ADL's:  Intact  Cognition:   WNL  Sleep:       Treatment Plan Summary: Daily contact with patient to assess and evaluate symptoms and progress in treatment and Medication management  Observation Level/Precautions:  15 minute checks  Laboratory:  CBC Chemistry Profile HCG UDS  Psychotherapy:  Individual and group session  Medications:  See SRA   Consultations:  CSW and Psychiatry   Discharge Concerns:  Safety, stabilization, and risk of access to medication and medication stabilization   Estimated LOS: 5-7 days   Other:     Physician Treatment Plan for Primary Diagnosis: MDD (major depressive disorder) Long Term Goal(s): Improvement in symptoms so as ready for discharge  Short Term Goals: Ability to identify changes in lifestyle to reduce recurrence of condition will improve, Ability to verbalize feelings will improve, Ability to maintain clinical measurements within normal limits will improve and Compliance with prescribed medications will improve  Physician Treatment Plan for Secondary Diagnosis: Principal Problem:   MDD (major depressive disorder)  Long Term Goal(s): Improvement in symptoms so as ready for discharge  Short Term Goals: Ability to identify changes in lifestyle to reduce recurrence of condition will improve, Ability to verbalize feelings will improve, Compliance with prescribed medications will improve and Ability to identify triggers associated with substance abuse/mental health issues will improve  I certify that inpatient services furnished can reasonably be expected to improve the patient's condition.    Derrill Center, NP 10/6/20191:16 PM  I have discussed case with NP and have met with patient  Agree with NP note and assessment  62 year old married male, has two adult stepchildren, lives with wife, currently unemployed after losing his job of 26 years in June of 2019, no legal issues.  He presented to [facility] voluntarily, at the encouragement of his wife . Reports worsening depression x  several months, primarily after becoming unemployed / being laid off from his job n June. He denies suicidal ideations. Does endorse significant neuro-vegetative symptoms of depression, as below. Endorses persistent sense of sadness,anhedonia, poor sleep, poor energy level, poor appetite, and states he has lost about 40 lbs over the last several months. Denies psychotic symptoms.  Reports that he has been  prescribed Wellbutrin , Lexapro, and most recently Symbiax over the past several weeks/months. States he took [employer], Radiographer, therapeutic for only a few days each, and that he stopped them because he thought they were contributing to poor appetite. He states most recent trial was Symbiax, which he has not taken in more than a week. He was prescribed Ambien for insomnia.  Past psychiatric history-  no prior psychiatric admissions, denies history of prior severe depressive episodes, denies history of psychosis. Denies history of mania .   Reports history of alcohol use disorder, now in sustained remission x 15 years, denies history of drug abuse .   Medical History- history of HTN, was  prescribed Losartan for HTN, but states he stopped it because his BP decreased related to weight loss over recent months. Had been prescribed Ultram for arthritic pain, but not taking recently. Reports allergy to percocet ( pruritus). He does not smoke.  Family history - mother has history of depression.  Dx- MDD, Severe, No Psychotic Features   Plan - Inpatient Admission. Started on Remeron 15 mgrs QHS for depression- may also help to alleviate insomnia and improve appetite, and Effexor XR 37.5 mgrs QDAY . Side effects reviewed.

## 2018-02-24 NOTE — Progress Notes (Signed)
D.  Pt appears very anxious on approach, wife here initially for visitation and asked many questions in the hopes of reassuring him.  Pt states that he is afraid to take any pills, states "I don't want to get any more messed up than I am".  Pt reassured about medication that is ordered.  Pt was positive for evening wrap up group with appropriate participation.  Pt remains very anxious at bedtime but denied need for Vistaril.  Pt reluctantly took scheduled medication -"I'm afraid I will sleep TOO well".    A.  Support and encouragement offered, medication given as ordered.   R.  Pt remains safe on the unit, will continue to monitor.

## 2018-02-24 NOTE — Progress Notes (Signed)
Adult Psychoeducational Group Note  Date:  02/24/2018 Time:  8:54 PM  Group Topic/Focus:  Wrap-Up Group:   The focus of this group is to help patients review their daily goal of treatment and discuss progress on daily workbooks.  Participation Level:  Active  Participation Quality:  Appropriate  Affect:  Appropriate  Cognitive:  Appropriate  Insight: Appropriate and Good  Engagement in Group:  Engaged  Modes of Intervention:  Discussion  Additional Comments:  If he had to rate his day it would be a 6. He is a new patient.  Charna Busman Long 02/24/2018, 8:54 PM

## 2018-02-24 NOTE — Telephone Encounter (Signed)
I noticed that the patient went to ER. Thank you

## 2018-02-24 NOTE — H&P (Addendum)
Behavioral Health Medical Screening Exam  Jonathon Griffin is an 62 y.o. male. Patient presents to the behavioral health walking with his wife.  Patient's wife has concerns with patient's worsening depression symptoms.  Reports patient is not eating or resting well.  Reports multiple stressors states patient was recently laid off from his job continues to ruminate with finances.  States he has been isolative most of the day.  Reports patient was evaluated by his primary care for times with the past 2 weeks.  Reports patient was prescribed Celexa at his first visit however reports he has not taken medication as prescribed.  Reports subsequent visit was initiated on Wellbutrin and Lexapro.  Patient denies taking medication states is not working immediately.  Patient denies suicidal or homicidal ideations.  Denies previous attempts/plan.  Denies previous inpatient admissions.  Reports a family history bipolar: nephew.  Patient was encouraged just stay inpatient however has declined at this time.  Social work to provide additional outpatient resources.  Total Time spent with patient: 15 minutes  Psychiatric Specialty Exam: Physical Exam  Constitutional: He is oriented to person, place, and time. He appears well-developed.  Cardiovascular: Normal rate.  Neurological: He is alert and oriented to person, place, and time.  Psychiatric: He has a normal mood and affect. His behavior is normal.    Review of Systems  Psychiatric/Behavioral: Positive for depression. Negative for hallucinations and suicidal ideas. The patient is nervous/anxious.   All other systems reviewed and are negative.   There were no vitals taken for this visit.There is no height or weight on file to calculate BMI.  General Appearance: Casual  Eye Contact:  Fair  Speech:  Clear and Coherent  Volume:  Normal  Mood:  Anxious  Affect:  Congruent  Thought Process:  Coherent  Orientation:  Full (Time, Place, and Person)  Thought  Content:  Hallucinations: None  Suicidal Thoughts:  No  Homicidal Thoughts:  No  Memory:  Immediate;   Fair Recent;   Fair Remote;   Fair  Judgement:  Fair  Insight:  Fair  Psychomotor Activity:  Normal  Concentration: Concentration: Fair  Recall:  [employer] of Knowledge:Fair  Language: Fair  Akathisia:  No  Handed:  Right  AIMS (if indicated):     Assets:  Communication Skills Desire for Improvement Resilience Social Support  Sleep:       Musculoskeletal: Strength & Muscle Tone: within normal limits Gait & Station: normal Patient leans: N/A  There were no vitals taken for this visit. BP 120/86 HR 76 RR18  Recommendations:  Based on my evaluation the patient does not appear to have an emergency medical condition.  Jonathon Rack, NP 02/24/2018, 12:49 PM   Agree with NP Assessment

## 2018-02-24 NOTE — BHH Suicide Risk Assessment (Signed)
[facility] Admission Suicide Risk Assessment   Nursing information obtained from:    62 year old married male, has two adult stepchildren, lives with wife, currently unemployed after losing his job of 26 years in June of 2019, no legal issues. Demographic factors:    see above  Current Mental Status:   see below Loss Factors:   unemployment  Historical Factors:   denies history of prior severe depressive episodes , denies history of mania, denies history of prior suicide attempts  Risk Reduction Factors:    resilience  Total Time spent with patient: 45 minutes Principal Problem: MDD (major depressive disorder) Diagnosis:   Patient Active Problem List   Diagnosis Date Noted  . MDD (major depressive disorder) [F32.9] 02/24/2018  . Anxiety disorder [F41.9] 02/08/2018  . Abdominal pain [R10.9] 01/16/2018  . Weight loss, non-intentional [R63.4] 01/16/2018  . Well adult exam [Z00.00] 08/10/2017  . Warts [B07.9] 08/10/2017  . Acute bronchitis [J20.9] 07/07/2014  . Cough [R05] 07/07/2014  . Osteoarthritis [M19.90] 07/07/2014  . Polycythemia, secondary [D75.1] 03/06/2014  . Essential hypertension [I10] 03/06/2014  . Insomnia [G47.00] 08/16/2012  . Elevated blood pressure [R03.0] 06/14/2012  . Patient exposure to body fluids [Z77.21] 04/11/2012  . Situational depression [F43.21] 04/11/2012  . Hyperlipidemia [E78.5] 03/19/2009  . Alcohol abuse, in remission [F10.11] 03/19/2009  . Abnormal CBC measurement [R79.89] 03/19/2009  . Elevated AST (SGOT) [R74.0] 03/19/2009  . DYSPNEA [R06.09, R09.89] 02/15/2009  . PULMONARY FUNCTION TESTS, ABNORMAL [R94.2] 02/15/2009   Subjective Data:   Continued Clinical Symptoms:    The "Alcohol Use Disorders Identification Test", Guidelines for Use in Primary Care, Second Edition.  World Science writer [facility]). Score between 0-7:  no or low risk or alcohol related problems. Score between 8-15:  moderate risk of alcohol related problems. Score between 16-19:   high risk of alcohol related problems. Score 20 or above:  warrants further diagnostic evaluation for alcohol dependence and treatment.   CLINICAL FACTORS:  62 year old married male, has two adult stepchildren, lives with wife, currently unemployed after losing his job of 26 years in June of 2019, no legal issues.  He presented to [facility] voluntarily, at the encouragement of his wife . Reports worsening depression x several months, primarily after becoming unemployed / being laid off from his job n June. He denies suicidal ideations. Does endorse significant neuro-vegetative symptoms of depression, as below. Endorses persistent sense of sadness,anhedonia, poor sleep, poor energy level, poor appetite, and states he has lost about 40 lbs over the last several months. Denies psychotic symptoms.  Reports that he has been  prescribed Wellbutrin , Lexapro, and most recently Symbiax over the  past several weeks/months. States he took [employer], Conservation officer, nature for only a few days each, and that he stopped them because he thought they were contributing to poor appetite. He states most recent trial was Symbiax, which he has not taken in more than a week. He was prescribed Ambien for insomnia.  Past psychiatric history- no prior psychiatric admissions, denies history of prior severe depressive episodes, denies history of psychosis. Denies history of mania .   Reports history of alcohol use disorder, now in sustained remission x 15 years, denies history of drug abuse .   Medical History- history of HTN, was  prescribed Losartan for HTN, but states he stopped it because his BP decreased related to weight loss over recent months. Had been prescribed Ultram for arthritic pain, but not taking recently. Reports allergy to percocet ( pruritus). He does  not smoke.  Family history - mother has history of depression.  Dx- MDD, Severe, No Psychotic Features   Plan - Inpatient Admission. Started on Remeron 15 mgrs  QHS for depression- may also help to alleviate insomnia and improve appetite, and Effexor XR 37.5 mgrs QDAY . Side effects reviewed.      Musculoskeletal: Strength & Muscle Tone: within normal limits Gait & Station: normal Patient leans: N/A  Psychiatric Specialty Exam: Physical Exam  Review of Systems  Constitutional: Positive for weight loss.  Respiratory: Negative for cough.   Cardiovascular: Negative for chest pain.  Gastrointestinal: Negative for abdominal pain, blood in stool, nausea and vomiting.  Genitourinary: Negative.   Musculoskeletal:       Lower back pain  Skin: Negative for rash.  Neurological: Negative for seizures and headaches.  Psychiatric/Behavioral: Positive for depression. The patient is nervous/anxious and has insomnia.     There were no vitals taken for this visit.There is no height or weight on file to calculate BMI.  General Appearance: Fairly Groomed  Eye Contact:  Fair  Speech:  Normal Rate  Volume:  Decreased  Mood:  Depressed  Affect:  constricted, anxious   Thought Process:  Linear and Descriptions of Associations: Intact  Orientation:  Other:  fully alert and attentive, he is oriented x 3   Thought Content:  no hallucinations, no delusions, ruminative   Suicidal Thoughts:  No denies suicidal ideations , denies self injurious ideations, denies homicidal ideations, contracts for safety on unit   Homicidal Thoughts:  No  Memory:  recent and remote grossly intact recall 3/3 immediate, 2/3 at 3 minutes   Judgement:  Fair  Insight:  Fair  Psychomotor Activity:  Decreased  Concentration:  Concentration: Good and Attention Span: Good  Recall:  Good  Fund of Knowledge:  Good  Language:  Good  Akathisia:  Negative  Handed:  Right  AIMS (if indicated):     Assets:  Communication Skills Desire for Improvement Resilience  ADL's:  Intact  Cognition:  WNL  Sleep:         COGNITIVE FEATURES THAT CONTRIBUTE TO RISK:  Closed-mindedness and Loss  of executive function    SUICIDE RISK:   Moderate:  Frequent suicidal ideation with limited intensity, and duration, some specificity in terms of plans, no associated intent, good self-control, limited dysphoria/symptomatology, some risk factors present, and identifiable protective factors, including available and accessible social support.  PLAN OF CARE: Patient will be admitted to inpatient psychiatric unit for stabilization and safety. Will provide and encourage milieu participation. Provide medication management and maked adjustments as needed.  Will follow daily.    I certify that inpatient services furnished can reasonably be expected to improve the patient's condition.   Craige Cotta, MD 02/24/2018, 3:54 PM

## 2018-02-25 LAB — COMPREHENSIVE METABOLIC PANEL
ALBUMIN: 3.4 g/dL — AB (ref 3.5–5.0)
ALT: 40 U/L (ref 0–44)
ANION GAP: 12 (ref 5–15)
AST: 22 U/L (ref 15–41)
Alkaline Phosphatase: 66 U/L (ref 38–126)
BUN: 15 mg/dL (ref 6–20)
CO2: 24 mmol/L (ref 22–32)
Calcium: 9.9 mg/dL (ref 8.9–10.3)
Chloride: 105 mmol/L (ref 98–111)
Creatinine, Ser: 1.07 mg/dL (ref 0.61–1.24)
GFR calc Af Amer: >60 mL/min (ref >60)
Glucose, Bld: 87 mg/dL (ref 70–99)
POTASSIUM: 3.9 mmol/L (ref 3.5–5.1)
Sodium: 141 mmol/L (ref 135–145)
Total Bilirubin: 1.1 mg/dL (ref 0.3–1.2)
Total Protein: 7.1 g/dL (ref 6.5–8.1)

## 2018-02-25 LAB — CBC WITH DIFFERENTIAL/PLATELET
Basophils Absolute: 0 10*3/uL (ref 0.0–0.1)
Basophils Relative: 0 %
EOS PCT: 3 %
Eosinophils Absolute: 0.2 10*3/uL (ref 0.0–0.7)
HEMATOCRIT: 47.4 % (ref 39.0–52.0)
HEMOGLOBIN: 16.1 g/dL (ref 13.0–17.0)
LYMPHS ABS: 2 10*3/uL (ref 0.7–4.0)
Lymphocytes Relative: 27 %
MCH: 30.3 pg (ref 26.0–34.0)
MCHC: 34 g/dL (ref 30.0–36.0)
MCV: 89.1 fL (ref 78.0–100.0)
Monocytes Absolute: 0.6 10*3/uL (ref 0.1–1.0)
Monocytes Relative: 8 %
Neutro Abs: 4.6 10*3/uL (ref 1.7–7.7)
Neutrophils Relative %: 62 %
Platelets: 314 10*3/uL (ref 150–400)
RBC: 5.32 MIL/uL (ref 4.22–5.81)
RDW: 13 % (ref 11.5–15.5)
WBC: 7.5 10*3/uL (ref 4.0–10.5)

## 2018-02-25 LAB — TSH: TSH: 2.327 u[IU]/mL (ref 0.350–4.500)

## 2018-02-25 MED ORDER — LORAZEPAM 0.5 MG PO TABS: 0.5000 mg | ORAL_TABLET | Freq: Four times a day (QID) | ORAL | Status: DC | PRN

## 2018-02-25 MED ORDER — ENSURE ENLIVE PO LIQD
237.0000 mL | Freq: Two times a day (BID) | ORAL | Status: DC
  Administered 2018-02-25 – 2018-02-27 (×6): 237 mL via ORAL

## 2018-02-25 MED ORDER — LORAZEPAM 1 MG PO TABS: 1.0000 mg | ORAL_TABLET | Freq: Three times a day (TID) | ORAL | Status: DC | PRN

## 2018-02-25 MED ORDER — VENLAFAXINE HCL ER 75 MG PO CP24
75.0000 mg | ORAL_CAPSULE | Freq: Every day | ORAL | Status: DC
  Administered 2018-02-26 – 2018-03-01 (×4): 75 mg via ORAL
  Filled 2018-02-25 (×6): qty 1

## 2018-02-25 NOTE — Plan of Care (Signed)
Nurse discussed anxiety, depression, coping skills with patient. 

## 2018-02-25 NOTE — Progress Notes (Signed)
D:  Patient's self inventory sheet, patient has poor sleep, sleep medication not helpful.  Poor appetite, low energy level, poor concentration.  Rated depression and hopeless 6, anxiety 3.  Denied withdrawals.  Denied SI.  Denied physical problems.  Denied physical pain.  Jonathon Griffin is to discharged.  Plans to attend meetings.  No discharge plans. A:  Medications administered per MD orders.  Emotional support and encouragement given patient. R:  Denied SI and HI, contracts for safety.  Denied A/V hallucinations.  Safety maintained with 15 minute checks.

## 2018-02-25 NOTE — Progress Notes (Signed)
[facility] MD Progress Note  02/25/2018 11:48 AM Jonathon Griffin  MRN:  [id number] Subjective: Patient reports ongoing depression, anxiety, ruminates about the cost of hospitalization and states "when I am going to be able to afford this".  Reports persistent anorexia, but does state he had "a little breakfast".  Denies suicidal ideations.  Denies medication side effects. Objective: I have discussed case with treatment team and have met with patient. 62 year old married male, presented to hospital for worsening depression times several months following being laid off from his long-term job in June.  Endorses significant neurovegetative symptoms of depression including anhedonia poor energy, poor sleep, and particularly anorexia/poor appetite with approximately 40 pound weight loss or recent months.  Patient presents depressed, anxious, ruminative.  He continues to endorse low energy, poor appetite, fair sleep.  Slept about 5 hours last night.  No psychotic symptoms noted or reported.  On unit has been anxious, ruminative, expressing anxiety about costs of hospitalization and possible medication side effects.  Did, with encouragement, take medications this a.m.  Denies side effects thus far. He denies suicidal ideations, contracts for safety on unit, and is future oriented, stating he hopes to be discharged soon in order to return home and to his wife. Labs reviewed- BMP, CBC unremarkable , TSH WNL   Principal Problem: MDD (major depressive disorder) Diagnosis:   Patient Active Problem List   Diagnosis Date Noted  . MDD (major depressive disorder) [F32.9] 02/24/2018  . Anxiety disorder [F41.9] 02/08/2018  . Abdominal pain [R10.9] 01/16/2018  . Weight loss, non-intentional [R63.4] 01/16/2018  . Well adult exam [Z00.00] 08/10/2017  . Warts [B07.9] 08/10/2017  . Acute bronchitis [J20.9] 07/07/2014  . Cough [R05] 07/07/2014  . Osteoarthritis [M19.90] 07/07/2014  . Polycythemia, secondary [D75.1]  03/06/2014  . Essential hypertension [I10] 03/06/2014  . Insomnia [G47.00] 08/16/2012  . Elevated blood pressure [R03.0] 06/14/2012  . Patient exposure to body fluids [Z77.21] 04/11/2012  . Situational depression [F43.21] 04/11/2012  . Hyperlipidemia [E78.5] 03/19/2009  . Alcohol abuse, in remission [F10.11] 03/19/2009  . Abnormal CBC measurement [R79.89] 03/19/2009  . Elevated AST (SGOT) [R74.0] 03/19/2009  . DYSPNEA [R06.09, R09.89] 02/15/2009  . PULMONARY FUNCTION TESTS, ABNORMAL [R94.2] 02/15/2009   Total Time spent with patient: 20 minutes  Past Psychiatric History:   Past Medical History:  Past Medical History:  Diagnosis Date  . Arthritis   . Hypertension    History reviewed. No pertinent surgical history. Family History:  Family History  Problem Relation Age of Onset  . Arthritis Mother   . Arthritis Sister    Family Psychiatric  History: Social History:  Social History   Substance and Sexual Activity  Alcohol Use No     Social History   Substance and Sexual Activity  Drug Use No    Social History   Socioeconomic History  . Marital status: Married    Spouse name: Not on file  . Number of children: Not on file  . Years of education: Not on file  . Highest education level: Not on file  Occupational History  . Not on file  Social Needs  . Financial resource strain: Not on file  . Food insecurity:    Worry: Not on file    Inability: Not on file  . Transportation needs:    Medical: Not on file    Non-medical: Not on file  Tobacco Use  . Smoking status: Never Smoker  . Smokeless tobacco: Never Used  Substance and Sexual Activity  . Alcohol  use: No  . Drug use: No  . Sexual activity: Yes  Lifestyle  . Physical activity:    Days per week: Not on file    Minutes per session: Not on file  . Stress: Not on file  Relationships  . Social connections:    Talks on phone: Not on file    Gets together: Not on file    Attends religious service: Not on  file    Active member of club or organization: Not on file    Attends meetings of clubs or organizations: Not on file    Relationship status: Not on file  Other Topics Concern  . Not on file  Social History Narrative  . Not on file   Additional Social History:    Pain Medications: see MAR Prescriptions: see MAR Over the Counter: see MAR History of alcohol / drug use?: No history of alcohol / drug abuse Longest period of sobriety (when/how long): N/A  Sleep: Fair  Appetite:  Poor  Current Medications: Current Facility-Administered Medications  Medication Dose Route Frequency Provider Last Rate Last Dose  . alum & mag hydroxide-simeth (MAALOX/MYLANTA) 200-200-20 MG/5ML suspension 30 mL  30 mL Oral Q4H PRN Lewis, Tanika N, NP      . feeding supplement (ENSURE ENLIVE) (ENSURE ENLIVE) liquid 237 mL  237 mL Oral BID BM Cobos, Fernando A, MD   237 mL at 02/25/18 1046  . LORazepam (ATIVAN) tablet 0.5 mg  0.5 mg Oral Q6H PRN Cobos, Fernando A, MD      . magnesium hydroxide (MILK OF MAGNESIA) suspension 30 mL  30 mL Oral Daily PRN Lewis, Tanika N, NP      . mirtazapine (REMERON) tablet 15 mg  15 mg Oral QHS Lewis, Tanika N, NP   15 mg at 02/24/18 2212  . [START ON 02/26/2018] venlafaxine XR (EFFEXOR-XR) 24 hr capsule 75 mg  75 mg Oral Q breakfast Cobos, Fernando A, MD        Lab Results:  Results for orders placed or performed during the hospital encounter of 02/24/18 (from the past 48 hour(s))  TSH     Status: None   Collection Time: 02/25/18  6:05 AM  Result Value Ref Range   TSH 2.327 0.350 - 4.500 uIU/mL    Comment: Performed by a 3rd Generation assay with a functional sensitivity of <=0.01 uIU/mL. Performed at [facility], [address] [place], [place] [address]   Comprehensive metabolic panel     Status: Abnormal   Collection Time: 02/25/18  6:05 AM  Result Value Ref Range   Sodium 141 135 - 145 mmol/L   Potassium 3.9 3.5 - 5.1 mmol/L   Chloride 105 98  - 111 mmol/L   CO2 24 22 - 32 mmol/L   Glucose, Bld 87 70 - 99 mg/dL   BUN 15 6 - 20 mg/dL   Creatinine, Ser 1.07 0.61 - 1.24 mg/dL   Calcium 9.9 8.9 - 10.3 mg/dL   Total Protein 7.1 6.5 - 8.1 g/dL   Albumin 3.4 (L) 3.5 - 5.0 g/dL   AST 22 15 - 41 U/L   ALT 40 0 - 44 U/L   Alkaline Phosphatase 66 38 - 126 U/L   Total Bilirubin 1.1 0.3 - 1.2 mg/dL   GFR calc non Af Amer >60 >60 mL/min   GFR calc Af Amer >60 >60 mL/min    Comment: (NOTE) The eGFR has been calculated using the CKD EPI equation. This calculation has not   been validated in all clinical situations. eGFR's persistently <60 mL/min signify possible Chronic Kidney Disease.    Anion gap 12 5 - 15    Comment: Performed at [facility], [place] [address], [place], [place] [address]  CBC with Differential/Platelet     Status: None   Collection Time: 02/25/18  6:05 AM  Result Value Ref Range   WBC 7.5 4.0 - 10.5 K/uL   RBC 5.32 4.22 - 5.81 MIL/uL   Hemoglobin 16.1 13.0 - 17.0 g/dL   HCT 47.4 39.0 - 52.0 %   MCV 89.1 78.0 - 100.0 fL   MCH 30.3 26.0 - 34.0 pg   MCHC 34.0 30.0 - 36.0 g/dL   RDW 13.0 11.5 - 15.5 %   Platelets 314 150 - 400 K/uL   Neutrophils Relative % 62 %   Neutro Abs 4.6 1.7 - 7.7 K/uL   Lymphocytes Relative 27 %   Lymphs Abs 2.0 0.7 - 4.0 K/uL   Monocytes Relative 8 %   Monocytes Absolute 0.6 0.1 - 1.0 K/uL   Eosinophils Relative 3 %   Eosinophils Absolute 0.2 0.0 - 0.7 K/uL   Basophils Relative 0 %   Basophils Absolute 0.0 0.0 - 0.1 K/uL    Comment: Performed at [facility], [place] [address], [place], [place] [address]    Blood Alcohol level:  No results found for: [facility]  Metabolic Disorder Labs: No results found for: HGBA1C, MPG No results found for: PROLACTIN Lab Results  Component Value Date   CHOL 229 (H) 08/10/2017   TRIG 180.0 (H) 08/10/2017   HDL 40.60 08/10/2017   CHOLHDL 6 08/10/2017   VLDL 36.0 08/10/2017   LDLCALC 152 (H) 08/10/2017   LDLCALC  152 (H) 03/31/2016    Physical Findings: AIMS: Facial and Oral Movements Muscles of Facial Expression: None, normal Lips and Perioral Area: None, normal Jaw: None, normal Tongue: None, normal,Extremity Movements Upper (arms, wrists, hands, fingers): None, normal Lower (legs, knees, ankles, toes): None, normal, Trunk Movements Neck, shoulders, hips: None, normal, Overall Severity Severity of abnormal movements (highest score from questions above): None, normal Incapacitation due to abnormal movements: None, normal Patient's awareness of abnormal movements (rate only patient's report): No Awareness, Dental Status Current problems with teeth and/or dentures?: No Does patient usually wear dentures?: No  CIWA:    COWS:     Musculoskeletal: Strength & Muscle Tone: within normal limits Gait & Station: normal Patient leans: N/A  Psychiatric Specialty Exam: Physical Exam  ROS denies headache, denies chest pain, no shortness of breath, does not today endorse abdominal pain or vomiting.    Blood pressure (!) 128/93, pulse (!) 109, temperature 97.8 F (36.6 C), temperature source Oral, resp. rate 16, height 5' 10" (1.778 m), weight 76.7 kg.Body mass index is 24.25 kg/m.  General Appearance: Fairly Groomed  Eye Contact:  Good  Speech:  Normal Rate  Volume:  Normal  Mood:  Depressed, anxious  Affect:  Congruent  Thought Process:  Linear and Descriptions of Associations: Intact  Orientation:  Full (Time, Place, and Person)  Thought Content:  No hallucinations, no delusions, positive anxious ruminations  Suicidal Thoughts:  No denies suicidal or self-injurious ideations, denies homicidal or violent ideations, contracts for safety on unit  Homicidal Thoughts:  No  Memory:  Recent and remote grossly intact  Judgement:  Fair  Insight:  Fair  Psychomotor Activity:  Normal, noted to be pacing at times  Concentration:  Concentration: Fair and Attention Span: Fair  Recall:  Good  Fund of  Knowledge:  Good  Language:  Good  Akathisia:  Negative  Handed:  Right  AIMS (if indicated):     Assets:  Desire for Improvement Resilience Social Support  ADL's:  Intact  Cognition:  WNL  Sleep:  Number of Hours: 4.75   Assessment - 62 year old married male, presented to hospital for worsening depression times several months following being laid off from his long-term job in June.  Endorses significant neurovegetative symptoms of depression including anhedonia poor energy, poor sleep, and particularly anorexia/poor appetite with approximately 40 pound weight loss or recent months.  Patient remains depressed, ruminative, anxious.  Continues to describe neurovegetative symptoms (fair sleep, poor appetite).  Needed reassurance/encouragement to take medications, denies side effects thus far.  BMP/CBC/TSH unremarkable.  Treatment Plan Summary: Daily contact with patient to assess and evaluate symptoms and progress in treatment, Medication management, Plan Inpatient treatment and Medications as below  Encourage group and milieu participation to work on coping skills and symptom reduction Increase Effexor XR to 75 mg daily for depression/anxiety Increase Ativan to 1 mg every 8 hours as needed for anxiety Continue Remeron 15 mg nightly for insomnia, depression Treatment team working on disposition [place], MD 02/25/2018, 11:48 AM

## 2018-02-25 NOTE — [facility] Suicide Risk Assessment (Signed)
[facility] INPATIENT:  Family/Significant Other Suicide Prevention Education  Suicide Prevention Education:  Education Completed; Jahmari Esbenshade, wife  [phone]) has been identified by the patient as the family member/significant other with whom the patient will be residing, and identified as the person(s) who will aid the patient in the event of a mental health crisis (suicidal ideations/suicide attempt).  With written consent from the patient, the family member/significant other has been provided the following suicide prevention education, prior to the and/or following the discharge of the patient.  The suicide prevention education provided includes the following:  Suicide risk factors  Suicide prevention and interventions  National Suicide Hotline telephone number  [facility] assessment telephone number  [facility] Emergency Assistance 911  [facility] and/or Residential Mobile Crisis Unit telephone number  Request made of family/significant other to:  Remove weapons (e.g., guns, rifles, knives), all items previously/currently identified as safety concern.    Remove drugs/medications (over-the-counter, prescriptions, illicit drugs), all items previously/currently identified as a safety concern.  The family member/significant other verbalizes understanding of the suicide prevention education information provided.  The family member/significant other agrees to remove the items of safety concern listed above.   Maeola Sarah 02/25/2018, 1:33 PM

## 2018-02-25 NOTE — Progress Notes (Addendum)
[facility] PROGRESS NOTE  Patient: Jonathon Griffin KGM:[id number]   PCP: Jonathon Garter, MD DOB: 27-Sep-1955   DOA: 02/24/2018   DOS: 02/25/2018   Assessment and plan: Called by Dr. Jama Flavors was concerned for progressive weight loss. At present she is currently being evaluated and managed for major depression. Patient's labs and hemodynamics appears reassuring. Also other than depression per Dr. Jama Flavors the patient does not have any other acute complaint. Recommend outpatient follow-up with PCP to rule out other causes for progressive weight loss.  Author: Lynden Oxford, MD [facility] Pager: [phone] 02/25/2018 4:04 PM   If 7PM-7AM, please contact night-coverage at [email or web address], password [email or web address]

## 2018-02-25 NOTE — Tx Team (Signed)
Interdisciplinary Treatment and Diagnostic Plan Update  02/25/2018 Time of Session:  Jonathon Griffin MRN: [id number]  Principal Diagnosis: MDD (major depressive disorder)  Secondary Diagnoses: Principal Problem:   MDD (major depressive disorder)   Current Medications:  Current Facility-Administered Medications  Medication Dose Route Frequency Provider Last Rate Last Dose  . alum & mag hydroxide-simeth (MAALOX/MYLANTA) 200-200-20 MG/5ML suspension 30 mL  30 mL Oral Q4H PRN Derrill Center, NP      . hydrOXYzine (ATARAX/VISTARIL) tablet 25 mg  25 mg Oral TID PRN Derrill Center, NP   25 mg at 02/24/18 1508  . magnesium hydroxide (MILK OF MAGNESIA) suspension 30 mL  30 mL Oral Daily PRN Derrill Center, NP      . mirtazapine (REMERON) tablet 15 mg  15 mg Oral QHS Derrill Center, NP   15 mg at 02/24/18 2212  . venlafaxine XR (EFFEXOR-XR) 24 hr capsule 37.5 mg  37.5 mg Oral Q breakfast Derrill Center, NP       PTA Medications: Medications Prior to Admission  Medication Sig Dispense Refill Last Dose  . Aspirin-Acetaminophen-Caffeine (GOODYS EXTRA STRENGTH) 500-325-65 MG PACK 1 bid pc 56 each 0 Taking  . Cholecalciferol (VITAMIN D3) 2000 units capsule Take 1 capsule (2,000 Units total) by mouth daily. 100 capsule 3 Taking  . losartan (COZAAR) 100 MG tablet Take 1 tablet (100 mg total) by mouth daily. 90 tablet 3 Taking  . OLANZapine-FLUoxetine (SYMBYAX) 3-25 MG capsule Take 1 capsule by mouth every evening. Take 1 before or with dinner 30 capsule 5   . pantoprazole (PROTONIX) 40 MG tablet Take 1 tablet (40 mg total) by mouth daily. 30 tablet 5 Taking  . traMADol (ULTRAM) 50 MG tablet Take 1 tablet (50 mg total) by mouth every 6 (six) hours as needed. 120 tablet 3 Taking  . zolpidem (AMBIEN) 10 MG tablet TAKE 1 TABLET BY MOUTH AT BEDTIME AS NEEDED 30 tablet 5 Taking    Patient Stressors: Medication change or noncompliance Occupational concerns  Patient Strengths: Ability for  insight Average or above average intelligence General fund of knowledge Supportive family/friends  Treatment Modalities: Medication Management, Group therapy, Case management,  1 to 1 session with clinician, Psychoeducation, Recreational therapy.   Physician Treatment Plan for Primary Diagnosis: MDD (major depressive disorder) Long Term Goal(s): Improvement in symptoms so as ready for discharge Improvement in symptoms so as ready for discharge   Short Term Goals: Ability to identify changes in lifestyle to reduce recurrence of condition will improve Ability to verbalize feelings will improve Ability to maintain clinical measurements within normal limits will improve Compliance with prescribed medications will improve Ability to identify changes in lifestyle to reduce recurrence of condition will improve Ability to verbalize feelings will improve Compliance with prescribed medications will improve Ability to identify triggers associated with substance abuse/mental health issues will improve  Medication Management: Evaluate patient's response, side effects, and tolerance of medication regimen.  Therapeutic Interventions: 1 to 1 sessions, Unit Group sessions and Medication administration.  Evaluation of Outcomes: Not Met  Physician Treatment Plan for Secondary Diagnosis: Principal Problem:   MDD (major depressive disorder)  Long Term Goal(s): Improvement in symptoms so as ready for discharge Improvement in symptoms so as ready for discharge   Short Term Goals: Ability to identify changes in lifestyle to reduce recurrence of condition will improve Ability to verbalize feelings will improve Ability to maintain clinical measurements within normal limits will improve Compliance with prescribed medications will improve Ability to  identify changes in lifestyle to reduce recurrence of condition will improve Ability to verbalize feelings will improve Compliance with prescribed  medications will improve Ability to identify triggers associated with substance abuse/mental health issues will improve     Medication Management: Evaluate patient's response, side effects, and tolerance of medication regimen.  Therapeutic Interventions: 1 to 1 sessions, Unit Group sessions and Medication administration.  Evaluation of Outcomes: Not Met   RN Treatment Plan for Primary Diagnosis: MDD (major depressive disorder) Long Term Goal(s): Knowledge of disease and therapeutic regimen to maintain health will improve  Short Term Goals: Ability to verbalize feelings will improve, Ability to disclose and discuss suicidal ideas, Ability to identify and develop effective coping behaviors will improve and Compliance with prescribed medications will improve  Medication Management: RN will administer medications as ordered by provider, will assess and evaluate patient's response and provide education to patient for prescribed medication. RN will report any adverse and/or side effects to prescribing provider.  Therapeutic Interventions: 1 on 1 counseling sessions, Psychoeducation, Medication administration, Evaluate responses to treatment, Monitor vital signs and CBGs as ordered, Perform/monitor CIWA, COWS, AIMS and Fall Risk screenings as ordered, Perform wound care treatments as ordered.  Evaluation of Outcomes: Not Met   LCSW Treatment Plan for Primary Diagnosis: MDD (major depressive disorder) Long Term Goal(s): Safe transition to appropriate next level of care at discharge, Engage patient in therapeutic group addressing interpersonal concerns.  Short Term Goals: Engage patient in aftercare planning with referrals and resources  Therapeutic Interventions: Assess for all discharge needs, 1 to 1 time with Social worker, Explore available resources and support systems, Assess for adequacy in community support network, Educate family and significant other(s) on suicide prevention, Complete  Psychosocial Assessment, Interpersonal group therapy.  Evaluation of Outcomes: Not Met   Progress in Treatment: Attending groups: Yes.  Participating in groups: Yes. Taking medication as prescribed: Yes. Toleration medication: Yes. Family/Significant other contact made: No, will contact:  the patient's wife Patient understands diagnosis: Yes. Discussing patient identified problems/goals with staff: Yes. Medical problems stabilized or resolved: Yes. Denies suicidal/homicidal ideation: Yes. Issues/concerns per patient self-inventory: No. Other:  New problem(s) identified: None   New Short Term/Long Term Goal(s): medication stabilization, elimination of SI thoughts, development of comprehensive mental wellness plan.   Patient Goals:    Discharge Plan or Barriers:  CSW will assess for appropriate referrals and discharge planning. Patient is concerned that his insurance will not cover outpatient services.   Reason for Continuation of Hospitalization: Anxiety Depression Medication stabilization Suicidal ideation  Estimated Length of Stay: 3-5 days   Attendees: Patient: 02/25/2018 8:31 AM  Physician: Dr. Neita Garnet, MD 02/25/2018 8:31 AM  Nursing: Rise Paganini.Raliegh Ip, RN 02/25/2018 8:31 AM  RN Care Manager: Jackelyn Knife, NP 02/25/2018 8:31 AM  Social Worker: Radonna Ricker, [place] 02/25/2018 8:31 AM  Recreational Therapist: Rhunette Croft 02/25/2018 8:31 AM  Other: Ricky Ala, NP 02/25/2018 8:31 AM  Other: X 02/25/2018 8:31 AM  Other: Rhunette Croft 02/25/2018 8:31 AM    Scribe for Treatment Team: Marylee Floras, [place] 02/25/2018 8:31 AM

## 2018-02-25 NOTE — [facility] Group Notes (Signed)
[facility] Group Notes:  (Nursing/MHT/Case Management/Adjunct)  Date:  02/25/2018  Time:  4:30 pm  Type of Therapy:  Psychoeducational Skills  Participation Level:  Active  Participation Quality:  Appropriate  Affect:  Appropriate  Cognitive:  Appropriate  Insight:  Appropriate  Engagement in Group:  Engaged  Modes of Intervention:  Education  Summary of Progress/Problems:  Patient attended group and participated in group appropriately.  Earline Mayotte 02/25/2018, 5:58 PM

## 2018-02-25 NOTE — Progress Notes (Signed)
Adult Psychoeducational Group Note  Date:  02/25/2018 Time:  8:36 PM  Group Topic/Focus:  Wrap-Up Group:   The focus of this group is to help patients review their daily goal of treatment and discuss progress on daily workbooks.  Participation Level:  Active  Participation Quality:  Appropriate  Affect:  Appropriate  Cognitive:  Appropriate  Insight: Appropriate  Engagement in Group:  Engaged  Modes of Intervention:  Discussion  Additional Comments: The patient expressed that he rates today a 9.The patient also said that he attended all groups.  Octavio Manns 02/25/2018, 8:36 PM

## 2018-02-25 NOTE — Progress Notes (Signed)
Patient ID: Jonathon Griffin, male   DOB: [birth year], 62 y.o.   MRN: [id number]  D: Patient has a flat affect on approach tonight. Reports that he feels he is better. Thought blocking noticed when speaking to him tonight and does have some trouble making decisions at times. No active SI and contracts for safety on the unit. His main concern is that his bed is so uncomfortable. A: Staff will continue to monitor on q 15 minute checks, follow treatment plan, and give medications as ordered. R: Cooperative on the unit

## 2018-02-25 NOTE — Progress Notes (Signed)
NUTRITION ASSESSMENT  Pt identified as at risk on the Malnutrition Screen Tool  INTERVENTION: 1. Supplements: Ensure Enlive po BID, each supplement provides 350 kcal and 20 grams of protein  NUTRITION DIAGNOSIS: Unintentional weight loss related to sub-optimal intake as evidenced by pt report.   Goal: Pt to meet >/= 90% of their estimated nutrition needs.  Monitor:  PO intake  Assessment:  Pt admitted with depression. Reports he has not been eating and has lost 30 lb. Per weight records, pt has lost 43 lb since 3/22 (20% wt loss x 6.5 months, significant for time frame). Pt would benefit from nutritional supplements, will order Ensure supplements.  Height: Ht Readings from Last 1 Encounters:  02/24/18 5\' 10"  (1.778 m)    Weight: Wt Readings from Last 1 Encounters:  02/24/18 76.7 kg    Weight Hx: Wt Readings from Last 10 Encounters:  02/24/18 76.7 kg  02/11/18 82.6 kg  02/08/18 82.6 kg  01/16/18 87.1 kg  08/10/17 96.2 kg  02/09/17 97.1 kg  08/04/16 100.8 kg  04/28/16 99.8 kg  03/17/16 100.7 kg  08/20/14 101.3 kg    BMI:  Body mass index is 24.25 kg/m. Pt meets criteria for normal based on current BMI.  Estimated Nutritional Needs: Kcal: 25-30 kcal/kg Protein: > 1 gram protein/kg Fluid: 1 ml/kcal  Diet Order:  Diet Order    None     Pt is also offered choice of unit snacks mid-morning and mid-afternoon.  Pt is eating as desired.   Lab results and medications reviewed.   Tilda Franco, MS, RD, LDN [facility] Inpatient Clinical Dietitian Pager: [phone] After Hours Pager: [phone]

## 2018-02-25 NOTE — [facility] Group Notes (Signed)
[facility] LCSW Group Therapy Note  Date/Time: 02/25/18, 1315  Type of Therapy and Topic:  Group Therapy:  Overcoming Obstacles  Participation Level:  moderate  Description of Group:    In this group patients will be encouraged to explore what they see as obstacles to their own wellness and recovery. They will be guided to discuss their thoughts, feelings, and behaviors related to these obstacles. The group will process together ways to cope with barriers, with attention given to specific choices patients can make. Each patient will be challenged to identify changes they are motivated to make in order to overcome their obstacles. This group will be process-oriented, with patients participating in exploration of their own experiences as well as giving and receiving support and challenge from other group members.  Therapeutic Goals: 1. Patient will identify personal and current obstacles as they relate to admission. 2. Patient will identify barriers that currently interfere with their wellness or overcoming obstacles.  3. Patient will identify feelings, thought process and behaviors related to these barriers. 4. Patient will identify two changes they are willing to make to overcome these obstacles:    Summary of Patient Progress: Pt shared that anxiety, employment, and finances are current obstacles.  Pt was mostly quiet during group discussion regarding positive ways to overcome obstacles, but pt did respond to CSW questions.      Therapeutic Modalities:   Cognitive Behavioral Therapy Solution Focused Therapy Motivational Interviewing Relapse Prevention Therapy  D[employer], LCSW

## 2018-02-26 MED ORDER — LORAZEPAM 0.5 MG PO TABS
0.5000 mg | ORAL_TABLET | Freq: Three times a day (TID) | ORAL | Status: DC | PRN
  Administered 2018-02-26 – 2018-02-28 (×2): 0.5 mg via ORAL
  Filled 2018-02-26 (×2): qty 1

## 2018-02-26 MED ORDER — BUSPIRONE HCL 5 MG PO TABS
5.0000 mg | ORAL_TABLET | Freq: Three times a day (TID) | ORAL | Status: DC
  Administered 2018-02-26 – 2018-02-27 (×4): 5 mg via ORAL
  Filled 2018-02-26 (×7): qty 1

## 2018-02-26 NOTE — Plan of Care (Signed)
Nurse and patient discussed his family life, concerns paying his hospital bills, and wife's concerns.

## 2018-02-26 NOTE — Progress Notes (Signed)
[facility] MD Progress Note  02/26/2018 12:23 PM Jonathon Griffin  MRN:  [id number] Subjective: Patient is seen and examined.  Patient is a 62 year old male with a past psychiatric history significant for major depression as well as generalized anxiety.  He is seen in follow-up.  He continues to ruminate about the cost of the hospitalization.  He continues to focus on that versus his depression.  We attempted to discuss the benefits versus the risk of being treated, and how this would affect his potential for being able to keep a job after hospitalization.  He continues to state that his wife tells him that he has to get out of the hospital.  I told him I would speak with his wife, but he is not interested in that.  We discussed increasing his medication, but he stated he "felt like a zombie".  He seems to be more anxious than depressed at this point, but his rumination about his financial issues keep getting involved.  He denied suicidal ideation.  Labs have been reviewed and they were all within normal limits. Principal Problem: MDD (major depressive disorder) Diagnosis:   Patient Active Problem List   Diagnosis Date Noted  . MDD (major depressive disorder) [F32.9] 02/24/2018  . Anxiety disorder [F41.9] 02/08/2018  . Abdominal pain [R10.9] 01/16/2018  . Weight loss, non-intentional [R63.4] 01/16/2018  . Well adult exam [Z00.00] 08/10/2017  . Warts [B07.9] 08/10/2017  . Acute bronchitis [J20.9] 07/07/2014  . Cough [R05] 07/07/2014  . Osteoarthritis [M19.90] 07/07/2014  . Polycythemia, secondary [D75.1] 03/06/2014  . Essential hypertension [I10] 03/06/2014  . Insomnia [G47.00] 08/16/2012  . Elevated blood pressure [R03.0] 06/14/2012  . Patient exposure to body fluids [Z77.21] 04/11/2012  . Situational depression [F43.21] 04/11/2012  . Hyperlipidemia [E78.5] 03/19/2009  . Alcohol abuse, in remission [F10.11] 03/19/2009  . Abnormal CBC measurement [R79.89] 03/19/2009  . Elevated AST (SGOT) [R74.0]  03/19/2009  . DYSPNEA [R06.09, R09.89] 02/15/2009  . PULMONARY FUNCTION TESTS, ABNORMAL [R94.2] 02/15/2009   Total Time spent with patient: 30 minutes  Past Psychiatric History: See admission H&P  Past Medical History:  Past Medical History:  Diagnosis Date  . Arthritis   . Hypertension    History reviewed. No pertinent surgical history. Family History:  Family History  Problem Relation Age of Onset  . Arthritis Mother   . Arthritis Sister    Family Psychiatric  History: See admission H&P Social History:  Social History   Substance and Sexual Activity  Alcohol Use No     Social History   Substance and Sexual Activity  Drug Use No    Social History   Socioeconomic History  . Marital status: Married    Spouse name: Not on file  . Number of children: Not on file  . Years of education: Not on file  . Highest education level: Not on file  Occupational History  . Not on file  Social Needs  . Financial resource strain: Not on file  . Food insecurity:    Worry: Not on file    Inability: Not on file  . Transportation needs:    Medical: Not on file    Non-medical: Not on file  Tobacco Use  . Smoking status: Never Smoker  . Smokeless tobacco: Never Used  Substance and Sexual Activity  . Alcohol use: No  . Drug use: No  . Sexual activity: Yes  Lifestyle  . Physical activity:    Days per week: Not on file    Minutes per  session: Not on file  . Stress: Not on file  Relationships  . Social connections:    Talks on phone: Not on file    Gets together: Not on file    Attends religious service: Not on file    Active member of club or organization: Not on file    Attends meetings of clubs or organizations: Not on file    Relationship status: Not on file  Other Topics Concern  . Not on file  Social History Narrative  . Not on file   Additional Social History:    Pain Medications: see MAR Prescriptions: see MAR Over the Counter: see MAR History of alcohol /  drug use?: No history of alcohol / drug abuse Longest period of sobriety (when/how long): N/A                    Sleep: Fair  Appetite:  Fair  Current Medications: Current Facility-Administered Medications  Medication Dose Route Frequency Provider Last Rate Last Dose  . alum & mag hydroxide-simeth (MAALOX/MYLANTA) 200-200-20 MG/5ML suspension 30 mL  30 mL Oral Q4H PRN Derrill Center, NP      . busPIRone (BUSPAR) tablet 5 mg  5 mg Oral TID Sharma Covert, MD      . feeding supplement (ENSURE ENLIVE) (ENSURE ENLIVE) liquid 237 mL  237 mL Oral BID BM Cobos, Myer Peer, MD   237 mL at 02/26/18 1005  . LORazepam (ATIVAN) tablet 0.5 mg  0.5 mg Oral Q8H PRN Sharma Covert, MD      . magnesium hydroxide (MILK OF MAGNESIA) suspension 30 mL  30 mL Oral Daily PRN Derrill Center, NP      . mirtazapine (REMERON) tablet 15 mg  15 mg Oral QHS Derrill Center, NP   15 mg at 02/25/18 2153  . venlafaxine XR (EFFEXOR-XR) 24 hr capsule 75 mg  75 mg Oral Q breakfast Cobos, Myer Peer, MD   75 mg at 02/26/18 0730    Lab Results:  Results for orders placed or performed during the hospital encounter of 02/24/18 (from the past 48 hour(s))  TSH     Status: None   Collection Time: 02/25/18  6:05 AM  Result Value Ref Range   TSH 2.327 0.350 - 4.500 uIU/mL    Comment: Performed by a 3rd Generation assay with a functional sensitivity of <=0.01 uIU/mL. Performed at [facility] [address], [place] [address], [place], [place] [address]   Comprehensive metabolic panel     Status: Abnormal   Collection Time: 02/25/18  6:05 AM  Result Value Ref Range   Sodium 141 135 - 145 mmol/L   Potassium 3.9 3.5 - 5.1 mmol/L   Chloride 105 98 - 111 mmol/L   CO2 24 22 - 32 mmol/L   Glucose, Bld 87 70 - 99 mg/dL   BUN 15 6 - 20 mg/dL   Creatinine, Ser 1.07 0.61 - 1.24 mg/dL   Calcium 9.9 8.9 - 10.3 mg/dL   Total Protein 7.1 6.5 - 8.1 g/dL   Albumin 3.4 (L) 3.5 - 5.0 g/dL   AST 22 15 - 41 U/L   ALT 40  0 - 44 U/L   Alkaline Phosphatase 66 38 - 126 U/L   Total Bilirubin 1.1 0.3 - 1.2 mg/dL   GFR calc non Af Amer >60 >60 mL/min   GFR calc Af Amer >60 >60 mL/min    Comment: (NOTE) The eGFR has been calculated using the CKD  EPI equation. This calculation has not been validated in all clinical situations. eGFR's persistently <60 mL/min signify possible Chronic Kidney Disease.    Anion gap 12 5 - 15    Comment: Performed at [facility], [place] [address] [place], [place] [address]  CBC with Differential/Platelet     Status: None   Collection Time: 02/25/18  6:05 AM  Result Value Ref Range   WBC 7.5 4.0 - 10.5 K/uL   RBC 5.32 4.22 - 5.81 MIL/uL   Hemoglobin 16.1 13.0 - 17.0 g/dL   HCT 47.4 39.0 - 52.0 %   MCV 89.1 78.0 - 100.0 fL   MCH 30.3 26.0 - 34.0 pg   MCHC 34.0 30.0 - 36.0 g/dL   RDW 13.0 11.5 - 15.5 %   Platelets 314 150 - 400 K/uL   Neutrophils Relative % 62 %   Neutro Abs 4.6 1.7 - 7.7 K/uL   Lymphocytes Relative 27 %   Lymphs Abs 2.0 0.7 - 4.0 K/uL   Monocytes Relative 8 %   Monocytes Absolute 0.6 0.1 - 1.0 K/uL   Eosinophils Relative 3 %   Eosinophils Absolute 0.2 0.0 - 0.7 K/uL   Basophils Relative 0 %   Basophils Absolute 0.0 0.0 - 0.1 K/uL    Comment: Performed at [facility], [place] [address] [place], [place] [address]    Blood Alcohol level:  No results found for: [facility]  Metabolic Disorder Labs: No results found for: HGBA1C, MPG No results found for: PROLACTIN Lab Results  Component Value Date   CHOL 229 (H) 08/10/2017   TRIG 180.0 (H) 08/10/2017   HDL 40.60 08/10/2017   CHOLHDL 6 08/10/2017   VLDL 36.0 08/10/2017   LDLCALC 152 (H) 08/10/2017   LDLCALC 152 (H) 03/31/2016    Physical Findings: AIMS: Facial and Oral Movements Muscles of Facial Expression: None, normal Lips and Perioral Area: None, normal Jaw: None, normal Tongue: None, normal,Extremity Movements Upper (arms, wrists, hands, fingers): None,  normal Lower (legs, knees, ankles, toes): None, normal, Trunk Movements Neck, shoulders, hips: None, normal, Overall Severity Severity of abnormal movements (highest score from questions above): None, normal Incapacitation due to abnormal movements: None, normal Patient's awareness of abnormal movements (rate only patient's report): No Awareness, Dental Status Current problems with teeth and/or dentures?: No Does patient usually wear dentures?: No  CIWA:  CIWA-Ar Total: 1 COWS:  COWS Total Score: 2  Musculoskeletal: Strength & Muscle Tone: within normal limits Gait & Station: normal Patient leans: N/A  Psychiatric Specialty Exam: Physical Exam  Nursing note and vitals reviewed. Constitutional: He is oriented to person, place, and time. He appears well-developed and well-nourished.  HENT:  Head: Normocephalic and atraumatic.  Respiratory: Effort normal.  Neurological: He is alert and oriented to person, place, and time.    ROS  Blood pressure 125/89, pulse 99, temperature (!) 97.5 F (36.4 C), resp. rate 16, height 5' 10"  (1.778 m), weight 76.7 kg.Body mass index is 24.25 kg/m.  General Appearance: Casual  Eye Contact:  Fair  Speech:  Normal Rate  Volume:  Decreased  Mood:  Anxious and Depressed  Affect:  Constricted  Thought Process:  Coherent and Descriptions of Associations: Intact  Orientation:  Full (Time, Place, and Person)  Thought Content:  Rumination  Suicidal Thoughts:  No  Homicidal Thoughts:  No  Memory:  Immediate;   Fair Recent;   Fair Remote;   Fair  Judgement:  Impaired  Insight:  Lacking  Psychomotor Activity:  Increased  Concentration:  Concentration: Fair and Attention Span: Fair  Recall:  [employer] of Knowledge:  Fair  Language:  Fair  Akathisia:  Negative  Handed:  Right  AIMS (if indicated):     Assets:  Desire for Improvement Housing Physical Health Resilience Social Support  ADL's:  Intact  Cognition:  WNL  Sleep:  Number of Hours:  4.75     Treatment Plan Summary: Daily contact with patient to assess and evaluate symptoms and progress in treatment, Medication management and Plan : Patient is seen and examined.  Patient is a 62 year old male with a past psychiatric history significant for major depression; severe and generalized anxiety disorder.  #1 major depression-he continues to be significantly anxious and depressed.  His Effexor XR was just increased yesterday.  We will continue 75 mg p.o. daily for mood and anxiety.  He also continues on Remeron 15 mg p.o. nightly for insomnia and depression.  #2 generalized anxiety disorder-Effexor XR, Remeron as per above.  He stated he felt like a zombie, so I will decrease his Ativan to 0.5 mg p.o. every 8 hours as needed for anxiety.  I will also add BuSpar 5 mg p.o. 3 times daily for anxiety.  #3 weight loss-there has been concern to get a CT scan of his abdomen, but the patient is ruminating so much for expense that I feel like he would decline it.  Hopefully if his mood and anxiety improves we will be able to get this done prior to discharge.  #4 discharge planning-I am hoping that we can see some benefit in the medications in the next day or 2, and we can have him out of the hospital in 2 to 3 days.  Sharma Covert, MD 02/26/2018, 12:23 PM

## 2018-02-26 NOTE — [facility] Group Notes (Signed)
LCSW Group Therapy Note 02/26/2018 10:28 AM  Type of Therapy/Topic: Group Therapy: Feelings about Diagnosis  Participation Level: Active   Description of Group:  This group will allow patients to explore their thoughts and feelings about diagnoses they have received. Patients will be guided to explore their level of understanding and acceptance of these diagnoses. Facilitator will encourage patients to process their thoughts and feelings about the reactions of others to their diagnosis and will guide patients in identifying ways to discuss their diagnosis with significant others in their lives. This group will be process-oriented, with patients participating in exploration of their own experiences, giving and receiving support, and processing challenge from other group members.  Therapeutic Goals: 1. Patient will demonstrate understanding of diagnosis as evidenced by identifying two or more symptoms of the disorder 2. Patient will be able to express two feelings regarding the diagnosis 3. Patient will demonstrate their ability to communicate their needs through discussion and/or role play  Summary of Patient Progress:  Jonathon Griffin was engaged and participated throughout the group session. Jonathon Griffin reports that he has struggled with depression since losing his job over the summer. Jonathon Griffin reports that he no longer has an appetite.     Therapeutic Modalities:  Cognitive Behavioral Therapy Brief Therapy Feelings Identification    Jonathon Griffin [name] Clinical Social Worker

## 2018-02-26 NOTE — Progress Notes (Addendum)
Adult Psychoeducational Group Note  Date:  02/26/2018 Time:  9:10 PM  Group Topic/Focus:  Wrap-Up Group:   The focus of this group is to help patients review their daily goal of treatment and discuss progress on daily workbooks.  Participation Level:  Active  Participation Quality:  Appropriate  Affect:  Appropriate  Cognitive:  Appropriate  Insight: Limited  Engagement in Group:  Lacking  Modes of Intervention:  Discussion  Additional Comments:  Patient attended wrap-up group and said that his day was a 6. Patient said he attended group today and stayed positive.   Aaliah Jorgenson W Dovie Kapusta 02/26/2018, 9:10 PM

## 2018-02-26 NOTE — Progress Notes (Addendum)
D:  Patient's self inventory sheet, patient has poor sleep, sleep medication not helpful.  Poor appetite, low energy level, poor concentration.  Rated depression, hopeless and anxiety #6.  Denied withdrawals.  Denied SI.  Denied physical problems.  Denied physical pain.  Goal is discharge.  Plans to attend meetings.  No discharge plans. A:  Medications administered per MD orders.  Emotional support and encouragement given patient. R:  Denied SI and HI, contracts for safety.  Denied A/V hallucinations.  Safety maintained with 15 minute checks.  Patient told nurse that he is worried about his wife of 31 yrs.  He helped raise her 2 children.  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  After finding another job and working 2nd shift, patient quit because 2nd shift was "too rough".  Patient stated he wants to make progress and be discharged.  Patient's main concerns are his insurance not paying his hospital bill and money to pay his other bills. Patient has been calm and cooperative.   Patient does sit on the side of his bed and look at the wall.  Patient responds faster to questions today from staff.

## 2018-02-26 NOTE — [facility] Group Notes (Signed)
Pt attended spiritual care group on grief and loss facilitated by chaplain Alfreda Hammad   Group goal of establishing open and affirming space for members to share loss and experience with grief, normalize grief experience and provide psycho social education and grief support.  Group opened with facilitated discussion and psycho-social ed around grief and loss.  Group noted loss in connection to relationships and in relation to self.  Group identifyed life patterns, circumstances, changes that precipitate grief responses.   Group engaged in reflection on Worden's four tasks of grief.  Provided support to one another in group context.    Group facilitation drew on Narrative and Adlerian framework.    Jonathon Griffin,  MDiv, BCC 

## 2018-02-26 NOTE — [facility] Group Notes (Signed)
Adult Psychoeducational Group Note  Date:  02/26/2018 Time:  9:06 AM  Group Topic/Focus:  Goals Group:   The focus of this group is to help patients establish daily goals to achieve during treatment and discuss how the patient can incorporate goal setting into their daily lives to aide in recovery.  Participation Level:  Active  Participation Quality:  Appropriate  Affect:  Appropriate  Cognitive:  Alert  Insight: Appropriate  Engagement in Group:  Engaged  Modes of Intervention:  Orientation  Additional Comments:  Pt attended and participated in orientation/goals group facilitated by MHT AJ.  Dellia Nims 02/26/2018, 9:06 AM

## 2018-02-27 MED ORDER — MIRTAZAPINE 30 MG PO TABS
30.0000 mg | ORAL_TABLET | Freq: Every day | ORAL | Status: DC
  Administered 2018-02-27: 30 mg via ORAL
  Filled 2018-02-27 (×2): qty 1

## 2018-02-27 MED ORDER — BUSPIRONE HCL 10 MG PO TABS
10.0000 mg | ORAL_TABLET | Freq: Three times a day (TID) | ORAL | Status: DC
  Administered 2018-02-27: 10 mg via ORAL
  Filled 2018-02-27 (×5): qty 1

## 2018-02-27 NOTE — [facility] Group Notes (Addendum)
Adult Psychoeducational Group Note  Date:  02/27/2018 Time:  5:00 PM  Group Topic/Focus: Education officer, museum a Wellness Toolbox:   The focus of this group is to help patients develop a "wellness toolbox" with skills and strategies to promote recovery upon discharge.  Participation Level:  Did Not Attend  Additional Comments:  Patient was invited but did not attend group.  Marchelle Folks A Kervens Roper 02/27/2018, 6:00 PM

## 2018-02-27 NOTE — Progress Notes (Signed)
Recreation Therapy Notes  Date: 10.9.19 Time: 0930 Location: [address] Dayroom  Group Topic: Stress Management  Goal Area(s) Addresses:  Patient will verbalize importance of using healthy stress management.  Patient will identify positive emotions associated with healthy stress management.   Intervention: Stress Management  Activity :  Guided Imagery.  LRT introduced the stress management technique of guided imagery.  LRT read a script that allowed patients to envision a starry sky at night.  Patients were to listen as the script was read to engage in the activity.  Education:  Stress Management, Discharge Planning.   Education Outcome: Acknowledges edcuation/In group clarification offered/Needs additional education  Clinical Observations/Feedback:  Pt did not attend group.      Caroll Rancher, LRT/CTRS         Caroll Rancher A 02/27/2018 12:33 PM

## 2018-02-27 NOTE — [facility] Group Notes (Signed)
[facility] Mental Health Association Group Therapy 02/27/2018 1:15pm  Type of Therapy: Mental Health Association Presentation  Participation Level: Active  Participation Quality: Attentive  Affect: Appropriate  Cognitive: Oriented  Insight: Developing/Improving  Engagement in Therapy: Engaged  Modes of Intervention: Discussion, Education and Socialization  Summary of Progress/Problems: Mental Health Association (MHA) Speaker came to talk about his personal journey with mental health. The pt processed ways by which to relate to the speaker. MHA speaker provided handouts and educational information pertaining to groups and services offered by the [facility]. Pt was engaged in speaker's presentation and was receptive to resources provided.    Rona Ravens, LCSW 02/27/2018 10:06 AM

## 2018-02-27 NOTE — Progress Notes (Signed)
[facility] MD Progress Note  02/27/2018 12:31 PM Jonathon Griffin  MRN:  [id number] Subjective: Patient is seen and examined.  Patient is a 62 year old male with a past psychiatric history significant for major depression as well as generalized anxiety disorder.  He is seen in follow-up.  He is essentially unchanged from yesterday.  I spoke to his wife yesterday, and it seems as though this depression and anxiety has been going on for quite a while.  He continues to ruminate about the cost of the hospitalization.  Despite having reduce the lorazepam he still feels like he is a zombie.  His wife stated that she and their family have tried to encourage her to be more positive, but he still worries about the cost of the hospitalization.  I suggested that he would probably go home on Friday since he is no longer having any kind of suicidal ideation.  He is unable to accept that, and thinks that I am not being honest with him.  He still thinks that the cost of the hospitalization will "ruin him".  We discussed increasing the BuSpar to attempt to control his anxiety without having him feel drugged, and is well increasing his Remeron to 30 mg to reduce the sedation part of that medication. Principal Problem: MDD (major depressive disorder) Diagnosis:   Patient Active Problem List   Diagnosis Date Noted  . MDD (major depressive disorder) [F32.9] 02/24/2018  . Anxiety disorder [F41.9] 02/08/2018  . Abdominal pain [R10.9] 01/16/2018  . Weight loss, non-intentional [R63.4] 01/16/2018  . Well adult exam [Z00.00] 08/10/2017  . Warts [B07.9] 08/10/2017  . Acute bronchitis [J20.9] 07/07/2014  . Cough [R05] 07/07/2014  . Osteoarthritis [M19.90] 07/07/2014  . Polycythemia, secondary [D75.1] 03/06/2014  . Essential hypertension [I10] 03/06/2014  . Insomnia [G47.00] 08/16/2012  . Elevated blood pressure [R03.0] 06/14/2012  . Patient exposure to body fluids [Z77.21] 04/11/2012  . Situational depression [F43.21] 04/11/2012   . Hyperlipidemia [E78.5] 03/19/2009  . Alcohol abuse, in remission [F10.11] 03/19/2009  . Abnormal CBC measurement [R79.89] 03/19/2009  . Elevated AST (SGOT) [R74.0] 03/19/2009  . DYSPNEA [R06.09, R09.89] 02/15/2009  . PULMONARY FUNCTION TESTS, ABNORMAL [R94.2] 02/15/2009   Total Time spent with patient: 30 minutes  Past Psychiatric History: See admission H&P  Past Medical History:  Past Medical History:  Diagnosis Date  . Arthritis   . Hypertension    History reviewed. No pertinent surgical history. Family History:  Family History  Problem Relation Age of Onset  . Arthritis Mother   . Arthritis Sister    Family Psychiatric  History: See admission H&P Social History:  Social History   Substance and Sexual Activity  Alcohol Use No     Social History   Substance and Sexual Activity  Drug Use No    Social History   Socioeconomic History  . Marital status: Married    Spouse name: Not on file  . Number of children: Not on file  . Years of education: Not on file  . Highest education level: Not on file  Occupational History  . Not on file  Social Needs  . Financial resource strain: Not on file  . Food insecurity:    Worry: Not on file    Inability: Not on file  . Transportation needs:    Medical: Not on file    Non-medical: Not on file  Tobacco Use  . Smoking status: Never Smoker  . Smokeless tobacco: Never Used  Substance and Sexual Activity  . Alcohol  use: No  . Drug use: No  . Sexual activity: Yes  Lifestyle  . Physical activity:    Days per week: Not on file    Minutes per session: Not on file  . Stress: Not on file  Relationships  . Social connections:    Talks on phone: Not on file    Gets together: Not on file    Attends religious service: Not on file    Active member of club or organization: Not on file    Attends meetings of clubs or organizations: Not on file    Relationship status: Not on file  Other Topics Concern  . Not on file   Social History Narrative  . Not on file   Additional Social History:    Pain Medications: see MAR Prescriptions: see MAR Over the Counter: see MAR History of alcohol / drug use?: No history of alcohol / drug abuse Longest period of sobriety (when/how long): N/A                    Sleep: Fair  Appetite:  Fair  Current Medications: Current Facility-Administered Medications  Medication Dose Route Frequency Provider Last Rate Last Dose  . alum & mag hydroxide-simeth (MAALOX/MYLANTA) 200-200-20 MG/5ML suspension 30 mL  30 mL Oral Q4H PRN Oneta Rack, NP      . busPIRone (BUSPAR) tablet 10 mg  10 mg Oral TID Antonieta Pert, MD      . feeding supplement (ENSURE ENLIVE) (ENSURE ENLIVE) liquid 237 mL  237 mL Oral BID BM Cobos, Rockey Situ, MD   237 mL at 02/27/18 1040  . LORazepam (ATIVAN) tablet 0.5 mg  0.5 mg Oral Q8H PRN Antonieta Pert, MD   0.5 mg at 02/26/18 2218  . magnesium hydroxide (MILK OF MAGNESIA) suspension 30 mL  30 mL Oral Daily PRN Oneta Rack, NP      . mirtazapine (REMERON) tablet 30 mg  30 mg Oral QHS Antonieta Pert, MD      . venlafaxine XR [facility]) 24 hr capsule 75 mg  75 mg Oral Q breakfast Cobos, Rockey Situ, MD   75 mg at 02/27/18 4034    Lab Results: No results found for this or any previous visit (from the past 48 hour(s)).  Blood Alcohol level:  No results found for: [facility]  Metabolic Disorder Labs: No results found for: HGBA1C, MPG No results found for: PROLACTIN Lab Results  Component Value Date   CHOL 229 (H) 08/10/2017   TRIG 180.0 (H) 08/10/2017   HDL 40.60 08/10/2017   CHOLHDL 6 08/10/2017   VLDL 36.0 08/10/2017   LDLCALC 152 (H) 08/10/2017   LDLCALC 152 (H) 03/31/2016    Physical Findings: AIMS: Facial and Oral Movements Muscles of Facial Expression: None, normal Lips and Perioral Area: None, normal Jaw: None, normal Tongue: None, normal,Extremity Movements Upper (arms, wrists, hands, fingers): None,  normal Lower (legs, knees, ankles, toes): None, normal, Trunk Movements Neck, shoulders, hips: None, normal, Overall Severity Severity of abnormal movements (highest score from questions above): None, normal Incapacitation due to abnormal movements: None, normal Patient's awareness of abnormal movements (rate only patient's report): No Awareness, Dental Status Current problems with teeth and/or dentures?: No Does patient usually wear dentures?: No  CIWA:  CIWA-Ar Total: 2 COWS:  COWS Total Score: 2  Musculoskeletal: Strength & Muscle Tone: within normal limits Gait & Station: normal Patient leans: N/A  Psychiatric Specialty Exam: Physical Exam  Nursing note and vitals  reviewed. Constitutional: He is oriented to person, place, and time. He appears well-developed and well-nourished.  HENT:  Head: Normocephalic and atraumatic.  Respiratory: Effort normal.  Neurological: He is alert and oriented to person, place, and time.    ROS  Blood pressure 111/89, pulse (!) 105, temperature 98.1 F (36.7 C), temperature source Oral, resp. rate 20, height 5\' 10"  (1.778 m), weight 76.7 kg.Body mass index is 24.25 kg/m.  General Appearance: Casual  Eye Contact:  Minimal  Speech:  Normal Rate  Volume:  Decreased  Mood:  Anxious and Depressed  Affect:  Congruent  Thought Process:  Coherent and Descriptions of Associations: Intact  Orientation:  Full (Time, Place, and Person)  Thought Content:  Rumination  Suicidal Thoughts:  No  Homicidal Thoughts:  No  Memory:  Immediate;   Fair Recent;   Fair Remote;   Fair  Judgement:  Intact  Insight:  Lacking  Psychomotor Activity:  Increased  Concentration:  Concentration: Fair and Attention Span: Fair  Recall:  [employer] of Knowledge:  Fair  Language:  Fair  Akathisia:  Negative  Handed:  Right  AIMS (if indicated):     Assets:  Desire for Improvement Housing Physical Health Resilience Social Support  ADL's:  Intact  Cognition:  WNL   Sleep:  Number of Hours: 4.25     Treatment Plan Summary: Daily contact with patient to assess and evaluate symptoms and progress in treatment, Medication management and Plan : Patient is seen and examined.  Patient is a 62 year old male with the above-stated past psychiatric history who is seen in follow-up.  #1 major depression-he continues to be significantly anxious and depressed.  His Effexor XR was increased day before yesterday, and we will continue this at 75 mg p.o. daily.  I am going to increase his Remeron to 30 mg p.o. nightly for insomnia and depression as well as anxiety.  Hopefully this will reduce some of his sedation complaints that he has.  #2 generalized anxiety disorder-Effexor XR and Remeron as per above.  I am going to increase his BuSpar to 10 mg p.o. twice daily.  He will continue the Ativan 0.5 mg p.o. every 8 hours as needed anxiety.  #3 weight loss-his laboratories in the hospital have all been normal.  The wife stated yesterday that he had had a complete medical work-up before and that it all appeared as though his weight loss was secondary to his depression and anxiety.  No further work-up at this time as an inpatient on the psychiatric service.  #4 disposition planning-I told the patient today that we will plan to discharge him from the hospital in 1 to 2 days as long as he remains nonsuicidal.  Antonieta Pert, MD 02/27/2018, 12:31 PM

## 2018-02-27 NOTE — Progress Notes (Signed)
D: When asked if he had any questions or concerns, pt informed the writer that last night his remeron didn't work, but states the bed is too hard for him to sleep well.  However, pt appears to have some thought blocking, and ruminates on his medications stating, "I don't wanna switch up. I don't want to take too much". Pt has no questions or concerns.    A:  Support and encouragement was offered. 15 min checks continued for safety.  R: Pt remains safe.

## 2018-02-27 NOTE — Progress Notes (Addendum)
Patient ID: BERLE FITZ, male   DOB: [birth year], 62 y.o.   MRN: [id number]   D: Patient was with wife visiting during shift change. Talked with wife and she reported that when she tries to get him to make a decision about something, he gets agitated about it. Patient anxious on approach tonight. Patient very ambivalent about being here and when he will leave. He reports feeling that he may have upset the doctor today and thinks that this may hinder his discharge. Explained that him expressing himself will not hinder his discharge. Patient expresses feeling that he is not going to get discharged. Patient has some ruminating thoughts about if the sleep medication is too strong will it make him use the bathroom on himself. Some medication teaching about the remeron explained and that this would not be a usual side effect. Patient fidgety with his glasses while talking to wife and I tonight. Patient was brighter and smiling after trying to ease him fears about upcoming discharge and medication. He was encourage to come to nurse's station and ask staff for things he needs. He listened and was able to come to nurse's station to ask to shave and for a couple of bandaids for a couple of sores on arm, which was an improvement.  A: Staff will monitor on q 15 minute checks, follow treatment plan and give medications as needed. R: Cooperative on the unit

## 2018-02-27 NOTE — Plan of Care (Signed)
D: Patient presents flat, depressed. His speech is latent, and he reports feeling worried about his finances. He slept fair last night, and received medication that was helpful. His appetite is poor, energy low and concentration poor. He rates his depression and hopelessness 6/10. His anxiety is 5/10. He denies physical complaints or withdrawal symptoms. Patient denies SI/HI/AVH.  A: Patient checked q15 min, and checks reviewed. Reviewed medication changes with patient and educated on side effects. Educated patient on importance of attending group therapy sessions and educated on several coping skills. Encouarged participation in milieu through recreation therapy and attending meals with peers. Support and encouragement provided. Fluids offered. R: Patient receptive to education on medications, and is medication compliant. Patient contracts for safety on the unit. Goal: "Progress" and "go to meetings (groups)."

## 2018-02-27 NOTE — [facility] Group Notes (Signed)
Pt did not attend group this evening. Pt stayed in their room instead.  

## 2018-02-28 MED ORDER — VENLAFAXINE HCL ER 75 MG PO CP24: 75.0000 mg | ORAL_CAPSULE | Freq: Every day | ORAL | 0 refills | Status: DC

## 2018-02-28 MED ORDER — DOXEPIN HCL 10 MG PO CAPS: 10.0000 mg | ORAL_CAPSULE | Freq: Every evening | ORAL | Status: DC | PRN

## 2018-02-28 MED ORDER — HYDROXYZINE HCL 25 MG PO TABS
25.0000 mg | ORAL_TABLET | Freq: Every evening | ORAL | Status: DC | PRN
  Administered 2018-02-28: 25 mg via ORAL
  Filled 2018-02-28 (×7): qty 1

## 2018-02-28 MED ORDER — MIRTAZAPINE 15 MG PO TABS: 15.0000 mg | ORAL_TABLET | Freq: Every day | ORAL | 0 refills | Status: DC

## 2018-02-28 MED ORDER — CLONAZEPAM 0.5 MG PO TABS: 0.5000 mg | ORAL_TABLET | Freq: Two times a day (BID) | ORAL | Status: DC

## 2018-02-28 MED ORDER — MIRTAZAPINE 15 MG PO TABS
15.0000 mg | ORAL_TABLET | Freq: Every day | ORAL | Status: DC
  Administered 2018-02-28: 15 mg via ORAL
  Filled 2018-02-28 (×3): qty 1

## 2018-02-28 MED ORDER — DOXEPIN HCL 10 MG PO CAPS: 10.0000 mg | ORAL_CAPSULE | Freq: Every evening | ORAL | 0 refills | Status: DC | PRN

## 2018-02-28 NOTE — Progress Notes (Signed)
Patient ID: Jonathon Griffin, male   DOB: March 13, 1956, 62 y.o.   MRN: [id number]   D: Patient reports trouble concentrating this morning and feeling like he isn't going to get better. Appears anxious and is fidgety. Blood pressure elevated A: Ativan 0.5mg  given due to anxiety R: Patient not wanting to eat breakfast right now. Given some gatorade and encouraged fluids. Told psych tech to bring him breakfast if he doesn't go to breakfast.

## 2018-02-28 NOTE — Plan of Care (Signed)
  Problem: Education: Goal: Knowledge of [name] Education information/materials will improve Outcome: Progressing   Problem: Education: Goal: Emotional status will improve Outcome: Progressing   Problem: Education: Goal: Mental status will improve Outcome: Progressing   

## 2018-02-28 NOTE — [facility] Group Notes (Signed)
Pt was invited but did not attend orientation/goals group facilitated by MHT Michael L.

## 2018-02-28 NOTE — Progress Notes (Signed)
[facility] MD Progress Note  02/28/2018 11:08 AM MICHAELJOHN BISS  MRN:  [id number] Subjective: Patient is seen and examined.  Patient is a 62 year old male with a past psychiatric history significant for major depression as well as generalized anxiety disorder.  He is seen in follow-up.  We had discussed possible discharge today or tomorrow, but the patient is decided to remain in the hospital.  He complained of multiple side effects to the medications.  We discussed this.  I told him that I would stop the BuSpar, lorazepam as well as reduce the dose of mirtazapine back to the dosage he did been previously.  I also added clonazepam to see if he tolerated this better.  He refused his first dose this morning.  He continues to feel as though he is overmedicated.  We discussed the fact that his medicines have been increased fairly rapidly because of his desire to be discharged from the hospital as soon as he could.  He continues to deny suicidal ideation, but continues to be very anxious, and today when we discussed discharge ruminated on the fact that he would have "so many follow-up appointments". Principal Problem: MDD (major depressive disorder) Diagnosis:   Patient Active Problem List   Diagnosis Date Noted  . MDD (major depressive disorder) [F32.9] 02/24/2018  . Anxiety disorder [F41.9] 02/08/2018  . Abdominal pain [R10.9] 01/16/2018  . Weight loss, non-intentional [R63.4] 01/16/2018  . Well adult exam [Z00.00] 08/10/2017  . Warts [B07.9] 08/10/2017  . Acute bronchitis [J20.9] 07/07/2014  . Cough [R05] 07/07/2014  . Osteoarthritis [M19.90] 07/07/2014  . Polycythemia, secondary [D75.1] 03/06/2014  . Essential hypertension [I10] 03/06/2014  . Insomnia [G47.00] 08/16/2012  . Elevated blood pressure [R03.0] 06/14/2012  . Patient exposure to body fluids [Z77.21] 04/11/2012  . Situational depression [F43.21] 04/11/2012  . Hyperlipidemia [E78.5] 03/19/2009  . Alcohol abuse, in remission [F10.11]  03/19/2009  . Abnormal CBC measurement [R79.89] 03/19/2009  . Elevated AST (SGOT) [R74.0] 03/19/2009  . DYSPNEA [R06.09, R09.89] 02/15/2009  . PULMONARY FUNCTION TESTS, ABNORMAL [R94.2] 02/15/2009   Total Time spent with patient: 30 minutes  Past Psychiatric History: See admission H&P  Past Medical History:  Past Medical History:  Diagnosis Date  . Arthritis   . Hypertension    History reviewed. No pertinent surgical history. Family History:  Family History  Problem Relation Age of Onset  . Arthritis Mother   . Arthritis Sister    Family Psychiatric  History: See admission H&P Social History:  Social History   Substance and Sexual Activity  Alcohol Use No     Social History   Substance and Sexual Activity  Drug Use No    Social History   Socioeconomic History  . Marital status: Married    Spouse name: Not on file  . Number of children: Not on file  . Years of education: Not on file  . Highest education level: Not on file  Occupational History  . Not on file  Social Needs  . Financial resource strain: Not on file  . Food insecurity:    Worry: Not on file    Inability: Not on file  . Transportation needs:    Medical: Not on file    Non-medical: Not on file  Tobacco Use  . Smoking status: Never Smoker  . Smokeless tobacco: Never Used  Substance and Sexual Activity  . Alcohol use: No  . Drug use: No  . Sexual activity: Yes  Lifestyle  . Physical activity:  Days per week: Not on file    Minutes per session: Not on file  . Stress: Not on file  Relationships  . Social connections:    Talks on phone: Not on file    Gets together: Not on file    Attends religious service: Not on file    Active member of club or organization: Not on file    Attends meetings of clubs or organizations: Not on file    Relationship status: Not on file  Other Topics Concern  . Not on file  Social History Narrative  . Not on file   Additional Social History:    Pain  Medications: see MAR Prescriptions: see MAR Over the Counter: see MAR History of alcohol / drug use?: No history of alcohol / drug abuse Longest period of sobriety (when/how long): N/A                    Sleep: Poor  Appetite:  Fair  Current Medications: Current Facility-Administered Medications  Medication Dose Route Frequency Provider Last Rate Last Dose  . alum & mag hydroxide-simeth (MAALOX/MYLANTA) 200-200-20 MG/5ML suspension 30 mL  30 mL Oral Q4H PRN Oneta Rack, NP      . clonazePAM [facility]) tablet 0.5 mg  0.5 mg Oral BID Antonieta Pert, MD      . doxepin [facility]) capsule 10 mg  10 mg Oral QHS PRN Antonieta Pert, MD      . feeding supplement (ENSURE ENLIVE) (ENSURE ENLIVE) liquid 237 mL  237 mL Oral BID BM Cobos, Rockey Situ, MD   237 mL at 02/27/18 1450  . magnesium hydroxide (MILK OF MAGNESIA) suspension 30 mL  30 mL Oral Daily PRN Oneta Rack, NP      . mirtazapine (REMERON) tablet 15 mg  15 mg Oral QHS Antonieta Pert, MD      . venlafaxine XR [facility]) 24 hr capsule 75 mg  75 mg Oral Q breakfast Cobos, Rockey Situ, MD   75 mg at 02/28/18 1610    Lab Results: No results found for this or any previous visit (from the past 48 hour(s)).  Blood Alcohol level:  No results found for: [facility]  Metabolic Disorder Labs: No results found for: HGBA1C, MPG No results found for: PROLACTIN Lab Results  Component Value Date   CHOL 229 (H) 08/10/2017   TRIG 180.0 (H) 08/10/2017   HDL 40.60 08/10/2017   CHOLHDL 6 08/10/2017   VLDL 36.0 08/10/2017   LDLCALC 152 (H) 08/10/2017   LDLCALC 152 (H) 03/31/2016    Physical Findings: AIMS: Facial and Oral Movements Muscles of Facial Expression: None, normal Lips and Perioral Area: None, normal Jaw: None, normal Tongue: None, normal,Extremity Movements Upper (arms, wrists, hands, fingers): None, normal Lower (legs, knees, ankles, toes): None, normal, Trunk Movements Neck, shoulders, hips: None, normal,  Overall Severity Severity of abnormal movements (highest score from questions above): None, normal Incapacitation due to abnormal movements: None, normal Patient's awareness of abnormal movements (rate only patient's report): No Awareness, Dental Status Current problems with teeth and/or dentures?: No Does patient usually wear dentures?: No  CIWA:  CIWA-Ar Total: 0 COWS:  COWS Total Score: 2  Musculoskeletal: Strength & Muscle Tone: within normal limits Gait & Station: normal Patient leans: N/A  Psychiatric Specialty Exam: Physical Exam  Nursing note and vitals reviewed. Constitutional: He is oriented to person, place, and time. He appears well-developed and well-nourished.  HENT:  Head: Normocephalic and atraumatic.  Respiratory:  Effort normal.  Neurological: He is alert and oriented to person, place, and time.    ROS  Blood pressure (!) 124/106, pulse (!) 112, temperature 97.6 F (36.4 C), temperature source Oral, resp. rate 16, height 5\' 10"  (1.778 m), weight 76.7 kg.Body mass index is 24.25 kg/m.  General Appearance: Casual  Eye Contact:  Minimal  Speech:  Normal Rate  Volume:  Decreased  Mood:  Anxious and Depressed  Affect:  Constricted  Thought Process:  Coherent and Descriptions of Associations: Circumstantial  Orientation:  Full (Time, Place, and Person)  Thought Content:  Rumination  Suicidal Thoughts:  No  Homicidal Thoughts:  No  Memory:  Immediate;   Fair Recent;   Fair Remote;   Fair  Judgement:  Intact  Insight:  Lacking  Psychomotor Activity:  Increased  Concentration:  Concentration: Fair and Attention Span: Fair  Recall:  [employer] of Knowledge:  Fair  Language:  Fair  Akathisia:  Negative  Handed:  Right  AIMS (if indicated):     Assets:  Desire for Improvement Financial Resources/Insurance Housing Intimacy Physical Health Resilience Social Support  ADL's:  Intact  Cognition:  WNL  Sleep:  Number of Hours: 2.75     Treatment Plan  Summary: Daily contact with patient to assess and evaluate symptoms and progress in treatment, Medication management and Plan : Patient is seen and examined.  Patient is a 62 year old male with the above-stated past psychiatric history who is seen in follow-up.  #1 major depression/generalized anxiety disorder-he continues to be significantly anxious as well as depressed.  He continues to complain of multiple side effects to his medications.  I have discussed with him stopping the BuSpar, stopping the lorazepam, and reducing his mirtazapine back to 15 mg p.o. nightly.  His Effexor XR will not be changed.  I have added Klonopin 0.5 mg p.o. twice daily for anxiety to see if he tolerates that better than the Ativan.  He has refused that this morning.  We discussed possible discharge today or tomorrow, and he has decided to at least stay until tomorrow.  Hopefully some of the side effects that he is reporting will improve.  I do believe the majority of these are related to his anxiety.  When we discussed discharge he began to ruminate about how many "appointments I have, my wife is going to have to take me and I am not sure she can".  #2 disposition-he remains nonsuicidal, focused on discharge, and at least if his side effect profile decreases over the next 24 hours we will hopefully be able to discharge him tomorrow.  Antonieta Pert, MD 02/28/2018, 11:08 AM

## 2018-02-28 NOTE — Progress Notes (Signed)
Pt has been awake in room the past two hours. Pt is slow to respond verbally. Pt shares feeling like his mind is "toast" "I am done for". Pt reports starting to feelthis way 3 months ago but now becoming more acute. Pt was hoping medicine would help but states it may be making him feel worse. Pt was unaware at how early in the morning it was.

## 2018-02-28 NOTE — [facility] Group Notes (Signed)
Pt did not attend wrap up group this evening. Pt stayed in their room instead 

## 2018-02-28 NOTE — [facility] Group Notes (Signed)
[facility] LCSW Group Therapy Note  Date/Time: 02/28/18, 1315  Type of Therapy/Topic:  Group Therapy:  Balance in Life  Participation Level:  minimal  Description of Group:    This group will address the concept of balance and how it feels and looks when one is unbalanced. Patients will be encouraged to process areas in their lives that are out of balance, and identify reasons for remaining unbalanced. Facilitators will guide patients utilizing problem- solving interventions to address and correct the stressor making their life unbalanced. Understanding and applying boundaries will be explored and addressed for obtaining  and maintaining a balanced life. Patients will be encouraged to explore ways to assertively make their unbalanced needs known to significant others in their lives, using other group members and facilitator for support and feedback.  Therapeutic Goals: 1. Patient will identify two or more emotions or situations they have that consume much of in their lives. 2. Patient will identify signs/triggers that life has become out of balance:  3. Patient will identify two ways to set boundaries in order to achieve balance in their lives:  4. Patient will demonstrate ability to communicate their needs through discussion and/or role plays  Summary of Patient Progress: Pt was quiet during group, did respond to CSW questions, but unable to articulate much.  Continues to voice that he is very anxious about finances.          Therapeutic Modalities:   Cognitive Behavioral Therapy Solution-Focused Therapy Assertiveness Training  Daleen Squ[place], [place]

## 2018-03-01 MED ORDER — HYDROXYZINE HCL 25 MG PO TABS: 25.0000 mg | ORAL_TABLET | Freq: Every evening | ORAL | 0 refills | Status: DC | PRN

## 2018-03-01 MED ORDER — CHLORPROMAZINE HCL 10 MG PO TABS
10.0000 mg | ORAL_TABLET | Freq: Three times a day (TID) | ORAL | Status: DC
  Administered 2018-03-01: 10 mg via ORAL
  Filled 2018-03-01 (×5): qty 1

## 2018-03-01 NOTE — Progress Notes (Signed)
  [facility] Adult Case Management Discharge Plan :  Will you be returning to the same living situation after discharge:  Yes,  patient is returning home with his wife At discharge, do you have transportation home?: Yes,  the patient's wife will pick him up at discharge Do you have the ability to pay for your medications: Yes,  [employer], support from wife, personal income  Release of information consent forms completed and in the chart;  Patient's signature needed at discharge.  Patient to Follow up at: Follow-up Information    [facility] Call.   Why:  Referral for medication management and therapy services made 02/28/18. [facility] will call with your appointment time and date, once your insurance is authorized. Please be sure to call and follow up once you discharge.  Contact information: [address] [place] [address] [phone]           Next level of care provider has access to [facility] Link:yes  Safety Planning and Suicide Prevention discussed: Yes,  with the patient's wife  Have you used any form of tobacco in the last 30 days? (Cigarettes, Smokeless Tobacco, Cigars, and/or Pipes): No  Has patient been referred to the Quitline?: N/A patient is not a smoker  Patient has been referred for addiction treatment: N/A  Maeola Sarah, LCSWA 03/01/2018, 10:25 AM

## 2018-03-01 NOTE — Progress Notes (Signed)
Recreation Therapy Notes  Date: 10.11.19 Time: 0930 Location: [address] Dayroom  Group Topic: Stress Management  Goal Area(s) Addresses:  Patient will verbalize importance of using healthy stress management.  Patient will identify positive emotions associated with healthy stress management.   Intervention: Stress Management  Activity :  Meditation.  LRT introduced patients to the stress management technique of meditation.  LRT played Griffin meditation for patients to engage and follow along.  Education:  Stress Management, Discharge Planning.   Education Outcome: Acknowledges edcuation/In group clarification offered/Needs additional education  Clinical Observations/Feedback: Pt did not attend group.    Jonathon Griffin, LRT/CTRS         Jonathon Griffin 03/01/2018 11:06 AM 

## 2018-03-01 NOTE — Plan of Care (Signed)
Patient was anxious upon approach this morning. Patient was sitting in his bed and stated "I feel paralyzed. I can't get up. I can't do anything." Patient was encouraged to take his medications, and finally took it after about ten minutes of persuading. MD notified.  Patient was compliant with medications prescribed per provider. Safety is maintained with 15 minute changes as well as environmental checks. Will continue to monitor.  Problem: Coping: Goal: Will verbalize feelings Outcome: Not Progressing   Problem: Health Behavior/Discharge Planning: Goal: Ability to make decisions will improve Outcome: Not Progressing    Problem: Self-Concept: Goal: Level of anxiety will decrease Outcome: Not Progressing

## 2018-03-01 NOTE — Discharge Summary (Signed)
Physician Discharge Summary Note  Patient:  Jonathon Griffin is an 62 y.o., male MRN:  [id number] DOB:  05-26-1955 Patient phone:  [phone] (home)  Patient address:   [address] [place] [address],  Total Time spent with patient: 20 minutes  Date of Admission:  02/24/2018 Date of Discharge: 03/01/18  Reason for Admission:  Worsening depression with SI  Principal Problem: MDD (major depressive disorder) Discharge Diagnoses: Patient Active Problem List   Diagnosis Date Noted  . MDD (major depressive disorder) [F32.9] 02/24/2018  . Anxiety disorder [F41.9] 02/08/2018  . Abdominal pain [R10.9] 01/16/2018  . Weight loss, non-intentional [R63.4] 01/16/2018  . Well adult exam [Z00.00] 08/10/2017  . Warts [B07.9] 08/10/2017  . Acute bronchitis [J20.9] 07/07/2014  . Cough [R05] 07/07/2014  . Osteoarthritis [M19.90] 07/07/2014  . Polycythemia, secondary [D75.1] 03/06/2014  . Essential hypertension [I10] 03/06/2014  . Insomnia [G47.00] 08/16/2012  . Elevated blood pressure [R03.0] 06/14/2012  . Patient exposure to body fluids [Z77.21] 04/11/2012  . Situational depression [F43.21] 04/11/2012  . Hyperlipidemia [E78.5] 03/19/2009  . Alcohol abuse, in remission [F10.11] 03/19/2009  . Abnormal CBC measurement [R79.89] 03/19/2009  . Elevated AST (SGOT) [R74.0] 03/19/2009  . DYSPNEA [R06.09, R09.89] 02/15/2009  . PULMONARY FUNCTION TESTS, ABNORMAL [R94.2] 02/15/2009    Past Psychiatric History: Anxiety disorder, MDD  Past Medical History:  Past Medical History:  Diagnosis Date  . Arthritis   . Hypertension    History reviewed. No pertinent surgical history. Family History:  Family History  Problem Relation Age of Onset  . Arthritis Mother   . Arthritis Sister    Family Psychiatric  History: Denies Social History:  Social History   Substance and Sexual Activity  Alcohol Use No     Social History   Substance and Sexual Activity  Drug Use No    Social History    Socioeconomic History  . Marital status: Married    Spouse name: Not on file  . Number of children: Not on file  . Years of education: Not on file  . Highest education level: Not on file  Occupational History  . Not on file  Social Needs  . Financial resource strain: Not on file  . Food insecurity:    Worry: Not on file    Inability: Not on file  . Transportation needs:    Medical: Not on file    Non-medical: Not on file  Tobacco Use  . Smoking status: Never Smoker  . Smokeless tobacco: Never Used  Substance and Sexual Activity  . Alcohol use: No  . Drug use: No  . Sexual activity: Yes  Lifestyle  . Physical activity:    Days per week: Not on file    Minutes per session: Not on file  . Stress: Not on file  Relationships  . Social connections:    Talks on phone: Not on file    Gets together: Not on file    Attends religious service: Not on file    Active member of club or organization: Not on file    Attends meetings of clubs or organizations: Not on file    Relationship status: Not on file  Other Topics Concern  . Not on file  Social History Narrative  . Not on file    Hospital Course:   02/24/18 [facility] MD Assessment: 62 y.o.male. Patient presents to the behavioral health walking with his wife. Patient's wife has concernswith patient is experiencing worsening depression.  Reports multiple stressors states patient was  recently laid off from his job continues to ruminate with finances. States he has been isolative, and pacing the house a most of the day. Reports patient was evaluated by his primary care 4 times with the past 2 weeks. Reports patient was prescribed Celexa at his first visit however reports he has not taken medication as prescribed. Reports subsequent visit was initiated on Wellbutrin and Lexapro. Patient denies taking medication states is not working immediately. Patient denies suicidal or homicidal ideations. Denies previous attempts/plan. Denies  previous inpatient admissions. Reports a family history bipolar:nephew.Patient was encouraged just stay inpatient however has declined at this time. Social work to provide additional outpatient resources.  Patient remained on the [facility] unit for 5 days. The patient stabilized on medication and therapy. Patient was discharged on Effexor-XR 75 mg Daily, Remeron 15 mg QHS, Vistaril 25 mg QHS PRN. Patient has shown improvement with improved mood, affect, sleep, appetite, and interaction. Patient has attended group and participated. Patient has been seen in the day room interacting with peers and staff appropriately. Patient denies any SI/HI/AVH and contracts for safety. Patient agrees to follow up at [facility]. Patient is provided with prescriptions for their medications upon discharge.   Physical Findings: AIMS: Facial and Oral Movements Muscles of Facial Expression: None, normal Lips and Perioral Area: None, normal Jaw: None, normal Tongue: None, normal,Extremity Movements Upper (arms, wrists, hands, fingers): None, normal Lower (legs, knees, ankles, toes): None, normal, Trunk Movements Neck, shoulders, hips: None, normal, Overall Severity Severity of abnormal movements (highest score from questions above): None, normal Incapacitation due to abnormal movements: None, normal Patient's awareness of abnormal movements (rate only patient's report): No Awareness, Dental Status Current problems with teeth and/or dentures?: No Does patient usually wear dentures?: No  CIWA:  CIWA-Ar Total: 0 COWS:  COWS Total Score: 2  Musculoskeletal:  Strength & Muscle Tone: within normal limits Gait & Station: normal Patient leans: N/A  Psychiatric Specialty Exam: Physical Exam  Nursing note and vitals reviewed. Constitutional: He is oriented to person, place, and time. He appears well-developed and well-nourished.  Respiratory: Effort normal.  Musculoskeletal: Normal range of motion.   Neurological: He is alert and oriented to person, place, and time.  Skin: Skin is warm.    Review of Systems  Constitutional: Negative.   HENT: Negative.   Eyes: Negative.   Respiratory: Negative.   Cardiovascular: Negative.   Gastrointestinal: Negative.   Genitourinary: Negative.   Musculoskeletal: Negative.   Skin: Negative.   Neurological: Negative.   Endo/Heme/Allergies: Negative.   Psychiatric/Behavioral: Negative.     Blood pressure (!) 137/96, pulse (!) 106, temperature 97.9 F (36.6 C), resp. rate 16, height 5\' 10"  (1.778 m), weight 76.7 kg.Body mass index is 24.25 kg/m.  General Appearance: Casual  Eye Contact:  Fair  Speech:  Slow  Volume:  Decreased  Mood:  Anxious  Affect:  Congruent  Thought Process:  Coherent and Descriptions of Associations: Intact  Orientation:  Full (Time, Place, and Person)  Thought Content:  Logical  Suicidal Thoughts:  No  Homicidal Thoughts:  No  Memory:  Immediate;   Fair Recent;   Fair Remote;   Fair  Judgement:  Intact  Insight:  Fair  Psychomotor Activity:  Increased  Concentration:  Concentration: Fair  Recall:  [employer] of Knowledge:  Fair  Language:  Fair  Akathisia:  No  Handed:  Right  AIMS (if indicated):     Assets:  Desire for Improvement Housing Physical Health Resilience Social  Support  ADL's:  Intact  Cognition:  WNL  Sleep:  Number of Hours: 6     Have you used any form of tobacco in the last 30 days? (Cigarettes, Smokeless Tobacco, Cigars, and/or Pipes): No  Has this patient used any form of tobacco in the last 30 days? (Cigarettes, Smokeless Tobacco, Cigars, and/or Pipes) Yes, No  Blood Alcohol level:  No results found for: [facility]  Metabolic Disorder Labs:  No results found for: HGBA1C, MPG No results found for: PROLACTIN Lab Results  Component Value Date   CHOL 229 (H) 08/10/2017   TRIG 180.0 (H) 08/10/2017   HDL 40.60 08/10/2017   CHOLHDL 6 08/10/2017   VLDL 36.0 08/10/2017   LDLCALC 152  (H) 08/10/2017   LDLCALC 152 (H) 03/31/2016    See Psychiatric Specialty Exam and Suicide Risk Assessment completed by Attending Physician prior to discharge.  Discharge destination:  Home  Is patient on multiple antipsychotic therapies at discharge:  No   Has Patient had three or more failed trials of antipsychotic monotherapy by history:  No  Recommended Plan for Multiple Antipsychotic Therapies: NA   Allergies as of 03/01/2018      Reactions   Oxycodone-acetaminophen    REACTION: itching   Wellbutrin [bupropion]    tremors      Medication List    STOP taking these medications   Aspirin-Acetaminophen-Caffeine 500-325-65 MG Pack   losartan 100 MG tablet Commonly known as:  COZAAR   OLANZapine-FLUoxetine 3-25 MG capsule Commonly known as:  SYMBYAX   pantoprazole 40 MG tablet Commonly known as:  PROTONIX   traMADol 50 MG tablet Commonly known as:  ULTRAM   Vitamin D3 2000 units capsule   zolpidem 10 MG tablet Commonly known as:  AMBIEN     TAKE these medications     Indication  doxepin 10 MG capsule Commonly known as:  SINEQUAN Take 1 capsule (10 mg total) by mouth at bedtime as needed (insomnia).  Indication:  insomnia   hydrOXYzine 25 MG tablet Commonly known as:  ATARAX/VISTARIL Take 1 tablet (25 mg total) by mouth at bedtime and may repeat dose one time if needed.  Indication:  Feeling Anxious   mirtazapine 15 MG tablet Commonly known as:  REMERON Take 1 tablet (15 mg total) by mouth at bedtime. For mood control  Indication:  mood stability   venlafaxine XR 75 MG 24 hr capsule Commonly known as:  EFFEXOR-XR Take 1 capsule (75 mg total) by mouth daily with breakfast. For mood control  Indication:  mood stability      Follow-up Information    [facility] Call.   Why:  Referral for medication management and therapy services made 02/28/18. [facility] will call with your appointment time and date, once your insurance is  authorized. Please be sure to call and follow up once you discharge.  Contact information: [address] [place] [address] [phone]           Follow-up recommendations:  Continue activity as tolerated. Continue diet as recommended by your PCP. Ensure to keep all appointments with outpatient providers.  Comments:  Patient is instructed prior to discharge to: Take all medications as prescribed by his/her mental healthcare provider. Report any adverse effects and or reactions from the medicines to his/her outpatient provider promptly. Patient has been instructed & cautioned: To not engage in alcohol and or illegal drug use while on prescription medicines. In the event of worsening symptoms, patient is instructed to  call the crisis hotline, 911 and or go to the nearest ED for appropriate evaluation and treatment of symptoms. To follow-up with his/her primary care provider for your other medical issues, concerns and or health care needs.    Signed: Gerlene Burdock Money, FNP 03/01/2018, 10:35 AM

## 2018-03-01 NOTE — [facility] Suicide Risk Assessment (Signed)
[facility] Discharge Suicide Risk Assessment   Principal Problem: MDD (major depressive disorder) Discharge Diagnoses:  Patient Active Problem List   Diagnosis Date Noted  . MDD (major depressive disorder) [F32.9] 02/24/2018  . Anxiety disorder [F41.9] 02/08/2018  . Abdominal pain [R10.9] 01/16/2018  . Weight loss, non-intentional [R63.4] 01/16/2018  . Well adult exam [Z00.00] 08/10/2017  . Warts [B07.9] 08/10/2017  . Acute bronchitis [J20.9] 07/07/2014  . Cough [R05] 07/07/2014  . Osteoarthritis [M19.90] 07/07/2014  . Polycythemia, secondary [D75.1] 03/06/2014  . Essential hypertension [I10] 03/06/2014  . Insomnia [G47.00] 08/16/2012  . Elevated blood pressure [R03.0] 06/14/2012  . Patient exposure to body fluids [Z77.21] 04/11/2012  . Situational depression [F43.21] 04/11/2012  . Hyperlipidemia [E78.5] 03/19/2009  . Alcohol abuse, in remission [F10.11] 03/19/2009  . Abnormal CBC measurement [R79.89] 03/19/2009  . Elevated AST (SGOT) [R74.0] 03/19/2009  . DYSPNEA [R06.09, R09.89] 02/15/2009  . PULMONARY FUNCTION TESTS, ABNORMAL [R94.2] 02/15/2009    Total Time spent with patient: 15 minutes  Musculoskeletal: Strength & Muscle Tone: within normal limits Gait & Station: normal Patient leans: N/A  Psychiatric Specialty Exam: Review of Systems  All other systems reviewed and are negative.   Blood pressure (!) 137/96, pulse (!) 106, temperature 97.9 F (36.6 C), resp. rate 16, height 5\' 10"  (1.778 m), weight 76.7 kg.Body mass index is 24.25 kg/m.  General Appearance: Casual  Eye Contact::  Fair  Speech:  Slow409  Volume:  Decreased  Mood:  Anxious  Affect:  Congruent  Thought Process:  Coherent and Descriptions of Associations: Intact  Orientation:  Full (Time, Place, and Person)  Thought Content:  Logical and Rumination  Suicidal Thoughts:  No  Homicidal Thoughts:  No  Memory:  Immediate;   Fair Recent;   Fair Remote;   Fair  Judgement:  Intact  Insight:  Fair   Psychomotor Activity:  Increased  Concentration:  Fair  Recall:  [employer] of Knowledge:Fair  Language: Fair  Akathisia:  Negative  Handed:  Right  AIMS (if indicated):     Assets:  Desire for Improvement Housing Physical Health Resilience Social Support  Sleep:  Number of Hours: 6  Cognition: WNL  ADL's:  Intact   Mental Status Per Nursing Assessment::   On Admission:  NA  Demographic Factors:  Male, Caucasian, Low socioeconomic status and Unemployed  Loss Factors: NA  Historical Factors: Impulsivity  Risk Reduction Factors:   Living with another person, especially a relative and Positive social support  Continued Clinical Symptoms:  Severe Anxiety and/or Agitation Depression:   Impulsivity  Cognitive Features That Contribute To Risk:  Thought constriction (tunnel vision)    Suicide Risk:  Minimal: No identifiable suicidal ideation.  Patients presenting with no risk factors but with morbid ruminations; may be classified as minimal risk based on the severity of the depressive symptoms  Follow-up Information    [facility] Follow up.   Contact information: [address] [place] [address] [phone]           Plan Of Care/Follow-up recommendations:  Activity:  ad lib  Antonieta Pert, MD 03/01/2018, 10:03 AM

## 2018-03-01 NOTE — Tx Team (Signed)
Interdisciplinary Treatment and Diagnostic Plan Update  03/01/2018 Time of Session:  KEDAR SEDANO MRN: [id number]  Principal Diagnosis: MDD (major depressive disorder)  Secondary Diagnoses: Principal Problem:   MDD (major depressive disorder)   Current Medications:  Current Facility-Administered Medications  Medication Dose Route Frequency Provider Last Rate Last Dose  . alum & mag hydroxide-simeth (MAALOX/MYLANTA) 200-200-20 MG/5ML suspension 30 mL  30 mL Oral Q4H PRN Derrill Center, NP      . feeding supplement (ENSURE ENLIVE) (ENSURE ENLIVE) liquid 237 mL  237 mL Oral BID BM Cobos, Myer Peer, MD   237 mL at 02/27/18 1450  . hydrOXYzine (ATARAX/VISTARIL) tablet 25 mg  25 mg Oral QHS,MR X 1 Lindon Romp A, NP   25 mg at 02/28/18 2237  . magnesium hydroxide (MILK OF MAGNESIA) suspension 30 mL  30 mL Oral Daily PRN Derrill Center, NP      . mirtazapine (REMERON) tablet 15 mg  15 mg Oral QHS Sharma Covert, MD   15 mg at 02/28/18 2136  . venlafaxine XR (EFFEXOR-XR) 24 hr capsule 75 mg  75 mg Oral Q breakfast Cobos, Myer Peer, MD   75 mg at 03/01/18 0807   PTA Medications: Medications Prior to Admission  Medication Sig Dispense Refill Last Dose  . Aspirin-Acetaminophen-Caffeine (GOODYS EXTRA STRENGTH) 500-325-65 MG PACK 1 bid pc 56 each 0 Taking  . Cholecalciferol (VITAMIN D3) 2000 units capsule Take 1 capsule (2,000 Units total) by mouth daily. 100 capsule 3 Taking  . losartan (COZAAR) 100 MG tablet Take 1 tablet (100 mg total) by mouth daily. 90 tablet 3 Taking  . OLANZapine-FLUoxetine (SYMBYAX) 3-25 MG capsule Take 1 capsule by mouth every evening. Take 1 before or with dinner 30 capsule 5   . pantoprazole (PROTONIX) 40 MG tablet Take 1 tablet (40 mg total) by mouth daily. 30 tablet 5 Taking  . traMADol (ULTRAM) 50 MG tablet Take 1 tablet (50 mg total) by mouth every 6 (six) hours as needed. 120 tablet 3 Taking  . zolpidem (AMBIEN) 10 MG tablet TAKE 1 TABLET BY MOUTH AT  BEDTIME AS NEEDED 30 tablet 5 Taking    Patient Stressors: Medication change or noncompliance Occupational concerns  Patient Strengths: Ability for insight Average or above average intelligence General fund of knowledge Supportive family/friends  Treatment Modalities: Medication Management, Group therapy, Case management,  1 to 1 session with clinician, Psychoeducation, Recreational therapy.   Physician Treatment Plan for Primary Diagnosis: MDD (major depressive disorder) Long Term Goal(s): Improvement in symptoms so as ready for discharge Improvement in symptoms so as ready for discharge   Short Term Goals: Ability to identify changes in lifestyle to reduce recurrence of condition will improve Ability to verbalize feelings will improve Ability to maintain clinical measurements within normal limits will improve Compliance with prescribed medications will improve Ability to identify changes in lifestyle to reduce recurrence of condition will improve Ability to verbalize feelings will improve Compliance with prescribed medications will improve Ability to identify triggers associated with substance abuse/mental health issues will improve  Medication Management: Evaluate patient's response, side effects, and tolerance of medication regimen.  Therapeutic Interventions: 1 to 1 sessions, Unit Group sessions and Medication administration.  Evaluation of Outcomes: Adequate for Discharge  Physician Treatment Plan for Secondary Diagnosis: Principal Problem:   MDD (major depressive disorder)  Long Term Goal(s): Improvement in symptoms so as ready for discharge Improvement in symptoms so as ready for discharge   Short Term Goals: Ability to identify changes  in lifestyle to reduce recurrence of condition will improve Ability to verbalize feelings will improve Ability to maintain clinical measurements within normal limits will improve Compliance with prescribed medications will  improve Ability to identify changes in lifestyle to reduce recurrence of condition will improve Ability to verbalize feelings will improve Compliance with prescribed medications will improve Ability to identify triggers associated with substance abuse/mental health issues will improve     Medication Management: Evaluate patient's response, side effects, and tolerance of medication regimen.  Therapeutic Interventions: 1 to 1 sessions, Unit Group sessions and Medication administration.  Evaluation of Outcomes: Adequate for Discharge   RN Treatment Plan for Primary Diagnosis: MDD (major depressive disorder) Long Term Goal(s): Knowledge of disease and therapeutic regimen to maintain health will improve  Short Term Goals: Ability to verbalize feelings will improve, Ability to disclose and discuss suicidal ideas, Ability to identify and develop effective coping behaviors will improve and Compliance with prescribed medications will improve  Medication Management: RN will administer medications as ordered by provider, will assess and evaluate patient's response and provide education to patient for prescribed medication. RN will report any adverse and/or side effects to prescribing provider.  Therapeutic Interventions: 1 on 1 counseling sessions, Psychoeducation, Medication administration, Evaluate responses to treatment, Monitor vital signs and CBGs as ordered, Perform/monitor CIWA, COWS, AIMS and Fall Risk screenings as ordered, Perform wound care treatments as ordered.  Evaluation of Outcomes: Adequate for Discharge   LCSW Treatment Plan for Primary Diagnosis: MDD (major depressive disorder) Long Term Goal(s): Safe transition to appropriate next level of care at discharge, Engage patient in therapeutic group addressing interpersonal concerns.  Short Term Goals: Engage patient in aftercare planning with referrals and resources  Therapeutic Interventions: Assess for all discharge needs, 1 to 1  time with Social worker, Explore available resources and support systems, Assess for adequacy in community support network, Educate family and significant other(s) on suicide prevention, Complete Psychosocial Assessment, Interpersonal group therapy.  Evaluation of Outcomes: Not Met   Progress in Treatment: Attending groups: Yes.  Participating in groups: Yes. Taking medication as prescribed: Yes. Toleration medication: Yes. Family/Significant other contact made: No, will contact:  the patient's wife Patient understands diagnosis: Yes. Discussing patient identified problems/goals with staff: Yes. Medical problems stabilized or resolved: Yes. Denies suicidal/homicidal ideation: Yes. Issues/concerns per patient self-inventory: No. Other:  New problem(s) identified: None   New Short Term/Long Term Goal(s): medication stabilization, elimination of SI thoughts, development of comprehensive mental wellness plan.   Patient Goals:    Discharge Plan or Barriers:  Patient is discharging home with his wife and plans to follow up with [facility] for outpatient medication management and therapy services.   Reason for Continuation of Hospitalization: None  Estimated Length of Stay: Discharging, 03/01/18  Attendees: Patient: 03/01/2018 10:27 AM  Physician: Dr. Neita Garnet, MD 03/01/2018 10:27 AM  Nursing: Rise Paganini.K, RN; Alyssa.B,RN 03/01/2018 10:27 AM  RN Care Manager: Jackelyn Knife, NP 03/01/2018 10:27 AM  Social Worker: Radonna Ricker, [place] 03/01/2018 10:27 AM  Recreational Therapist: Rhunette Croft 03/01/2018 10:27 AM  Other: Ricky Ala, NP 03/01/2018 10:27 AM  Other: Marvia Pickles, NP 03/01/2018 10:27 AM  Other: Rhunette Croft 03/01/2018 10:27 AM    Scribe for Treatment Team: Marylee Floras, [place] 03/01/2018 10:27 AM

## 2018-03-01 NOTE — Progress Notes (Signed)
Discharge note: Patient reviewed discharge paperwork with RN including prescriptions, follow up appointments, and lab work. Patient given the opportunity to ask questions. All concerns were addressed. All belongings were returned to patient. Denied SI/HI/AVH. Patient thanked staff for their care while at the hospital.  Patient was discharged to lobby where his wife was waiting to pick him up. D/c paperwork reviewed with wife as well.

## 2018-03-01 NOTE — Progress Notes (Signed)
D: Patient denies SI, HI or AVH. Patient presents as anxious and guarded with minimal interaction.  Pt. Is hesitant to take his medication but does so with prompting.  Pt. Expresses that he feels very anxious and is preoccupied with the idea that he may have an "accident" while asleep.  Pt. Was provided with a brief, scrub pants and chucks pads to put down in his bed which he did use and was medicated as ordered.    A: Patient given emotional support from RN. Patient encouraged to come to staff with concerns and/or questions. Patient's medication routine continued. Patient's orders and plan of care reviewed.   R: Patient remains appropriate and cooperative. Will continue to monitor patient q15 minutes for safety.

## 2018-03-10 ENCOUNTER — Encounter (HOSPITAL_COMMUNITY): Payer: Self-pay

## 2018-03-10 ENCOUNTER — Emergency Department (HOSPITAL_COMMUNITY)
Admission: EM | Admit: 2018-03-10 | Discharge: 2018-03-11 | Disposition: A | Discharge status: Other Acute Inpatient Hospital | Payer: 59 | Attending: Emergency Medicine | Admitting: Emergency Medicine

## 2018-03-10 ENCOUNTER — Other Ambulatory Visit: Payer: Self-pay

## 2018-03-10 DIAGNOSIS — E876 Hypokalemia: Secondary | ICD-10-CM | POA: Insufficient documentation

## 2018-03-10 DIAGNOSIS — I10 Essential (primary) hypertension: Secondary | ICD-10-CM | POA: Insufficient documentation

## 2018-03-10 DIAGNOSIS — R410 Disorientation, unspecified: Secondary | ICD-10-CM | POA: Diagnosis not present

## 2018-03-10 DIAGNOSIS — R45851 Suicidal ideations: Secondary | ICD-10-CM | POA: Insufficient documentation

## 2018-03-10 DIAGNOSIS — Z046 Encounter for general psychiatric examination, requested by authority: Secondary | ICD-10-CM | POA: Diagnosis not present

## 2018-03-10 DIAGNOSIS — F332 Major depressive disorder, recurrent severe without psychotic features: Secondary | ICD-10-CM | POA: Insufficient documentation

## 2018-03-10 DIAGNOSIS — R45850 Homicidal ideations: Secondary | ICD-10-CM

## 2018-03-10 DIAGNOSIS — Z9114 Patient's other noncompliance with medication regimen: Secondary | ICD-10-CM | POA: Diagnosis not present

## 2018-03-10 DIAGNOSIS — Z79899 Other long term (current) drug therapy: Secondary | ICD-10-CM | POA: Insufficient documentation

## 2018-03-10 DIAGNOSIS — F329 Major depressive disorder, single episode, unspecified: Secondary | ICD-10-CM | POA: Diagnosis present

## 2018-03-10 HISTORY — DX: Sleep apnea, unspecified: G47.30

## 2018-03-10 LAB — COMPREHENSIVE METABOLIC PANEL
ALT: 36 U/L (ref 0–44)
AST: 25 U/L (ref 15–41)
Albumin: 3.5 g/dL (ref 3.5–5.0)
Alkaline Phosphatase: 61 U/L (ref 38–126)
Anion gap: 6 (ref 5–15)
BILIRUBIN TOTAL: 0.8 mg/dL (ref 0.3–1.2)
BUN: 16 mg/dL (ref 6–20)
CHLORIDE: 107 mmol/L (ref 98–111)
CO2: 24 mmol/L (ref 22–32)
Calcium: 9.6 mg/dL (ref 8.9–10.3)
Creatinine, Ser: 0.91 mg/dL (ref 0.61–1.24)
Glucose, Bld: 117 mg/dL — ABNORMAL HIGH (ref 70–99)
POTASSIUM: 3.3 mmol/L — AB (ref 3.5–5.1)
Sodium: 137 mmol/L (ref 135–145)
TOTAL PROTEIN: 7.1 g/dL (ref 6.5–8.1)

## 2018-03-10 LAB — RAPID URINE DRUG SCREEN, HOSP PERFORMED
Amphetamines: NOT DETECTED
BARBITURATES: NOT DETECTED
BENZODIAZEPINES: NOT DETECTED
COCAINE: NOT DETECTED
OPIATES: NOT DETECTED
Tetrahydrocannabinol: NOT DETECTED

## 2018-03-10 LAB — CBC
HEMATOCRIT: 38.7 % — AB (ref 39.0–52.0)
Hemoglobin: 12.8 g/dL — ABNORMAL LOW (ref 13.0–17.0)
MCH: 29.2 pg (ref 26.0–34.0)
MCHC: 33.1 g/dL (ref 30.0–36.0)
MCV: 88.4 fL (ref 80.0–100.0)
Platelets: 277 10*3/uL (ref 150–400)
RBC: 4.38 MIL/uL (ref 4.22–5.81)
RDW: 13 % (ref 11.5–15.5)
WBC: 7.2 10*3/uL (ref 4.0–10.5)
nRBC: 0 % (ref 0.0–0.2)

## 2018-03-10 LAB — ETHANOL

## 2018-03-10 LAB — ACETAMINOPHEN LEVEL

## 2018-03-10 LAB — SALICYLATE LEVEL

## 2018-03-10 MED ORDER — MIRTAZAPINE 15 MG PO TABS
15.0000 mg | ORAL_TABLET | Freq: Every day | ORAL | Status: DC
  Administered 2018-03-10: 15 mg via ORAL
  Filled 2018-03-10: qty 1

## 2018-03-10 MED ORDER — POTASSIUM CHLORIDE CRYS ER 20 MEQ PO TBCR
40.0000 meq | EXTENDED_RELEASE_TABLET | Freq: Two times a day (BID) | ORAL | Status: AC
  Administered 2018-03-10 (×2): 40 meq via ORAL
  Filled 2018-03-10 (×2): qty 2

## 2018-03-10 MED ORDER — VENLAFAXINE HCL ER 37.5 MG PO CP24
75.0000 mg | ORAL_CAPSULE | Freq: Every day | ORAL | Status: DC
  Administered 2018-03-10 – 2018-03-11 (×2): 75 mg via ORAL
  Filled 2018-03-10 (×2): qty 2

## 2018-03-10 MED ORDER — HYDROXYZINE HCL 25 MG PO TABS
25.0000 mg | ORAL_TABLET | Freq: Every evening | ORAL | Status: DC | PRN
  Administered 2018-03-10: 25 mg via ORAL
  Filled 2018-03-10: qty 1

## 2018-03-10 NOTE — Progress Notes (Signed)
CSW faxed completed IVC paperwork to [facility].   Wells Guiles, LCSW, LCAS Disposition CSW [facility] BHH/TTS [phone]

## 2018-03-10 NOTE — ED Notes (Signed)
Wife is aware of pending transfer to [employer] [facility]. Wife brought clothes

## 2018-03-10 NOTE — BH Assessment (Addendum)
Tele Assessment Note   Patient Name: Jonathon Griffin MRN: [id number] Referring Physician: Devoria Albe, MD Location of Patient: [facility], [phone] Location of Provider: Behavioral Health TTS Department  Jonathon Griffin is an 62 y.o. married male who presents unaccompanied to [facility] ED reporting symptoms of depression and anxiety including suicidal ideation and homicidal ideation. Pt has a diagnosis of major depressive disorder and was inpatient at [facility] 10/06-10/11/19. He reports he was feeling better at discharge but he stopped taking his psychiatric medications because he believed they made him feel worse. During assessment Pt appears to have difficulty concentrating, is fidgeting and says "I can't get my thoughts together." Pt reports he feels severely depressed and anxious. Pt acknowledges symptoms including social withdrawal, loss of interest in usual pleasures, fatigue, irritability, decreased concentration, decreased sleep, decreased appetite and feelings of guilt and hopelessness. He says he is sleeping 1-2 hours per night and eating very little. He reports current suicidal ideation with no specific plan. He told EDP when asked if he thought he would do something to harm himself "I figure of the prisoners would take care of me". Pt reports thoughts of killing his wife including stabbing her with a knife. He denies a history of aggression or violence. Pt denies auditory or visual hallucinations. He denies alcohol or substance use. He denies taking any overdose of medication.  Pt states he confessed to his wife he has had several affairs where his wife was aware of only one. He also reports he told her he had hidden cameras in the bathroom in their home for the purpose of recording his wife "but other people were recorded too." Pt denies any other stressors. Since discharge he has refused to take Effexor and is only been taking the Vistaril at bedtime. He says he took his first dose of the  mirtazapine today. Pt denies any legal issues. Pt reports he has antique guns in the home that will not fire.  Pt is dressed in hospital scrubs, alert and oriented x4. Pt speaks in a soft tone, at low volume and slow pace. Motor behavior appears fidgety with Pt rubbing his eyes, forehead, arms and legs. Eye contact is fair. Pt's mood is depressed and anxious; affect is congruent with mood. Thought process is coherent but Pt's concentration appears impaired. Pt was cooperative throughout assessment and says he is willing to sign voluntarily into a psychiatric facility.  Excerpt from Discharged Summary by Reola Calkins, FNP on 03/01/18:  [facility] MD Assessment: 62 y.o. male. Patient presents to the behavioral health walking with his wife.  Patient's wife has concerns with patient is experiencing worsening depression.  Reports multiple stressors states patient was recently laid off from his job continues to ruminate with finances.  States he has been isolative, and pacing the house a most of the day.  Reports patient was evaluated by his primary care 4 times with the past 2 weeks.  Reports patient was prescribed Celexa at his first visit however reports he has not taken medication as prescribed.  Reports subsequent visit was initiated on Wellbutrin and Lexapro.  Patient denies taking medication states is not working immediately.  Patient denies suicidal or homicidal ideations.  Denies previous attempts/plan.  Denies previous inpatient admissions.  Reports a family history bipolar: nephew. Patient was encouraged just stay inpatient however has declined at this time.  Social work to provide additional outpatient resources.   Patient remained on the [facility] unit for 5 days. The patient stabilized  on medication and therapy. Patient was discharged on Effexor-XR 75 mg Daily, Remeron 15 mg QHS, Vistaril 25 mg QHS PRN. Patient has shown improvement with improved mood, affect, sleep, appetite, and interaction. Patient has  attended group and participated. Patient has been seen in the day room interacting with peers and staff appropriately. Patient denies any SI/HI/AVH and contracts for safety. Patient agrees to follow up at [facility]   Diagnosis: F33.2 Major depressive disorder, Recurrent episode, Severe  Past Medical History:  Past Medical History:  Diagnosis Date  . Arthritis   . Hypertension   . Sleep apnea     Past Surgical History:  Procedure Laterality Date  . bicep surgery      Family History:  Family History  Problem Relation Age of Onset  . Arthritis Mother   . Arthritis Sister     Social History:  reports that he has never smoked. He has never used smokeless tobacco. He reports that he does not drink alcohol or use drugs.  Additional Social History:  Alcohol / Drug Use Pain Medications: see MAR Prescriptions: see MAR Over the Counter: see MAR History of alcohol / drug use?: No history of alcohol / drug abuse Longest period of sobriety (when/how long): N/A  CIWA: CIWA-Ar BP: (!) 133/96 Pulse Rate: 77 COWS:    Allergies:  Allergies  Allergen Reactions  . Oxycodone-Acetaminophen     REACTION: itching  . Wellbutrin [Bupropion]     tremors    Home Medications:  (Not in a hospital admission)  OB/GYN Status:  No LMP for male patient.  General Assessment Data Location of Assessment: [facility] TTS Assessment: In system Is this a Tele or Face-to-Face Assessment?: Tele Assessment Is this an Initial Assessment or a Re-assessment for this encounter?: Initial Assessment Patient Accompanied by:: Other(Wife) Language Other than English: No Living Arrangements: Other (Comment)(Lives with wife) What gender do you identify as?: Male Marital status: Married Jordan name: NA Pregnancy Status: No Living Arrangements: Spouse/significant other Can pt return to current living arrangement?: Yes Admission Status: Voluntary Is patient capable of signing voluntary admission?:  Yes Referral Source: Self/Family/Friend Insurance type: Community education officer     Crisis Care Plan Living Arrangements: Spouse/significant other Legal Guardian: Other:(Self) Name of Psychiatrist: none Name of Therapist: none  Education Status Is patient currently in school?: No Highest grade of school patient has completed: Some college Is the patient employed, unemployed or receiving disability?: Unemployed  Risk to self with the past 6 months Suicidal Ideation: Yes-Currently Present Has patient been a risk to self within the past 6 months prior to admission? : Yes Suicidal Intent: No Has patient had any suicidal intent within the past 6 months prior to admission? : No Is patient at risk for suicide?: Yes Suicidal Plan?: No Has patient had any suicidal plan within the past 6 months prior to admission? : No Access to Means: No What has been your use of drugs/alcohol within the last 12 months?: Pt denies Previous Attempts/Gestures: No How many times?: 0 Other Self Harm Risks: None Triggers for Past Attempts: None known Intentional Self Injurious Behavior: None Family Suicide History: No Recent stressful life event(s): Conflict (Comment)(Conflict with wife) Persecutory voices/beliefs?: No Depression: Yes Depression Symptoms: Despondent, Insomnia, Isolating, Fatigue, Guilt, Loss of interest in usual pleasures, Feeling worthless/self pity, Feeling angry/irritable Substance abuse history and/or treatment for substance abuse?: No Suicide prevention information given to non-admitted patients: Not applicable  Risk to Others within the past 6 months Homicidal Ideation: Yes-Currently Present Does patient  have any lifetime risk of violence toward others beyond the six months prior to admission? : No Thoughts of Harm to Others: Yes-Currently Present Comment - Thoughts of Harm to Others: Thoughts of killing wife Current Homicidal Intent: No Current Homicidal Plan: Yes-Currently Present Describe  Current Homicidal Plan: Stabbing her with a knife Access to Homicidal Means: Yes Describe Access to Homicidal Means: Access to knives at home Identified Victim: Wife History of harm to others?: No Assessment of Violence: None Noted Violent Behavior Description: Pt denies history of violence Does patient have access to weapons?: No Criminal Charges Pending?: No Does patient have a court date: No Is patient on probation?: No  Psychosis Hallucinations: None noted Delusions: None noted  Mental Status Report Appearance/Hygiene: In scrubs Eye Contact: Fair Motor Activity: Other (Comment)(Fidgeting) Speech: Soft, Slow Level of Consciousness: Alert Mood: Anxious, Depressed Affect: Anxious, Depressed Anxiety Level: Panic Attacks Panic attack frequency: Daily Most recent panic attack: Today Thought Processes: Coherent Judgement: Impaired Orientation: Person, Place, Time, Situation Obsessive Compulsive Thoughts/Behaviors: None  Cognitive Functioning Concentration: Decreased Memory: Recent Intact, Remote Intact Is patient IDD: No Insight: Fair Impulse Control: Fair Appetite: Poor Have you had any weight changes? : Loss Amount of the weight change? (lbs): 30 lbs Sleep: Decreased Total Hours of Sleep: 2 Vegetative Symptoms: None  ADLScreening [facility] Assessment Services) Patient's cognitive ability adequate to safely complete daily activities?: Yes Patient able to express need for assistance with ADLs?: Yes Independently performs ADLs?: Yes (appropriate for developmental age)  Prior Inpatient Therapy Prior Inpatient Therapy: Yes Prior Therapy Dates: 10/06-10/11/19 Prior Therapy Facilty/Provider(s): [facility] Reason for Treatment: MDD  Prior Outpatient Therapy Prior Outpatient Therapy: No Does patient have an ACCT team?: No Does patient have Intensive In-House Services?  : No Does patient have [facility] services? : No Does patient have P4CC services?: No  ADL Screening  (condition at time of admission) Patient's cognitive ability adequate to safely complete daily activities?: Yes Is the patient deaf or have difficulty hearing?: No Does the patient have difficulty seeing, even when wearing glasses/contacts?: No Does the patient have difficulty concentrating, remembering, or making decisions?: Yes Patient able to express need for assistance with ADLs?: Yes Does the patient have difficulty dressing or bathing?: No Independently performs ADLs?: Yes (appropriate for developmental age) Does the patient have difficulty walking or climbing stairs?: No Weakness of Legs: None Weakness of Arms/Hands: None  Home Assistive Devices/Equipment Home Assistive Devices/Equipment: Eyeglasses    Abuse/Neglect Assessment (Assessment to be complete while patient is alone) Abuse/Neglect Assessment Can Be Completed: Yes Physical Abuse: Denies Verbal Abuse: Denies Sexual Abuse: Denies Exploitation of patient/patient's resources: Denies Self-Neglect: Denies     Merchant navy officer (For Healthcare) Does Patient Have a Medical Advance Directive?: No Would patient like information on creating a medical advance directive?: No - Patient declined          Disposition: Rosey Bath, [facility], confirmed adult unit is currently at capacity. Gave clinical report to Donell Sievert, PA who said Pt meets criteria for inpatient psychiatric treatment. TTS will contact other facilities for placement. Notified Dr. Devoria Albe and [facility] staff of recommendation.  Disposition Initial Assessment Completed for this Encounter: Yes  This service was provided via telemedicine using a 2-way, interactive audio and video technology.  Names of all persons participating in this telemedicine service and their role in this encounter. Name: [name] Role: Patient  Name: Shela Commons. LPC Role: TTS counselor  Harlin Rain Patsy Baltimore, LPC, [facility], [facility] Triage Specialist [phone]  Patsy Baltimore, Harlin Rain 03/10/2018 6:45 AM

## 2018-03-10 NOTE — Progress Notes (Addendum)
CSW spoke with pt's wife, [name] [phone]), and obtained collateral information. She reports that pt's symptoms of depression/anxiety have worsened since he lost his job in June 2019. He received inpatient treatment at [facility] earlier this month and has attended one outpatient group session since then, but has reportedly been refusing to take his daytime medication (Effexor). Pt's wife is concerned that he has been accepted to [facility] because of the distance from their home in [place]. She was tearful when explaining, "We've all each other have got...our children are all grown and have moved away. I work full-time and will not be able to drive to [place] to see him everyday. I'm so afraid he will be scared". Mrs. Balistreri voiced her wish for pt to stay in a hospital closer to [facility]. CSW will coordinate with [facility] NP and [facility] about options and contact Mrs. Dolezal back.  Wells Guiles, LCSW, LCAS Disposition CSW [facility] BHH/TTS [phone]  Update 3:14pm: CSW left a voice message for Mrs. Dangerfield explaining that no changes in pt's disposition have been made at this time, but that CSW would continue to contact other hospitals to check bed availability.

## 2018-03-10 NOTE — ED Notes (Signed)
Pt has been wanded by security and is in purple scrubs at this time.

## 2018-03-10 NOTE — ED Notes (Signed)
Pts wife is visiting at this time

## 2018-03-10 NOTE — ED Provider Notes (Signed)
[facility] EMERGENCY DEPARTMENT Provider Note   CSN: [id number] Arrival date & time: 03/10/18  0342  Time seen 04:05 AM   History   Chief Complaint Chief Complaint  Patient presents with  . V70.1    HPI Jonathon WEISSINGER is a 62 y.o. male.  HPI patient was admitted to behavioral health from October 6 through the 11 for depression.  He was started on Effexor and states he was feeling better when he was discharged.  However since he was discharged he has refused to take the Effexor.  He is only been taking the Vistaril at bedtime and he took his first dose of the mirtazapine today.  His wife reports to the nurse that they had a good day however by 11:00 things change.  He confessed to having several affairs that his wife was only aware of one before.  He then felt like he wanted to kill his wife.  When asked what he would do he states "I would stab her or something".  I asked him if he thought he would do something to harm himself and he states "I figure of the prisoners would take care of me".  He states he is never tried to harm his wife in the past.  He also denies doing any self-harm to himself.  PCP Plotnikov, Georgina Quint, MD   Past Medical History:  Diagnosis Date  . Arthritis   . Hypertension   . Sleep apnea     Patient Active Problem List   Diagnosis Date Noted  . MDD (major depressive disorder) 02/24/2018  . Anxiety disorder 02/08/2018  . Abdominal pain 01/16/2018  . Weight loss, non-intentional 01/16/2018  . Well adult exam 08/10/2017  . Warts 08/10/2017  . Acute bronchitis 07/07/2014  . Cough 07/07/2014  . Osteoarthritis 07/07/2014  . Polycythemia, secondary 03/06/2014  . Essential hypertension 03/06/2014  . Insomnia 08/16/2012  . Elevated blood pressure 06/14/2012  . Patient exposure to body fluids 04/11/2012  . Situational depression 04/11/2012  . Hyperlipidemia 03/19/2009  . Alcohol abuse, in remission 03/19/2009  . Abnormal CBC measurement 03/19/2009  .  Elevated AST (SGOT) 03/19/2009  . DYSPNEA 02/15/2009  . PULMONARY FUNCTION TESTS, ABNORMAL 02/15/2009    Past Surgical History:  Procedure Laterality Date  . bicep surgery          Home Medications    Prior to Admission medications   Medication Sig Start Date End Date Taking? Authorizing Provider  hydrOXYzine (ATARAX/VISTARIL) 25 MG tablet Take 1 tablet (25 mg total) by mouth at bedtime and may repeat dose one time if needed. 03/01/18  Yes Money, Gerlene Burdock, FNP  mirtazapine (REMERON) 15 MG tablet Take 1 tablet (15 mg total) by mouth at bedtime. For mood control 02/28/18  Yes Money, Gerlene Burdock, FNP  doxepin (SINEQUAN) 10 MG capsule Take 1 capsule (10 mg total) by mouth at bedtime as needed (insomnia). Patient not taking: Reported on 03/10/2018 02/28/18   Money, Gerlene Burdock, FNP  venlafaxine XR (EFFEXOR-XR) 75 MG 24 hr capsule Take 1 capsule (75 mg total) by mouth daily with breakfast. For mood control Patient not taking: Reported on 03/10/2018 02/28/18   Money, Gerlene Burdock, FNP    Family History Family History  Problem Relation Age of Onset  . Arthritis Mother   . Arthritis Sister     Social History Social History   Tobacco Use  . Smoking status: Never Smoker  . Smokeless tobacco: Never Used  Substance Use Topics  . Alcohol use:  No  . Drug use: No  live at home Lives with spouse   Allergies   Oxycodone-acetaminophen and Wellbutrin [bupropion]   Review of Systems Review of Systems  All other systems reviewed and are negative.    Physical Exam Updated Vital Signs BP (!) 133/96 (BP Location: Right Arm)   Pulse 77   Temp 97.8 F (36.6 C) (Oral)   Resp 20   Ht 5\' 10"  (1.778 m)   Wt 82.5 kg   SpO2 100%   BMI 26.10 kg/m   Physical Exam  Constitutional: He is oriented to person, place, and time. He appears well-developed and well-nourished.  Non-toxic appearance. He does not appear ill. No distress.  HENT:  Head: Normocephalic and atraumatic.  Right Ear: External  ear normal.  Left Ear: External ear normal.  Nose: Nose normal. No mucosal edema or rhinorrhea.  Mouth/Throat: Oropharynx is clear and moist and mucous membranes are normal. No dental abscesses or uvula swelling.  Eyes: Pupils are equal, round, and reactive to light. Conjunctivae and EOM are normal.  Neck: Normal range of motion and full passive range of motion without pain. Neck supple.  Cardiovascular: Normal rate, regular rhythm and normal heart sounds. Exam reveals no gallop and no friction rub.  No murmur heard. Pulmonary/Chest: Effort normal and breath sounds normal. No respiratory distress. He has no wheezes. He has no rhonchi. He has no rales. He exhibits no tenderness and no crepitus.  Abdominal: Soft. Normal appearance and bowel sounds are normal. He exhibits no distension. There is no tenderness. There is no rebound and no guarding.  Musculoskeletal: Normal range of motion. He exhibits no edema or tenderness.  Moves all extremities well.   Neurological: He is alert and oriented to person, place, and time. He has normal strength. No cranial nerve deficit.  Skin: Skin is warm, dry and intact. No rash noted. No erythema. No pallor.  Psychiatric: He has a normal mood and affect. His speech is delayed. He is withdrawn. He expresses homicidal and suicidal ideation.  Patient is constantly picking at his close.  Nursing note and vitals reviewed.    ED Treatments / Results  Labs (all labs ordered are listed, but only abnormal results are displayed) Results for orders placed or performed during the hospital encounter of 03/10/18  Comprehensive metabolic panel  Result Value Ref Range   Sodium 137 135 - 145 mmol/L   Potassium 3.3 (L) 3.5 - 5.1 mmol/L   Chloride 107 98 - 111 mmol/L   CO2 24 22 - 32 mmol/L   Glucose, Bld 117 (H) 70 - 99 mg/dL   BUN 16 6 - 20 mg/dL   Creatinine, Ser 1.61 0.61 - 1.24 mg/dL   Calcium 9.6 8.9 - 09.6 mg/dL   Total Protein 7.1 6.5 - 8.1 g/dL   Albumin 3.5  3.5 - 5.0 g/dL   AST 25 15 - 41 U/L   ALT 36 0 - 44 U/L   Alkaline Phosphatase 61 38 - 126 U/L   Total Bilirubin 0.8 0.3 - 1.2 mg/dL   GFR calc non Af Amer >60 >60 mL/min   GFR calc Af Amer >60 >60 mL/min   Anion gap 6 5 - 15  Ethanol  Result Value Ref Range   Alcohol, Ethyl (B) <10 <10 mg/dL  Salicylate level  Result Value Ref Range   Salicylate Lvl <7.0 2.8 - 30.0 mg/dL  Acetaminophen level  Result Value Ref Range   Acetaminophen (Tylenol), Serum <10 (L) 10 - 30  ug/mL  cbc  Result Value Ref Range   WBC 7.2 4.0 - 10.5 K/uL   RBC 4.38 4.22 - 5.81 MIL/uL   Hemoglobin 12.8 (L) 13.0 - 17.0 g/dL   HCT 16.1 (L) 09.6 - 04.5 %   MCV 88.4 80.0 - 100.0 fL   MCH 29.2 26.0 - 34.0 pg   MCHC 33.1 30.0 - 36.0 g/dL   RDW 40.9 81.1 - 91.4 %   Platelets 277 150 - 400 K/uL   nRBC 0.0 0.0 - 0.2 %   Laboratory interpretation all normal except mild hypokalemia, mild anemia    EKG None  Radiology No results found.  Procedures Procedures (including critical care time)  Medications Ordered in ED Medications  potassium chloride SA (K-DUR,KLOR-CON) CR tablet 40 mEq (has no administration in time range)  venlafaxine XR (EFFEXOR-XR) 24 hr capsule 75 mg (has no administration in time range)  mirtazapine (REMERON) tablet 15 mg (has no administration in time range)  hydrOXYzine (ATARAX/VISTARIL) tablet 25 mg (has no administration in time range)     Initial Impression / Assessment and Plan / ED Course  I have reviewed the triage vital signs and the nursing notes.  Pertinent labs & imaging results that were available during my care of the patient were reviewed by me and considered in my medical decision making (see chart for details).     Medical clearance testing was done.  I discussed with patient whether he is going to voluntarily agreed to treatment or if I will need to do commitment papers on him he states he will agree for admission.  Patient had a mild hypokalemia, he was given  oral potassium, 2 doses showed improvement to the normal range.  6:06 AM patient's labs have resulted, however he is not given [place] a urine yet for UDS.  Psych holding orders were written including his home medications that he is supposed to be taking, and TTS consult was ordered.  6:35 AM Ford, TTS, has assessed patient and agrees that patient needs to be admitted to psychiatric facility.  He states currently they do not have any beds.  He states they may have a bed later today.  He will look for other hospitals for admission for now.  He states patient is agreeable to signing himself in for treatment.  Final Clinical Impressions(s) / ED Diagnoses   Final diagnoses:  Severe episode of recurrent major depressive disorder, without psychotic features (HCC)  Homicidal ideation  Suicidal ideation    Plan inpatient psychiatric admission  Devoria Albe, MD, Concha Pyo, MD 03/10/18 [phone]

## 2018-03-10 NOTE — Progress Notes (Signed)
Pt meets inpatient treatment per Donell Sievert, PA. Referral information has been sent to the following hospitals for review:   [facility]  [facility]  [facility]  [facility]  [facility]  [facility]  [facility]  [facility]    Disposition will continue to assist with placement needs.   Wells Guiles, LCSW, LCAS Disposition CSW [facility]/TTS [phone]

## 2018-03-10 NOTE — ED Notes (Signed)
Pt has been accepted at [facility] . Accepting Dr Loyola Mast. Report number [phone]. Pt needs to be IVC due to inability to sign voluntary papers due to confusion. IVC papers given to Dr Clarene Duke. Pt cannot arrive at [facility] until [facility] Monday 03/11/18

## 2018-03-10 NOTE — ED Notes (Signed)
Pts wife is here to see pt. Unsure what is going on with pt. Asked pt what information I could share with her. Pt can only talk about his urine specimen not being sealed and having diarrhea. Wife updated on visitation hours and called [facility] and given her number for [facility] to call

## 2018-03-10 NOTE — ED Triage Notes (Signed)
Pt reports suddenly having feelings of harming himself and his wife that started a couple of hours ago. Pt was recently discharged from [facility] with Rx for Effexor, Mirtazapine, and hydroxyzine. Spouse and pt report pt has taken one dose of Mirtazapine today and hydroxyzine at bedtime since discharged, but has not been taking Effexor at all- pt says it makes him "foggy-headed".

## 2018-03-10 NOTE — ED Notes (Signed)
Pt ambulated to [facility] by sitter. Conts to talk about collection of urine even after being informed multiple times that urine was not needed

## 2018-03-10 NOTE — Progress Notes (Addendum)
Pt accepted to [facility]. Dr. Estill Cotta is the accepting/attending    Call report to [phone] Robin @ [facility] ED notified. She will talk to the EDP about IVC appropriateness.   If pt is involuntary committed, he will be transported by Patent examiner.  Pt is scheduled to arrive at [facility] at [facility] on 03/11/18  Completed IVC paperwork should be faxed to [facility] @ [phone] before pt is transported  Bethanne Ginger Disposition CSW [facility]/TTS [phone]

## 2018-03-10 NOTE — ED Notes (Signed)
Pt given lunch tray.

## 2018-03-11 NOTE — Progress Notes (Signed)
Patient's wife, Clara, contacted TTS this morning to see if there were any further updates on her husband's transfer to [facility] at this time. TTS informed her that according to the notes there is no further information at this time but that as soon as there is an update she will be contacted. She requested being called back at her cell phone [phone].

## 2018-03-11 NOTE — ED Notes (Signed)
Called for transport of Pt to [facility].

## 2018-03-11 NOTE — ED Notes (Signed)
Patent home medication given to [facility] [place] to turn into pharmacy.

## 2018-03-11 NOTE — ED Notes (Signed)
IVC papers faxed to [facility] earlier.

## 2018-08-16 ENCOUNTER — Telehealth: Payer: Self-pay | Admitting: Internal Medicine

## 2018-08-16 NOTE — Telephone Encounter (Signed)
Copied from CRM [id number]. Topic: Quick Communication - Rx Refill/Question >> Aug 16, 2018 10:38 AM Baldo Daub L wrote: Medication: Tramadol  Pt called and left message on [facility] mailbox on 08/15/2018.  Pt requested refill of Tramadol and states he can be reached at [phone]

## 2018-08-16 NOTE — Telephone Encounter (Signed)
Please advise. ED discontinued Tramadol

## 2018-08-18 NOTE — Telephone Encounter (Signed)
I do not see any ER notes since October in the chart.  Please schedule an office visit for next week.  Thank you

## 2018-08-19 NOTE — Telephone Encounter (Signed)
Please schedule pt a virtual/OV for patient to get refill. Thank you

## 2018-08-20 ENCOUNTER — Ambulatory Visit (INDEPENDENT_AMBULATORY_CARE_PROVIDER_SITE_OTHER): Payer: 59 | Admitting: Internal Medicine

## 2018-08-20 ENCOUNTER — Encounter: Payer: Self-pay | Admitting: Internal Medicine

## 2018-08-20 DIAGNOSIS — R634 Abnormal weight loss: Secondary | ICD-10-CM | POA: Diagnosis not present

## 2018-08-20 DIAGNOSIS — F322 Major depressive disorder, single episode, severe without psychotic features: Secondary | ICD-10-CM | POA: Diagnosis not present

## 2018-08-20 DIAGNOSIS — M180 Bilateral primary osteoarthritis of first carpometacarpal joints: Secondary | ICD-10-CM | POA: Diagnosis not present

## 2018-08-20 DIAGNOSIS — F413 Other mixed anxiety disorders: Secondary | ICD-10-CM

## 2018-08-20 MED ORDER — TRAMADOL HCL 50 MG PO TABS: 50.0000 mg | ORAL_TABLET | Freq: Four times a day (QID) | ORAL | 1 refills | Status: DC | PRN

## 2018-08-20 MED ORDER — TRAMADOL HCL 50 MG PO TABS: 50.0000 mg | ORAL_TABLET | Freq: Three times a day (TID) | ORAL | 2 refills | Status: DC | PRN

## 2018-08-20 NOTE — Assessment & Plan Note (Signed)
I spoke with Jonathon Griffin about the bout of paranoid depression that he had in October 2019.  He was briefly hospitalized for inpatient treatments.  She states he is totally back to normal.  He is not suicidal or homicidal.  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  However, he has a lot of arthritis in his hands and shoulders and he needs to go back on tramadol to be able to use his hands at work. In respect to his weight loss-she has gained it back and gained an extra 5 pounds on top of it.  His appetite is good. He is seeing Rosemarie Beath with the [facility] in [place]. Currently on Lamictal

## 2018-08-20 NOTE — Progress Notes (Signed)
Virtual Visit via Telephone Note  I connected with Jonathon Griffin on 08/20/18 at 10:40 AM EDT by telephone and verified that I am speaking with the correct person using two identifiers.   I discussed the limitations, risks, security and privacy concerns of performing an evaluation and management service by telephone and the availability of in person appointments. I also discussed with the patient that there may be a patient responsible charge related to this service. The patient expressed understanding and agreed to proceed.   History of Present Illness:   I spoke with Jonathon Griffin about the bout of paranoid depression that he had in October 2019.  He was briefly hospitalized for inpatient treatments.  She states he is totally back to normal.  He is not suicidal or homicidal.  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  However, he has a lot of arthritis in his hands and shoulders and he needs to go back on tramadol to be able to use his hands at work. In respect to his weight loss-she has gained it back and gained an extra 5 pounds on top of it.  His appetite is good. He is seeing Rosemarie Beath with the psychology [facility] mood center in [place]. Observations/Objective:  Jonathon Griffin looks well.  She is well-nourished.  He is alert oriented and cooperative Assessment and Plan: See plan  Follow Up Instructions:    I discussed the assessment and treatment plan with the patient. The patient was provided an opportunity to ask questions and all were answered. The patient agreed with the plan and demonstrated an understanding of the instructions.   The patient was advised to call back or seek an in-person evaluation if the symptoms worsen or if the condition fails to improve as anticipated.  I provided 16 minutes of non-face-to-face time during this encounter.   Sonda Primes, MD

## 2018-08-20 NOTE — Telephone Encounter (Signed)
Left message for patient to give a call back to schedule V visit.  Please transfer patient to office to get set up.

## 2018-08-20 NOTE — Telephone Encounter (Signed)
Appointment scheduled for virtual visit today 

## 2018-08-20 NOTE — Assessment & Plan Note (Signed)
Resolved

## 2018-08-20 NOTE — Assessment & Plan Note (Signed)
Tramadol prn renewed  Potential benefits of a long term opioids use as well as potential risks (i.e. addiction risk, apnea etc) and complications (i.e. Somnolence, constipation and others) were explained to the patient and were aknowledged.

## 2018-08-20 NOTE — Assessment & Plan Note (Signed)
I spoke with Jonathon Griffin about the bout of paranoid depression that he had in October 2019.  He was briefly hospitalized for inpatient treatments.  She states he is totally back to normal.  He is not suicidal or homicidal.  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  However, he has a lot of arthritis in his hands and shoulders and he needs to go back on tramadol to be able to use his hands at work. In respect to his weight loss-she has gained it back and gained an extra 5 pounds on top of it.  His appetite is good. He is seeing Michelle Sadler with the psychology [facility] mood center in [place]. Currently on Lamictal 

## 2018-12-06 ENCOUNTER — Other Ambulatory Visit: Payer: Self-pay

## 2018-12-10 MED ORDER — TRAMADOL HCL 50 MG PO TABS: 50.0000 mg | ORAL_TABLET | Freq: Three times a day (TID) | ORAL | 0 refills | Status: DC | PRN

## 2019-02-12 ENCOUNTER — Telehealth: Payer: Self-pay | Admitting: Internal Medicine

## 2019-02-12 ENCOUNTER — Other Ambulatory Visit: Payer: Self-pay

## 2019-02-12 ENCOUNTER — Ambulatory Visit (INDEPENDENT_AMBULATORY_CARE_PROVIDER_SITE_OTHER): Payer: 59 | Admitting: Internal Medicine

## 2019-02-12 ENCOUNTER — Encounter: Payer: Self-pay | Admitting: Internal Medicine

## 2019-02-12 ENCOUNTER — Other Ambulatory Visit (INDEPENDENT_AMBULATORY_CARE_PROVIDER_SITE_OTHER): Payer: 59

## 2019-02-12 VITALS — BP 126/80 | HR 64 | Temp 98.3°F | Ht 70.0 in | Wt 206.0 lb

## 2019-02-12 DIAGNOSIS — Z125 Encounter for screening for malignant neoplasm of prostate: Secondary | ICD-10-CM

## 2019-02-12 DIAGNOSIS — Z0000 Encounter for general adult medical examination without abnormal findings: Secondary | ICD-10-CM

## 2019-02-12 DIAGNOSIS — I10 Essential (primary) hypertension: Secondary | ICD-10-CM

## 2019-02-12 DIAGNOSIS — F322 Major depressive disorder, single episode, severe without psychotic features: Secondary | ICD-10-CM

## 2019-02-12 DIAGNOSIS — F413 Other mixed anxiety disorders: Secondary | ICD-10-CM | POA: Diagnosis not present

## 2019-02-12 DIAGNOSIS — D751 Secondary polycythemia: Secondary | ICD-10-CM | POA: Diagnosis not present

## 2019-02-12 LAB — CBC WITH DIFFERENTIAL/PLATELET
Basophils Absolute: 0.1 10*3/uL (ref 0.0–0.1)
Basophils Relative: 1 % (ref 0.0–3.0)
Eosinophils Absolute: 0.3 10*3/uL (ref 0.0–0.7)
Eosinophils Relative: 3.8 % (ref 0.0–5.0)
HCT: 54 % — ABNORMAL HIGH (ref 39.0–52.0)
Hemoglobin: 18.2 g/dL (ref 13.0–17.0)
Lymphocytes Relative: 28 % (ref 12.0–46.0)
Lymphs Abs: 2.2 10*3/uL (ref 0.7–4.0)
MCHC: 33.7 g/dL (ref 30.0–36.0)
MCV: 91.3 fl (ref 78.0–100.0)
Monocytes Absolute: 0.8 10*3/uL (ref 0.1–1.0)
Monocytes Relative: 10.4 % (ref 3.0–12.0)
Neutro Abs: 4.5 10*3/uL (ref 1.4–7.7)
Neutrophils Relative %: 56.8 % (ref 43.0–77.0)
Platelets: 203 10*3/uL (ref 150.0–400.0)
RBC: 5.92 Mil/uL — ABNORMAL HIGH (ref 4.22–5.81)
RDW: 14.1 % (ref 11.5–15.5)
WBC: 7.9 10*3/uL (ref 4.0–10.5)

## 2019-02-12 LAB — BASIC METABOLIC PANEL
BUN: 19 mg/dL (ref 6–23)
CO2: 28 mEq/L (ref 19–32)
Calcium: 9.9 mg/dL (ref 8.4–10.5)
Chloride: 100 mEq/L (ref 96–112)
Creatinine, Ser: 1.15 mg/dL (ref 0.40–1.50)
GFR: 64.55 mL/min (ref >60.00)
Glucose, Bld: 88 mg/dL (ref 70–99)
Potassium: 4.3 mEq/L (ref 3.5–5.1)
Sodium: 135 mEq/L (ref 135–145)

## 2019-02-12 LAB — LIPID PANEL
Cholesterol: 229 mg/dL — ABNORMAL HIGH (ref 0–200)
HDL: 39.2 mg/dL (ref >39.00)
NonHDL: 189.75
Total CHOL/HDL Ratio: 6
Triglycerides: 321 mg/dL — ABNORMAL HIGH (ref 0.0–149.0)
VLDL: 64.2 mg/dL — ABNORMAL HIGH (ref 0.0–40.0)

## 2019-02-12 LAB — LDL CHOLESTEROL, DIRECT: Direct LDL: 158 mg/dL

## 2019-02-12 LAB — HEPATIC FUNCTION PANEL
ALT: 45 U/L (ref 0–53)
AST: 25 U/L (ref 0–37)
Albumin: 4.4 g/dL (ref 3.5–5.2)
Alkaline Phosphatase: 60 U/L (ref 39–117)
Bilirubin, Direct: 0.1 mg/dL (ref 0.0–0.3)
Total Bilirubin: 0.5 mg/dL (ref 0.2–1.2)
Total Protein: 6.9 g/dL (ref 6.0–8.3)

## 2019-02-12 MED ORDER — LAMOTRIGINE 100 MG PO TABS: 100.0000 mg | ORAL_TABLET | Freq: Every day | ORAL | 3 refills | Status: DC

## 2019-02-12 MED ORDER — TRAMADOL HCL 50 MG PO TABS: 50.0000 mg | ORAL_TABLET | Freq: Three times a day (TID) | ORAL | 1 refills | Status: DC | PRN

## 2019-02-12 MED ORDER — VITAMIN D3 50 MCG (2000 UT) PO CAPS: 2000.0000 [IU] | ORAL_CAPSULE | Freq: Every day | ORAL | 3 refills | Status: DC

## 2019-02-12 NOTE — Assessment & Plan Note (Signed)
We discussed age appropriate health related issues, including available/recomended screening tests and vaccinations. We discussed a need for adhering to healthy diet and exercise. Labs were ordered to be later reviewed . All questions were answered. CT calcium score test offered  Colon due 2023

## 2019-02-12 NOTE — Patient Instructions (Signed)
Cardiac CT calcium scoring test $150   Computed tomography, more commonly known as a CT or CAT scan, is a diagnostic medical imaging test. Like traditional x-rays, it produces multiple images or pictures of the inside of the body. The cross-sectional images generated during a CT scan can be reformatted in multiple planes. They can even generate three-dimensional images. These images can be viewed on a computer monitor, printed on film or by a 3D printer, or transferred to a CD or DVD. CT images of internal organs, bones, soft tissue and blood vessels provide greater detail than traditional x-rays, particularly of soft tissues and blood vessels. A cardiac CT scan for coronary calcium is a non-invasive way of obtaining information about the presence, location and extent of calcified plaque in the coronary arteries-the vessels that supply oxygen-containing blood to the heart muscle. Calcified plaque results when there is a build-up of fat and other substances under the inner layer of the artery. This material can calcify which signals the presence of atherosclerosis, a disease of the vessel wall, also called coronary artery disease (CAD). People with this disease have an increased risk for heart attacks. In addition, over time, progression of plaque build up (CAD) can narrow the arteries or even close off blood flow to the heart. The result may be chest pain, sometimes called "angina," or a heart attack. Because calcium is a marker of CAD, the amount of calcium detected on a cardiac CT scan is a helpful prognostic tool. The findings on cardiac CT are expressed as a calcium score. Another name for this test is coronary artery calcium scoring.  What are some common uses of the procedure? The goal of cardiac CT scan for calcium scoring is to determine if CAD is present and to what extent, even if there are no symptoms. It is a screening study that may be recommended by a physician for patients with risk factors  for CAD but no clinical symptoms. The major risk factors for CAD are: . high blood cholesterol levels  . family history of heart attacks  . diabetes  . high blood pressure  . cigarette smoking  . overweight or obese  . physical inactivity   A negative cardiac CT scan for calcium scoring shows no calcification within the coronary arteries. This suggests that CAD is absent or so minimal it cannot be seen by this technique. The chance of having a heart attack over the next two to five years is very low under these circumstances. A positive test means that CAD is present, regardless of whether or not the patient is experiencing any symptoms. The amount of calcification-expressed as the calcium score-may help to predict the likelihood of a myocardial infarction (heart attack) in the coming years and helps your medical doctor or cardiologist decide whether the patient may need to take preventive medicine or undertake other measures such as diet and exercise to lower the risk for heart attack. The extent of CAD is graded according to your calcium score:  Calcium Score  Presence of CAD (coronary artery disease)  0 No evidence of CAD   1-10 Minimal evidence of CAD  11-100 Mild evidence of CAD  101-400 Moderate evidence of CAD  Over 400 Extensive evidence of CAD    

## 2019-02-12 NOTE — Assessment & Plan Note (Signed)
BP OK at home Losartan - not taking

## 2019-02-12 NOTE — Assessment & Plan Note (Signed)
Lamictal 

## 2019-02-12 NOTE — Assessment & Plan Note (Signed)
CBC

## 2019-02-12 NOTE — Progress Notes (Signed)
Subjective:  Patient ID: Jonathon Griffin, male    DOB: 1955-06-14  Age: 63 y.o. MRN: [id number]  CC: No chief complaint on file.   HPI Jonathon Griffin presents for a well exam C/o OA - hands F/u depression - much better  Outpatient Medications Prior to Visit  Medication Sig Dispense Refill  . doxepin (SINEQUAN) 10 MG capsule Take 1 capsule (10 mg total) by mouth at bedtime as needed (insomnia). 30 capsule 0  . hydrOXYzine (ATARAX/VISTARIL) 25 MG tablet Take 1 tablet (25 mg total) by mouth at bedtime and may repeat dose one time if needed. 30 tablet 0  . mirtazapine (REMERON) 15 MG tablet Take 1 tablet (15 mg total) by mouth at bedtime. For mood control 30 tablet 0  . traMADol (ULTRAM) 50 MG tablet Take 1 tablet (50 mg total) by mouth every 8 (eight) hours as needed for severe pain. Patient needs office visit before refills will be given 90 tablet 0  . venlafaxine XR (EFFEXOR-XR) 75 MG 24 hr capsule Take 1 capsule (75 mg total) by mouth daily with breakfast. For mood control 30 capsule 0   No facility-administered medications prior to visit.     ROS: Review of Systems  Constitutional: Negative for appetite change, fatigue and unexpected weight change.  HENT: Negative for congestion, nosebleeds, sneezing, sore throat and trouble swallowing.   Eyes: Negative for itching and visual disturbance.  Respiratory: Negative for cough.   Cardiovascular: Negative for chest pain, palpitations and leg swelling.  Gastrointestinal: Negative for abdominal distention, blood in stool, diarrhea and nausea.  Genitourinary: Negative for frequency and hematuria.  Musculoskeletal: Negative for back pain, gait problem, joint swelling and neck pain.  Skin: Negative for rash.  Neurological: Negative for dizziness, tremors, speech difficulty and weakness.  Psychiatric/Behavioral: Negative for agitation, dysphoric mood, sleep disturbance and suicidal ideas. The patient is not nervous/anxious.     Objective:   BP 126/80 (BP Location: Left Arm, Patient Position: Sitting, Cuff Size: Normal)   Pulse 64   Temp 98.3 F (36.8 C) (Oral)   Ht 5\' 10"  (1.778 m)   Wt 206 lb (93.4 kg)   SpO2 96%   BMI 29.56 kg/m   BP Readings from Last 3 Encounters:  02/12/19 126/80  03/11/18 (!) 144/98  02/12/18 132/85    Wt Readings from Last 3 Encounters:  02/12/19 206 lb (93.4 kg)  03/10/18 181 lb 14.1 oz (82.5 kg)  02/11/18 182 lb (82.6 kg)    Physical Exam Constitutional:      General: He is not in acute distress.    Appearance: He is well-developed.     Comments: NAD  Eyes:     Conjunctiva/sclera: Conjunctivae normal.     Pupils: Pupils are equal, round, and reactive to light.  Neck:     Musculoskeletal: Normal range of motion.     Thyroid: No thyromegaly.     Vascular: No JVD.  Cardiovascular:     Rate and Rhythm: Normal rate and regular rhythm.     Heart sounds: Normal heart sounds. No murmur. No friction rub. No gallop.   Pulmonary:     Effort: Pulmonary effort is normal. No respiratory distress.     Breath sounds: Normal breath sounds. No wheezing or rales.  Chest:     Chest wall: No tenderness.  Abdominal:     General: Bowel sounds are normal. There is no distension.     Palpations: Abdomen is soft. There is no mass.  Tenderness: There is no abdominal tenderness. There is no guarding or rebound.  Musculoskeletal: Normal range of motion.        General: No tenderness.  Lymphadenopathy:     Cervical: No cervical adenopathy.  Skin:    General: Skin is warm and dry.     Findings: No rash.  Neurological:     Mental Status: He is alert and oriented to person, place, and time.     Cranial Nerves: No cranial nerve deficit.     Motor: No abnormal muscle tone.     Coordination: Coordination normal.     Gait: Gait normal.     Deep Tendon Reflexes: Reflexes are normal and symmetric.  Psychiatric:        Behavior: Behavior normal.        Thought Content: Thought content normal.         Judgment: Judgment normal.    Pt declined rectal   Lab Results  Component Value Date   WBC 7.2 03/10/2018   HGB 12.8 (L) 03/10/2018   HCT 38.7 (L) 03/10/2018   PLT 277 03/10/2018   GLUCOSE 117 (H) 03/10/2018   CHOL 229 (H) 08/10/2017   TRIG 180.0 (H) 08/10/2017   HDL 40.60 08/10/2017   LDLDIRECT 228.0 03/12/2009   LDLCALC 152 (H) 08/10/2017   ALT 36 03/10/2018   AST 25 03/10/2018   NA 137 03/10/2018   K 3.3 (L) 03/10/2018   CL 107 03/10/2018   CREATININE 0.91 03/10/2018   BUN 16 03/10/2018   CO2 24 03/10/2018   TSH 2.327 02/25/2018   PSA 2.87 08/10/2017    No results found.  Assessment & Plan:   There are no diagnoses linked to this encounter.   No orders of the defined types were placed in this encounter.    Follow-up: No follow-ups on file.  Walker Kehr, MD

## 2019-02-12 NOTE — Telephone Encounter (Signed)
Critical results from [facility], requesting call back.  Saa

## 2019-02-12 NOTE — Assessment & Plan Note (Signed)
On Lamictal 

## 2019-02-12 NOTE — Telephone Encounter (Signed)
'  Saa' with [facility] calling to report HGB critical value ...18.2.   1815: Left VM for Dr. Quay Burow, on call provider to [facility] for results.  1830: Dr. Quay Burow called back, alerted of critical value.

## 2019-02-13 LAB — TSH: TSH: 2.2 u[IU]/mL (ref 0.35–4.50)

## 2019-02-13 LAB — PSA: PSA: 0.94 ng/mL (ref 0.10–4.00)

## 2019-02-13 NOTE — Telephone Encounter (Signed)
See other TE.

## 2019-04-06 HISTORY — PX: CARDIAC CATHETERIZATION: SHX172

## 2019-04-06 LAB — PROTIME-INR: INR: 1.3 — AB (ref 0.9–1.1)

## 2019-05-01 ENCOUNTER — Inpatient Hospital Stay
Admission: EM | Admit: 2019-05-01 | Discharge: 2019-05-14 | Disposition: A | Discharge status: Skilled Nursing Facility | Payer: 59 | Attending: Internal Medicine | Admitting: Internal Medicine

## 2019-05-01 ENCOUNTER — Institutional Professional Consult (permissible substitution) (HOSPITAL_COMMUNITY): Payer: 59

## 2019-05-01 ENCOUNTER — Other Ambulatory Visit (HOSPITAL_COMMUNITY): Payer: 59

## 2019-05-01 DIAGNOSIS — J9621 Acute and chronic respiratory failure with hypoxia: Secondary | ICD-10-CM | POA: Diagnosis present

## 2019-05-01 DIAGNOSIS — I469 Cardiac arrest, cause unspecified: Secondary | ICD-10-CM | POA: Diagnosis present

## 2019-05-01 DIAGNOSIS — I213 ST elevation (STEMI) myocardial infarction of unspecified site: Secondary | ICD-10-CM | POA: Diagnosis present

## 2019-05-01 DIAGNOSIS — G4733 Obstructive sleep apnea (adult) (pediatric): Secondary | ICD-10-CM | POA: Diagnosis present

## 2019-05-01 DIAGNOSIS — J9690 Respiratory failure, unspecified, unspecified whether with hypoxia or hypercapnia: Secondary | ICD-10-CM

## 2019-05-01 DIAGNOSIS — I63429 Cerebral infarction due to embolism of unspecified anterior cerebral artery: Secondary | ICD-10-CM | POA: Diagnosis present

## 2019-05-01 DIAGNOSIS — J690 Pneumonitis due to inhalation of food and vomit: Secondary | ICD-10-CM | POA: Diagnosis present

## 2019-05-01 DIAGNOSIS — Z4659 Encounter for fitting and adjustment of other gastrointestinal appliance and device: Secondary | ICD-10-CM

## 2019-05-01 HISTORY — DX: Acute and chronic respiratory failure with hypoxia: J96.21

## 2019-05-01 HISTORY — DX: Obstructive sleep apnea (adult) (pediatric): G47.33

## 2019-05-01 HISTORY — DX: Cardiac arrest, cause unspecified: I46.9

## 2019-05-01 HISTORY — DX: Cerebral infarction due to embolism of unspecified anterior cerebral artery: I63.429

## 2019-05-01 HISTORY — DX: Pneumonitis due to inhalation of food and vomit: J69.0

## 2019-05-01 HISTORY — DX: ST elevation (STEMI) myocardial infarction of unspecified site: I21.3

## 2019-05-01 MED ORDER — IOHEXOL 300 MG/ML  SOLN: 30.0000 mL | Freq: Once | INTRAMUSCULAR | Status: DC | PRN

## 2019-05-02 ENCOUNTER — Telehealth: Payer: Self-pay

## 2019-05-02 DIAGNOSIS — J9621 Acute and chronic respiratory failure with hypoxia: Secondary | ICD-10-CM

## 2019-05-02 DIAGNOSIS — I63429 Cerebral infarction due to embolism of unspecified anterior cerebral artery: Secondary | ICD-10-CM

## 2019-05-02 DIAGNOSIS — J690 Pneumonitis due to inhalation of food and vomit: Secondary | ICD-10-CM

## 2019-05-02 DIAGNOSIS — I213 ST elevation (STEMI) myocardial infarction of unspecified site: Secondary | ICD-10-CM

## 2019-05-02 DIAGNOSIS — I469 Cardiac arrest, cause unspecified: Secondary | ICD-10-CM

## 2019-05-02 DIAGNOSIS — G4733 Obstructive sleep apnea (adult) (pediatric): Secondary | ICD-10-CM

## 2019-05-02 LAB — CBC WITH DIFFERENTIAL/PLATELET
Abs Immature Granulocytes: 0.05 10*3/uL (ref 0.00–0.07)
Basophils Absolute: 0.1 10*3/uL (ref 0.0–0.1)
Basophils Relative: 1 %
Eosinophils Absolute: 0.6 10*3/uL — ABNORMAL HIGH (ref 0.0–0.5)
Eosinophils Relative: 8 %
HCT: 37 % — ABNORMAL LOW (ref 39.0–52.0)
Hemoglobin: 11.9 g/dL — ABNORMAL LOW (ref 13.0–17.0)
Immature Granulocytes: 1 %
Lymphocytes Relative: 15 %
Lymphs Abs: 1.2 10*3/uL (ref 0.7–4.0)
MCH: 32 pg (ref 26.0–34.0)
MCHC: 32.2 g/dL (ref 30.0–36.0)
MCV: 99.5 fL (ref 80.0–100.0)
Monocytes Absolute: 0.7 10*3/uL (ref 0.1–1.0)
Monocytes Relative: 9 %
Neutro Abs: 5.3 10*3/uL (ref 1.7–7.7)
Neutrophils Relative %: 66 %
Platelets: 396 10*3/uL (ref 150–400)
RBC: 3.72 MIL/uL — ABNORMAL LOW (ref 4.22–5.81)
RDW: 15.5 % (ref 11.5–15.5)
WBC: 8 10*3/uL (ref 4.0–10.5)
nRBC: 0 % (ref 0.0–0.2)

## 2019-05-02 LAB — MAGNESIUM: Magnesium: 1.9 mg/dL (ref 1.7–2.4)

## 2019-05-02 LAB — COMPREHENSIVE METABOLIC PANEL
ALT: 75 U/L — ABNORMAL HIGH (ref 0–44)
AST: 38 U/L (ref 15–41)
Albumin: 2.4 g/dL — ABNORMAL LOW (ref 3.5–5.0)
Alkaline Phosphatase: 104 U/L (ref 38–126)
Anion gap: 10 (ref 5–15)
BUN: 13 mg/dL (ref 8–23)
CO2: 23 mmol/L (ref 22–32)
Calcium: 8.8 mg/dL — ABNORMAL LOW (ref 8.9–10.3)
Chloride: 104 mmol/L (ref 98–111)
Creatinine, Ser: 0.74 mg/dL (ref 0.61–1.24)
GFR calc Af Amer: >60 mL/min (ref >60)
GFR calc non Af Amer: >60 mL/min (ref >60)
Glucose, Bld: 103 mg/dL — ABNORMAL HIGH (ref 70–99)
Potassium: 4.1 mmol/L (ref 3.5–5.1)
Sodium: 137 mmol/L (ref 135–145)
Total Bilirubin: 1.4 mg/dL — ABNORMAL HIGH (ref 0.3–1.2)
Total Protein: 6.2 g/dL — ABNORMAL LOW (ref 6.5–8.1)

## 2019-05-02 LAB — APTT: aPTT: 27 seconds (ref 24–36)

## 2019-05-02 LAB — PHOSPHORUS: Phosphorus: 3.4 mg/dL (ref 2.5–4.6)

## 2019-05-02 NOTE — Telephone Encounter (Signed)
Copied from [place] [phone] [id number]. Topic: General - Other >> May 01, 2019  9:34 AM Berneta Levins wrote: Reason for CRM:   Apolonio Schneiders with [facility] calling.  States that they need to make PCP aware that pt is currently hospitalized at [facility] and is being transferred to [place].  Apolonio Schneiders will be medical case manager at [facility] and will be following him from [facility] into next steps for coordination support. Apolonio Schneiders can be reached at [phone]

## 2019-05-02 NOTE — Consult Note (Signed)
Pulmonary [place]  PULMONARY SERVICE  Date of Service: 05/02/2019  PULMONARY CRITICAL CARE CONSULT   HUBERT RAATZ  ERX:[id number]  DOB: 12/26/1955   DOA: 05/01/2019  Referring Physician: Merton Border, MD  HPI: Jonathon Griffin is a 63 y.o. male seen for follow up of Acute on Chronic Respiratory Failure.  Patient has multiple medical problems including pneumonia embolic strokes STEMI who came into the hospital because of a apparent cardiac arrest.  Patient at that time was found to have an acute STEMI and developed aspiration pneumonia.  Patient also went into shock which was felt to be sepsis secondary to pneumonia.  Patient also did suffer acute strokes.  Hospital course was complicated requiring drainage of hemothorax.  Subsequently patient was not able to wean off the ventilator ended up having to have a tracheostomy done.  Right now appears to be comfortable without distress and is on T collar  Review of Systems:  ROS performed and is unremarkable other than noted above.  Past Medical History:  Diagnosis Date  . Anxiety   . Arthritis   . Depression   . Hypertension   . Sleep apnea     Past Surgical History:  Procedure Laterality Date  . bicep surgery      Social History:    reports that he has never smoked. He has never used smokeless tobacco. He reports that he does not drink alcohol or use drugs.  Family History: Non-Contributory to the present illness  Allergies  Allergen Reactions  . Oxycodone-Acetaminophen     REACTION: itching  . Wellbutrin [Bupropion]     tremors    Medications: Reviewed on Rounds  Physical Exam:  Vitals: Temperature 98.4 pulse 85 respiratory rate 34 blood pressure 126/88 saturations 100%  Ventilator Settings off the ventilator on T collar currently on 28% FiO2  . General: Comfortable at this time . Eyes: Grossly normal lids, irises & conjunctiva . ENT: grossly tongue is  normal . Neck: no obvious mass . Cardiovascular: S1-S2 normal no gallop or rub . Respiratory: No rhonchi no rales are noted at this time . Abdomen: Soft and nontender . Skin: no rash seen on limited exam . Musculoskeletal: not rigid . Psychiatric:unable to assess . Neurologic: no seizure no involuntary movements         Labs on Admission:  Basic Metabolic Panel: Recent Labs  Lab 05/02/19 0529  NA 137  K 4.1  CL 104  CO2 23  GLUCOSE 103*  BUN 13  CREATININE 0.74  CALCIUM 8.8*  MG 1.9  PHOS 3.4    No results for input(s): PHART, PCO2ART, PO2ART, HCO3, O2SAT in the last 168 hours.  Liver Function Tests: Recent Labs  Lab 05/02/19 0529  AST 38  ALT 75*  ALKPHOS 104  BILITOT 1.4*  PROT 6.2*  ALBUMIN 2.4*   No results for input(s): LIPASE, AMYLASE in the last 168 hours. No results for input(s): AMMONIA in the last 168 hours.  CBC: Recent Labs  Lab 05/02/19 0529  WBC 8.0  NEUTROABS 5.3  HGB 11.9*  HCT 37.0*  MCV 99.5  PLT 396    Cardiac Enzymes: No results for input(s): CKTOTAL, CKMB, CKMBINDEX, TROPONINI in the last 168 hours.  BNP (last 3 results) No results for input(s): BNP in the last 8760 hours.  ProBNP (last 3 results) No results for input(s): PROBNP in the last 8760 hours.   Radiological Exams on Admission: DG ABDOMEN PEG TUBE LOCATION  Result  Date: 05/01/2019 CLINICAL DATA:  Check gastrostomy catheter placement EXAM: ABDOMEN - 1 VIEW COMPARISON:  None. FINDINGS: Contrast was administered via the gastrostomy catheter. Catheter is noted within the midportion of stomach with flow of contrast into the stomach. Nonobstructive bowel gas pattern is seen. No other focal abnormality is noted. IMPRESSION: Gastrostomy catheter in place. Electronically Signed   By: Alcide Clever M.D.   On: 05/01/2019 23:47   DG CHEST PORT 1 VIEW  Result Date: 05/01/2019 CLINICAL DATA:  Respiratory failure EXAM: PORTABLE CHEST 1 VIEW COMPARISON:  07/07/14 FINDINGS:  Tracheostomy tube is noted in satisfactory position. Postsurgical changes in the cervical spine are seen. Cardiac shadow is accentuated by the portable technique but likely within normal limits. The lungs are well aerated bilaterally. No focal infiltrate or effusion is seen. No bony abnormality is noted. IMPRESSION: No acute abnormality noted. Electronically Signed   By: Alcide Clever M.D.   On: 05/01/2019 23:46    Assessment/Plan Active Problems:   Acute on chronic respiratory failure with hypoxia (HCC)   Obstructive sleep apnea   Aspiration pneumonia due to gastric secretions (HCC)   Acute ST elevation myocardial infarction (STEMI) [facility])   Cardiac arrest (HCC)   Embolic stroke involving anterior cerebral artery (HCC)   1. Acute on chronic respiratory failure with hypoxia patient currently is off the ventilator has been on T collar tolerating it well.  Plan is to continue T collar trials.  Titrate oxygen as tolerated last chest film that was done to look good without any acute abnormalities. 2. Pneumonia due to aspiration treated we will follow radiologically.  The patient last chest x-ray actually looks good 3. Hemothorax patient is status post drainage chest x-ray looks good we will continue to monitor. 4. Acute STEMI now is stable patient did suffer cardiac arrest as already noted had a cardiac catheterization done and ended up with a stent along with distal angioplasty. 5. Embolic stroke stable continue with physical therapy as tolerated 6. Obstructive sleep apnea will need to monitor as we progress along with the weaning.  I have personally seen and evaluated the patient, evaluated laboratory and imaging results, formulated the assessment and plan and placed orders. The Patient requires high complexity decision making for assessment and support.  Case was discussed on Rounds with the Respiratory Therapy Staff Time Spent  Yevonne Pax, MD [facility] Pulmonary Critical Care  Medicine Sleep Medicine

## 2019-05-03 DIAGNOSIS — I213 ST elevation (STEMI) myocardial infarction of unspecified site: Secondary | ICD-10-CM | POA: Diagnosis not present

## 2019-05-03 DIAGNOSIS — J9621 Acute and chronic respiratory failure with hypoxia: Secondary | ICD-10-CM | POA: Diagnosis not present

## 2019-05-03 DIAGNOSIS — J690 Pneumonitis due to inhalation of food and vomit: Secondary | ICD-10-CM | POA: Diagnosis not present

## 2019-05-03 DIAGNOSIS — I469 Cardiac arrest, cause unspecified: Secondary | ICD-10-CM | POA: Diagnosis not present

## 2019-05-03 NOTE — Telephone Encounter (Signed)
Noted. Thx.

## 2019-05-03 NOTE — Progress Notes (Signed)
Pulmonary Critical Care Medicine [place]   PULMONARY CRITICAL CARE SERVICE  PROGRESS NOTE  Date of Service: 05/03/2019  Jonathon Griffin  IWL:[id number]  DOB: 05/09/1956   DOA: 05/01/2019  Referring Physician: Merton Border, MD  HPI: Jonathon Griffin is a 63 y.o. male seen for follow up of Acute on Chronic Respiratory Failure.  Patient at this time is on no T collar currently is on 28% FiO2 gradually weaning.  Reported to be periodically agitated which is being addressed by the primary care team.  Medications: Reviewed on Rounds  Physical Exam:  Vitals: Temperature 96.3 pulse 100 respiratory rate 16 blood pressure 144/74 saturations 98%  Ventilator Settings off the ventilator on T collar currently on 28% FiO2  . General: Comfortable at this time . Eyes: Grossly normal lids, irises & conjunctiva . ENT: grossly tongue is normal . Neck: no obvious mass . Cardiovascular: S1 S2 normal no gallop . Respiratory: No rhonchi no rales are noted at this time . Abdomen: soft . Skin: no rash seen on limited exam . Musculoskeletal: not rigid . Psychiatric:unable to assess . Neurologic: no seizure no involuntary movements         Lab Data:   Basic Metabolic Panel: Recent Labs  Lab 05/02/19 0529  NA 137  K 4.1  CL 104  CO2 23  GLUCOSE 103*  BUN 13  CREATININE 0.74  CALCIUM 8.8*  MG 1.9  PHOS 3.4    ABG: No results for input(s): PHART, PCO2ART, PO2ART, HCO3, O2SAT in the last 168 hours.  Liver Function Tests: Recent Labs  Lab 05/02/19 0529  AST 38  ALT 75*  ALKPHOS 104  BILITOT 1.4*  PROT 6.2*  ALBUMIN 2.4*   No results for input(s): LIPASE, AMYLASE in the last 168 hours. No results for input(s): AMMONIA in the last 168 hours.  CBC: Recent Labs  Lab 05/02/19 0529  WBC 8.0  NEUTROABS 5.3  HGB 11.9*  HCT 37.0*  MCV 99.5  PLT 396    Cardiac Enzymes: No results for input(s): CKTOTAL, CKMB, CKMBINDEX, TROPONINI in the last 168  hours.  BNP (last 3 results) No results for input(s): BNP in the last 8760 hours.  ProBNP (last 3 results) No results for input(s): PROBNP in the last 8760 hours.  Radiological Exams: DG ABDOMEN PEG TUBE LOCATION  Result Date: 05/01/2019 CLINICAL DATA:  Check gastrostomy catheter placement EXAM: ABDOMEN - 1 VIEW COMPARISON:  None. FINDINGS: Contrast was administered via the gastrostomy catheter. Catheter is noted within the midportion of stomach with flow of contrast into the stomach. Nonobstructive bowel gas pattern is seen. No other focal abnormality is noted. IMPRESSION: Gastrostomy catheter in place. Electronically Signed   By: Inez Catalina M.D.   On: 05/01/2019 23:47   DG CHEST PORT 1 VIEW  Result Date: 05/01/2019 CLINICAL DATA:  Respiratory failure EXAM: PORTABLE CHEST 1 VIEW COMPARISON:  07/07/14 FINDINGS: Tracheostomy tube is noted in satisfactory position. Postsurgical changes in the cervical spine are seen. Cardiac shadow is accentuated by the portable technique but likely within normal limits. The lungs are well aerated bilaterally. No focal infiltrate or effusion is seen. No bony abnormality is noted. IMPRESSION: No acute abnormality noted. Electronically Signed   By: Inez Catalina M.D.   On: 05/01/2019 23:46    Assessment/Plan Active Problems:   Acute on chronic respiratory failure with hypoxia (HCC)   Obstructive sleep apnea   Aspiration pneumonia due to gastric secretions (HCC)   Acute ST elevation myocardial  infarction (STEMI) [facility])   Cardiac arrest (HCC)   Embolic stroke involving anterior cerebral artery (HCC)   1. Acute on chronic respiratory failure with hypoxia plan is to continue with weaning on T collar as tolerated continue secretion management pulmonary toilet. 2. Obstructive sleep apnea will need to monitor closely as we proceed with the weaning. 3. Aspiration pneumonia treated follow-up x-ray looks good 4. Acute STEMI stable 5. Cardiac arrest rhythm has been  stable 6. Embolic stroke continue with therapy as tolerated   I have personally seen and evaluated the patient, evaluated laboratory and imaging results, formulated the assessment and plan and placed orders. The Patient requires high complexity decision making for assessment and support.  Case was discussed on Rounds with the Respiratory Therapy Staff  Yevonne Pax, MD [facility] Pulmonary Critical Care Medicine Sleep Medicine

## 2019-05-04 DIAGNOSIS — J9621 Acute and chronic respiratory failure with hypoxia: Secondary | ICD-10-CM | POA: Diagnosis not present

## 2019-05-04 DIAGNOSIS — I213 ST elevation (STEMI) myocardial infarction of unspecified site: Secondary | ICD-10-CM | POA: Diagnosis not present

## 2019-05-04 DIAGNOSIS — J690 Pneumonitis due to inhalation of food and vomit: Secondary | ICD-10-CM | POA: Diagnosis not present

## 2019-05-04 DIAGNOSIS — I469 Cardiac arrest, cause unspecified: Secondary | ICD-10-CM | POA: Diagnosis not present

## 2019-05-04 LAB — PROTIME-INR
INR: 1.1 (ref 0.8–1.2)
Prothrombin Time: 14.3 seconds (ref 11.4–15.2)

## 2019-05-04 NOTE — Progress Notes (Signed)
Pulmonary Critical Care Medicine [place]   PULMONARY CRITICAL CARE SERVICE  PROGRESS NOTE  Date of Service: 05/04/2019  Jonathon Griffin  [id number]:[id number]  DOB: Feb 17, 1956   DOA: 05/01/2019  Referring Physician: Merton Border, MD  HPI: Jonathon Griffin is a 63 y.o. male seen for follow up of Acute on Chronic Respiratory Failure.  Patient currently is on T collar on room air.  Has been using PMV intermittently but not consistently  Medications: Reviewed on Rounds  Physical Exam:  Vitals: Temperature 97.2 pulse 91 respiratory rate 36 blood pressure 120/76 saturations 99%  Ventilator Settings off the ventilator on T collar at this time  . General: Comfortable at this time . Eyes: Grossly normal lids, irises & conjunctiva . ENT: grossly tongue is normal . Neck: no obvious mass . Cardiovascular: S1 S2 normal no gallop . Respiratory: No rhonchi no rales are noted at this time . Abdomen: soft . Skin: no rash seen on limited exam . Musculoskeletal: not rigid . Psychiatric:unable to assess . Neurologic: no seizure no involuntary movements         Lab Data:   Basic Metabolic Panel: Recent Labs  Lab 05/02/19 0529  NA 137  K 4.1  CL 104  CO2 23  GLUCOSE 103*  BUN 13  CREATININE 0.74  CALCIUM 8.8*  MG 1.9  PHOS 3.4    ABG: No results for input(s): PHART, PCO2ART, PO2ART, HCO3, O2SAT in the last 168 hours.  Liver Function Tests: Recent Labs  Lab 05/02/19 0529  AST 38  ALT 75*  ALKPHOS 104  BILITOT 1.4*  PROT 6.2*  ALBUMIN 2.4*   No results for input(s): LIPASE, AMYLASE in the last 168 hours. No results for input(s): AMMONIA in the last 168 hours.  CBC: Recent Labs  Lab 05/02/19 0529  WBC 8.0  NEUTROABS 5.3  HGB 11.9*  HCT 37.0*  MCV 99.5  PLT 396    Cardiac Enzymes: No results for input(s): CKTOTAL, CKMB, CKMBINDEX, TROPONINI in the last 168 hours.  BNP (last 3 results) No results for input(s): BNP in the last 8760  hours.  ProBNP (last 3 results) No results for input(s): PROBNP in the last 8760 hours.  Radiological Exams: No results found.  Assessment/Plan Active Problems:   Acute on chronic respiratory failure with hypoxia (HCC)   Obstructive sleep apnea   Aspiration pneumonia due to gastric secretions (HCC)   Acute ST elevation myocardial infarction (STEMI) [facility])   Cardiac arrest (HCC)   Embolic stroke involving anterior cerebral artery ([place])   1. Acute on chronic respiratory failure hypoxia currently is on T collar secretions still remain an issue tolerance of PMV also is an issue we will continue with aggressive pulmonary toilet supportive care and secretion management. 2. Obstructive sleep apnea is doing fine at this time we will continue to monitor 3. Aspiration pneumonia treated improved 4. Acute STEMI no changes right now 5. Cardiac arrest rhythm stable 6. Embolic stroke continue with physical therapy as tolerated   I have personally seen and evaluated the patient, evaluated laboratory and imaging results, formulated the assessment and plan and placed orders. The Patient requires high complexity decision making for assessment and support.  Case was discussed on Rounds with the Respiratory Therapy Staff  Allyne Gee, MD [facility] Pulmonary Critical Care Medicine Sleep Medicine

## 2019-05-05 DIAGNOSIS — I213 ST elevation (STEMI) myocardial infarction of unspecified site: Secondary | ICD-10-CM | POA: Diagnosis not present

## 2019-05-05 DIAGNOSIS — J9621 Acute and chronic respiratory failure with hypoxia: Secondary | ICD-10-CM | POA: Diagnosis not present

## 2019-05-05 DIAGNOSIS — I469 Cardiac arrest, cause unspecified: Secondary | ICD-10-CM | POA: Diagnosis not present

## 2019-05-05 DIAGNOSIS — J690 Pneumonitis due to inhalation of food and vomit: Secondary | ICD-10-CM | POA: Diagnosis not present

## 2019-05-05 LAB — PROTIME-INR
INR: 1.1 (ref 0.8–1.2)
Prothrombin Time: 14.3 seconds (ref 11.4–15.2)

## 2019-05-05 NOTE — Progress Notes (Signed)
Pulmonary Critical Care Medicine [name]   PULMONARY CRITICAL CARE SERVICE  PROGRESS NOTE  Date of Service: 05/05/2019  Jonathon Griffin  SWN:[id number]  DOB: June 13, 1955   DOA: 05/01/2019  Referring Physician: Merton Border, MD  HPI: Jonathon Griffin is a 63 y.o. male seen for follow up of Acute on Chronic Respiratory Failure.  Patient is currently on T collar has been on 28% FiO2 good saturations are noted.  Secretions and fair to moderate at this time.  Medications: Reviewed on Rounds  Physical Exam:  Vitals: Temperature 97.6 pulse 86 respiratory 20 blood pressure 129/86 saturations 98%  Ventilator Settings currently on T collar with FiO2 to 28%  . General: Comfortable at this time . Eyes: Grossly normal lids, irises & conjunctiva . ENT: grossly tongue is normal . Neck: no obvious mass . Cardiovascular: S1 S2 normal no gallop . Respiratory: No rhonchi no rales are noted at this time . Abdomen: soft . Skin: no rash seen on limited exam . Musculoskeletal: not rigid . Psychiatric:unable to assess . Neurologic: no seizure no involuntary movements         Lab Data:   Basic Metabolic Panel: Recent Labs  Lab 05/02/19 0529  NA 137  K 4.1  CL 104  CO2 23  GLUCOSE 103*  BUN 13  CREATININE 0.74  CALCIUM 8.8*  MG 1.9  PHOS 3.4    ABG: No results for input(s): PHART, PCO2ART, PO2ART, HCO3, O2SAT in the last 168 hours.  Liver Function Tests: Recent Labs  Lab 05/02/19 0529  AST 38  ALT 75*  ALKPHOS 104  BILITOT 1.4*  PROT 6.2*  ALBUMIN 2.4*   No results for input(s): LIPASE, AMYLASE in the last 168 hours. No results for input(s): AMMONIA in the last 168 hours.  CBC: Recent Labs  Lab 05/02/19 0529  WBC 8.0  NEUTROABS 5.3  HGB 11.9*  HCT 37.0*  MCV 99.5  PLT 396    Cardiac Enzymes: No results for input(s): CKTOTAL, CKMB, CKMBINDEX, TROPONINI in the last 168 hours.  BNP (last 3 results) No results for input(s): BNP in the last  8760 hours.  ProBNP (last 3 results) No results for input(s): PROBNP in the last 8760 hours.  Radiological Exams: No results found.  Assessment/Plan Active Problems:   Acute on chronic respiratory failure with hypoxia (HCC)   Obstructive sleep apnea   Aspiration pneumonia due to gastric secretions (HCC)   Acute ST elevation myocardial infarction (STEMI) [facility])   Cardiac arrest (HCC)   Embolic stroke involving anterior cerebral artery ([place])   1. Acute on chronic respiratory failure hypoxia doing fine on T collar plan is to continue T collar as tolerated continue secretion management pulmonary toilet. 2. Obstructive sleep apnea no changes are noted 3. Continue supportive care 4. Aspiration pneumonia treated resolved 5. Acute STEMI at baseline 6. Cardiac arrest rhythm stable 7. Embolic stroke no changes are noted at this time   I have personally seen and evaluated the patient, evaluated laboratory and imaging results, formulated the assessment and plan and placed orders. The Patient requires high complexity decision making for assessment and support.  Case was discussed on Rounds with the Respiratory Therapy Staff  Allyne Gee, MD [facility] Pulmonary Critical Care Medicine Sleep Medicine

## 2019-05-06 DIAGNOSIS — J690 Pneumonitis due to inhalation of food and vomit: Secondary | ICD-10-CM | POA: Diagnosis not present

## 2019-05-06 DIAGNOSIS — J9621 Acute and chronic respiratory failure with hypoxia: Secondary | ICD-10-CM | POA: Diagnosis not present

## 2019-05-06 DIAGNOSIS — I213 ST elevation (STEMI) myocardial infarction of unspecified site: Secondary | ICD-10-CM | POA: Diagnosis not present

## 2019-05-06 DIAGNOSIS — I469 Cardiac arrest, cause unspecified: Secondary | ICD-10-CM | POA: Diagnosis not present

## 2019-05-06 LAB — PROTIME-INR
INR: 1.2 (ref 0.8–1.2)
Prothrombin Time: 15.2 seconds (ref 11.4–15.2)

## 2019-05-06 NOTE — Progress Notes (Signed)
Pulmonary Critical Care Medicine [place]   PULMONARY CRITICAL CARE SERVICE  PROGRESS NOTE  Date of Service: 05/06/2019  Jonathon Griffin  [id number]  DOB: Aug 24, 1955   DOA: 05/01/2019  Referring Physician: Merton Border, MD  HPI: Jonathon Griffin is a 63 y.o. male seen for follow up of Acute on Chronic Respiratory Failure.  Patient is resting comfortably right now is on T collar has been on 20% FiO2 has been using the PMV successfully  Medications: Reviewed on Rounds  Physical Exam:  Vitals: Temperature is 98.1 pulse 81 respiratory rate 20 blood pressure is 120/76 saturations 97%  Ventilator Settings off the ventilator on T collar currently on 20% FiO2  . General: Comfortable at this time . Eyes: Grossly normal lids, irises & conjunctiva . ENT: grossly tongue is normal . Neck: no obvious mass . Cardiovascular: S1 S2 normal no gallop . Respiratory: No rhonchi coarse breath sounds are noted at this time . Abdomen: soft . Skin: no rash seen on limited exam . Musculoskeletal: not rigid . Psychiatric:unable to assess . Neurologic: no seizure no involuntary movements         Lab Data:   Basic Metabolic Panel: Recent Labs  Lab 05/02/19 0529  NA 137  K 4.1  CL 104  CO2 23  GLUCOSE 103*  BUN 13  CREATININE 0.74  CALCIUM 8.8*  MG 1.9  PHOS 3.4    ABG: No results for input(s): PHART, PCO2ART, PO2ART, HCO3, O2SAT in the last 168 hours.  Liver Function Tests: Recent Labs  Lab 05/02/19 0529  AST 38  ALT 75*  ALKPHOS 104  BILITOT 1.4*  PROT 6.2*  ALBUMIN 2.4*   No results for input(s): LIPASE, AMYLASE in the last 168 hours. No results for input(s): AMMONIA in the last 168 hours.  CBC: Recent Labs  Lab 05/02/19 0529  WBC 8.0  NEUTROABS 5.3  HGB 11.9*  HCT 37.0*  MCV 99.5  PLT 396    Cardiac Enzymes: No results for input(s): CKTOTAL, CKMB, CKMBINDEX, TROPONINI in the last 168 hours.  BNP (last 3 results) No results for  input(s): BNP in the last 8760 hours.  ProBNP (last 3 results) No results for input(s): PROBNP in the last 8760 hours.  Radiological Exams: No results found.  Assessment/Plan Active Problems:   Acute on chronic respiratory failure with hypoxia (HCC)   Obstructive sleep apnea   Aspiration pneumonia due to gastric secretions (HCC)   Acute ST elevation myocardial infarction (STEMI) [facility])   Cardiac arrest (HCC)   Embolic stroke involving anterior cerebral artery ([place])   1. Acute on chronic respiratory failure with hypoxia patient will be advanced on weaning will go ahead and start capping trials. 2. Aspiration pneumonia treated resolved 3. Obstructive sleep apnea we will monitor his sleep closely for any desaturations 4. Acute STEMI resolved 5. Cardiac arrest rhythm is stable 6. Embolic stroke no changes physical therapy as tolerated   I have personally seen and evaluated the patient, evaluated laboratory and imaging results, formulated the assessment and plan and placed orders. The Patient requires high complexity decision making for assessment and support.  Case was discussed on Rounds with the Respiratory Therapy Staff  Allyne Gee, MD [facility] Pulmonary Critical Care Medicine Sleep Medicine

## 2019-05-07 ENCOUNTER — Encounter: Payer: Self-pay | Admitting: Internal Medicine

## 2019-05-07 DIAGNOSIS — I213 ST elevation (STEMI) myocardial infarction of unspecified site: Secondary | ICD-10-CM | POA: Diagnosis not present

## 2019-05-07 DIAGNOSIS — I469 Cardiac arrest, cause unspecified: Secondary | ICD-10-CM | POA: Diagnosis present

## 2019-05-07 DIAGNOSIS — J690 Pneumonitis due to inhalation of food and vomit: Secondary | ICD-10-CM | POA: Diagnosis not present

## 2019-05-07 DIAGNOSIS — I63429 Cerebral infarction due to embolism of unspecified anterior cerebral artery: Secondary | ICD-10-CM | POA: Diagnosis present

## 2019-05-07 DIAGNOSIS — J9621 Acute and chronic respiratory failure with hypoxia: Secondary | ICD-10-CM | POA: Diagnosis not present

## 2019-05-07 DIAGNOSIS — G4733 Obstructive sleep apnea (adult) (pediatric): Secondary | ICD-10-CM | POA: Diagnosis present

## 2019-05-07 LAB — PROTIME-INR
INR: 1.3 — ABNORMAL HIGH (ref 0.8–1.2)
Prothrombin Time: 15.6 seconds — ABNORMAL HIGH (ref 11.4–15.2)

## 2019-05-07 NOTE — Progress Notes (Signed)
Pulmonary Critical Care Medicine [place]   PULMONARY CRITICAL CARE SERVICE  PROGRESS NOTE  Date of Service: 05/07/2019  Jonathon Griffin  [id number]  DOB: 1955-09-12   DOA: 05/01/2019  Referring Physician: Merton Border, MD  HPI: Jonathon Griffin is a 63 y.o. male seen for follow up of Acute on Chronic Respiratory Failure.  Patient currently is capping has been now capping for 24 hours no major issues have been noted  Medications: Reviewed on Rounds  Physical Exam:  Vitals: Temperature 98.4 pulse 78 respiratory rate 27 blood pressure 133/96 saturations 98%  Ventilator Settings off the ventilator capping at this time  . General: Comfortable at this time . Eyes: Grossly normal lids, irises & conjunctiva . ENT: grossly tongue is normal . Neck: no obvious mass . Cardiovascular: S1 S2 normal no gallop . Respiratory: No rhonchi no rales are noted at this time . Abdomen: soft . Skin: no rash seen on limited exam . Musculoskeletal: not rigid . Psychiatric:unable to assess . Neurologic: no seizure no involuntary movements         Lab Data:   Basic Metabolic Panel: Recent Labs  Lab 05/02/19 0529  NA 137  K 4.1  CL 104  CO2 23  GLUCOSE 103*  BUN 13  CREATININE 0.74  CALCIUM 8.8*  MG 1.9  PHOS 3.4    ABG: No results for input(s): PHART, PCO2ART, PO2ART, HCO3, O2SAT in the last 168 hours.  Liver Function Tests: Recent Labs  Lab 05/02/19 0529  AST 38  ALT 75*  ALKPHOS 104  BILITOT 1.4*  PROT 6.2*  ALBUMIN 2.4*   No results for input(s): LIPASE, AMYLASE in the last 168 hours. No results for input(s): AMMONIA in the last 168 hours.  CBC: Recent Labs  Lab 05/02/19 0529  WBC 8.0  NEUTROABS 5.3  HGB 11.9*  HCT 37.0*  MCV 99.5  PLT 396    Cardiac Enzymes: No results for input(s): CKTOTAL, CKMB, CKMBINDEX, TROPONINI in the last 168 hours.  BNP (last 3 results) No results for input(s): BNP in the last 8760 hours.  ProBNP (last  3 results) No results for input(s): PROBNP in the last 8760 hours.  Radiological Exams: No results found.  Assessment/Plan Active Problems:   Acute on chronic respiratory failure with hypoxia (HCC)   Obstructive sleep apnea   Aspiration pneumonia due to gastric secretions (HCC)   Acute ST elevation myocardial infarction (STEMI) [facility])   Cardiac arrest (HCC)   Embolic stroke involving anterior cerebral artery ([place])   1. Acute on chronic respiratory failure with hypoxia plan is to continue with the capping advance as tolerated 2. Obstructive sleep apnea right now stable 3. Aspiration pneumonia treated clinically is improved 4. Acute STEMI no changes are noted 5. Cardiac arrest rhythm stable 6. Embolic stroke physical therapy as tolerated   I have personally seen and evaluated the patient, evaluated laboratory and imaging results, formulated the assessment and plan and placed orders. The Patient requires high complexity decision making for assessment and support.  Case was discussed on Rounds with the Respiratory Therapy Staff  Allyne Gee, MD [facility] Pulmonary Critical Care Medicine Sleep Medicine

## 2019-05-08 ENCOUNTER — Other Ambulatory Visit (HOSPITAL_COMMUNITY): Payer: 59

## 2019-05-08 DIAGNOSIS — I469 Cardiac arrest, cause unspecified: Secondary | ICD-10-CM | POA: Diagnosis not present

## 2019-05-08 DIAGNOSIS — J9621 Acute and chronic respiratory failure with hypoxia: Secondary | ICD-10-CM | POA: Diagnosis not present

## 2019-05-08 DIAGNOSIS — J690 Pneumonitis due to inhalation of food and vomit: Secondary | ICD-10-CM | POA: Diagnosis not present

## 2019-05-08 DIAGNOSIS — I213 ST elevation (STEMI) myocardial infarction of unspecified site: Secondary | ICD-10-CM | POA: Diagnosis not present

## 2019-05-08 LAB — PROTIME-INR
INR: 1.2 (ref 0.8–1.2)
Prothrombin Time: 15.5 seconds — ABNORMAL HIGH (ref 11.4–15.2)

## 2019-05-08 NOTE — Progress Notes (Signed)
Pulmonary Critical Care Medicine [place]   PULMONARY CRITICAL CARE SERVICE  PROGRESS NOTE  Date of Service: 05/08/2019  Jonathon Griffin  [id number]  DOB: [birth year]   DOA: 05/01/2019  Referring Physician: Merton Border, MD  HPI: Jonathon Griffin is a 63 y.o. male seen for follow up of Acute on Chronic Respiratory Failure.  Patient is capping doing well.  He said no major issues noted overnight  Medications: Reviewed on Rounds  Physical Exam:  Vitals: Temperature 96.3 pulse 87 respiratory 25 blood pressure 05/26/1942 saturations 100%  Ventilator Settings capping off the ventilator  . General: Comfortable at this time . Eyes: Grossly normal lids, irises & conjunctiva . ENT: grossly tongue is normal . Neck: no obvious mass . Cardiovascular: S1 S2 normal no gallop . Respiratory: No rhonchi no rales are noted at this time . Abdomen: soft . Skin: no rash seen on limited exam . Musculoskeletal: not rigid . Psychiatric:unable to assess . Neurologic: no seizure no involuntary movements         Lab Data:   Basic Metabolic Panel: Recent Labs  Lab 05/02/19 0529  NA 137  K 4.1  CL 104  CO2 23  GLUCOSE 103*  BUN 13  CREATININE 0.74  CALCIUM 8.8*  MG 1.9  PHOS 3.4    ABG: No results for input(s): PHART, PCO2ART, PO2ART, HCO3, O2SAT in the last 168 hours.  Liver Function Tests: Recent Labs  Lab 05/02/19 0529  AST 38  ALT 75*  ALKPHOS 104  BILITOT 1.4*  PROT 6.2*  ALBUMIN 2.4*   No results for input(s): LIPASE, AMYLASE in the last 168 hours. No results for input(s): AMMONIA in the last 168 hours.  CBC: Recent Labs  Lab 05/02/19 0529  WBC 8.0  NEUTROABS 5.3  HGB 11.9*  HCT 37.0*  MCV 99.5  PLT 396    Cardiac Enzymes: No results for input(s): CKTOTAL, CKMB, CKMBINDEX, TROPONINI in the last 168 hours.  BNP (last 3 results) No results for input(s): BNP in the last 8760 hours.  ProBNP (last 3 results) No results for input(s):  PROBNP in the last 8760 hours.  Radiological Exams: No results found.  Assessment/Plan Active Problems:   Acute on chronic respiratory failure with hypoxia (HCC)   Obstructive sleep apnea   Aspiration pneumonia due to gastric secretions (HCC)   Acute ST elevation myocardial infarction (STEMI) [facility])   Cardiac arrest (HCC)   Embolic stroke involving anterior cerebral artery ([place])   1. Acute on chronic respiratory failure hypoxia plan is to continue with capping trials I will be working towards decannulation 2. Obstructive sleep apnea no issues right now 3. Aspiration pneumonia treated 4. Acute STEMI resolved 5. Cardiac arrest rhythm stable 6. Embolic stroke no change continue present management   I have personally seen and evaluated the patient, evaluated laboratory and imaging results, formulated the assessment and plan and placed orders. The Patient requires high complexity decision making for assessment and support.  Case was discussed on Rounds with the Respiratory Therapy Staff  Allyne Gee, MD [facility] Pulmonary Critical Care Medicine Sleep Medicine

## 2019-05-09 ENCOUNTER — Other Ambulatory Visit (HOSPITAL_COMMUNITY): Payer: 59

## 2019-05-09 DIAGNOSIS — I469 Cardiac arrest, cause unspecified: Secondary | ICD-10-CM | POA: Diagnosis not present

## 2019-05-09 DIAGNOSIS — J9621 Acute and chronic respiratory failure with hypoxia: Secondary | ICD-10-CM | POA: Diagnosis not present

## 2019-05-09 DIAGNOSIS — I213 ST elevation (STEMI) myocardial infarction of unspecified site: Secondary | ICD-10-CM | POA: Diagnosis not present

## 2019-05-09 DIAGNOSIS — J690 Pneumonitis due to inhalation of food and vomit: Secondary | ICD-10-CM | POA: Diagnosis not present

## 2019-05-09 LAB — BASIC METABOLIC PANEL
Anion gap: 11 (ref 5–15)
BUN: 21 mg/dL (ref 8–23)
CO2: 25 mmol/L (ref 22–32)
Calcium: 9 mg/dL (ref 8.9–10.3)
Chloride: 100 mmol/L (ref 98–111)
Creatinine, Ser: 0.84 mg/dL (ref 0.61–1.24)
GFR calc Af Amer: >60 mL/min (ref >60)
GFR calc non Af Amer: >60 mL/min (ref >60)
Glucose, Bld: 129 mg/dL — ABNORMAL HIGH (ref 70–99)
Potassium: 4.1 mmol/L (ref 3.5–5.1)
Sodium: 136 mmol/L (ref 135–145)

## 2019-05-09 LAB — CBC
HCT: 41.9 % (ref 39.0–52.0)
Hemoglobin: 13.8 g/dL (ref 13.0–17.0)
MCH: 31.6 pg (ref 26.0–34.0)
MCHC: 32.9 g/dL (ref 30.0–36.0)
MCV: 95.9 fL (ref 80.0–100.0)
Platelets: 312 10*3/uL (ref 150–400)
RBC: 4.37 MIL/uL (ref 4.22–5.81)
RDW: 14.6 % (ref 11.5–15.5)
WBC: 10.2 10*3/uL (ref 4.0–10.5)
nRBC: 0 % (ref 0.0–0.2)

## 2019-05-09 LAB — PROTIME-INR
INR: 1.4 — ABNORMAL HIGH (ref 0.8–1.2)
Prothrombin Time: 16.8 seconds — ABNORMAL HIGH (ref 11.4–15.2)

## 2019-05-09 NOTE — Progress Notes (Signed)
Pulmonary Critical Care Medicine [place]   PULMONARY CRITICAL CARE SERVICE  PROGRESS NOTE  Date of Service: 05/09/2019  SEELEY HISSONG  [id number]:[id number]  DOB: Nov 04, 1955   DOA: 05/01/2019  Referring Physician: Merton Border, MD  HPI: Jonathon Griffin is a 63 y.o. male seen for follow up of Acute on Chronic Respiratory Failure.  Patient currently is capping has been on room air doing well should be able to decannulate today  Medications: Reviewed on Rounds  Physical Exam:  Vitals: Temperature 96.5 pulse 95 respiratory 20 blood pressure 120/39 saturations 96%  Ventilator Settings capping off the ventilator at this time  . General: Comfortable at this time . Eyes: Grossly normal lids, irises & conjunctiva . ENT: grossly tongue is normal . Neck: no obvious mass . Cardiovascular: S1 S2 normal no gallop . Respiratory: No rhonchi no rales are noted at this time . Abdomen: soft . Skin: no rash seen on limited exam . Musculoskeletal: not rigid . Psychiatric:unable to assess . Neurologic: no seizure no involuntary movements         Lab Data:   Basic Metabolic Panel: Recent Labs  Lab 05/09/19 0522  NA 136  K 4.1  CL 100  CO2 25  GLUCOSE 129*  BUN 21  CREATININE 0.84  CALCIUM 9.0    ABG: No results for input(s): PHART, PCO2ART, PO2ART, HCO3, O2SAT in the last 168 hours.  Liver Function Tests: No results for input(s): AST, ALT, ALKPHOS, BILITOT, PROT, ALBUMIN in the last 168 hours. No results for input(s): LIPASE, AMYLASE in the last 168 hours. No results for input(s): AMMONIA in the last 168 hours.  CBC: Recent Labs  Lab 05/09/19 0522  WBC 10.2  HGB 13.8  HCT 41.9  MCV 95.9  PLT 312    Cardiac Enzymes: No results for input(s): CKTOTAL, CKMB, CKMBINDEX, TROPONINI in the last 168 hours.  BNP (last 3 results) No results for input(s): BNP in the last 8760 hours.  ProBNP (last 3 results) No results for input(s): PROBNP in the last 8760  hours.  Radiological Exams: DG CHEST PORT 1 VIEW  Result Date: 05/09/2019 CLINICAL DATA:  Respiratory failure. EXAM: PORTABLE CHEST 1 VIEW COMPARISON:  Chest x-ray 05/01/2019. FINDINGS: Tracheostomy noted in stable position. Heart size normal. Left base subsegmental atelectasis/scarring again noted. No acute infiltrate. No pleural effusion or pneumothorax. Prior cervical spine fusion. Degenerative change thoracic spine. IMPRESSION: 1.  Tracheostomy tube noted in stable position. 2. Mild left base subsegmental atelectasis/scarring again noted. No acute abnormality identified. Electronically Signed   By: Marcello Moores  Register   On: 05/09/2019 06:58    Assessment/Plan Active Problems:   Acute on chronic respiratory failure with hypoxia (HCC)   Obstructive sleep apnea   Aspiration pneumonia due to gastric secretions (HCC)   Acute ST elevation myocardial infarction (STEMI) [facility])   Cardiac arrest (HCC)   Embolic stroke involving anterior cerebral artery ([place])   1. Acute on chronic respiratory failure hypoxia plan to proceed to decannulation 2. Obstructive sleep apnea no change 3. Aspiration pneumonia treated improving 4. Acute STEMI treated we will continue to monitor 5. Cardiac arrest rhythm stable 6. Embolic stroke no changes at this time we will continue present management   I have personally seen and evaluated the patient, evaluated laboratory and imaging results, formulated the assessment and plan and placed orders. The Patient requires high complexity decision making for assessment and support.  Case was discussed on Rounds with the Respiratory Therapy Staff  Devyn Griffing A  Humphrey Rolls, MD [facility] Pulmonary Critical Care Medicine Sleep Medicine

## 2019-05-10 DIAGNOSIS — I469 Cardiac arrest, cause unspecified: Secondary | ICD-10-CM | POA: Diagnosis not present

## 2019-05-10 DIAGNOSIS — I213 ST elevation (STEMI) myocardial infarction of unspecified site: Secondary | ICD-10-CM | POA: Diagnosis not present

## 2019-05-10 DIAGNOSIS — J9621 Acute and chronic respiratory failure with hypoxia: Secondary | ICD-10-CM | POA: Diagnosis not present

## 2019-05-10 DIAGNOSIS — J690 Pneumonitis due to inhalation of food and vomit: Secondary | ICD-10-CM | POA: Diagnosis not present

## 2019-05-10 LAB — PROTIME-INR
INR: 1.5 — ABNORMAL HIGH (ref 0.8–1.2)
Prothrombin Time: 17.9 seconds — ABNORMAL HIGH (ref 11.4–15.2)

## 2019-05-10 NOTE — Progress Notes (Signed)
Pulmonary Critical Care Medicine [place]   PULMONARY CRITICAL CARE SERVICE  PROGRESS NOTE  Date of Service: 05/10/2019  COPE MARTE  LYY:[id number]  DOB: [birth year]   DOA: 05/01/2019  Referring Physician: Merton Border, MD  HPI: Jonathon Griffin is a 63 y.o. male seen for follow up of Acute on Chronic Respiratory Failure.  Successfully decannulated doing fairly well.  No issues overnight  Medications: Reviewed on Rounds  Physical Exam:  Vitals: Temperature 97.6 pulse 90 respiratory 29 blood pressure 120/84 saturations 96%  Ventilator Settings decannulated off the vent  . General: Comfortable at this time . Eyes: Grossly normal lids, irises & conjunctiva . ENT: grossly tongue is normal . Neck: no obvious mass . Cardiovascular: S1 S2 normal no gallop . Respiratory: No rhonchi no rales are noted at this time . Abdomen: soft . Skin: no rash seen on limited exam . Musculoskeletal: not rigid . Psychiatric:unable to assess . Neurologic: no seizure no involuntary movements         Lab Data:   Basic Metabolic Panel: Recent Labs  Lab 05/09/19 0522  NA 136  K 4.1  CL 100  CO2 25  GLUCOSE 129*  BUN 21  CREATININE 0.84  CALCIUM 9.0    ABG: No results for input(s): PHART, PCO2ART, PO2ART, HCO3, O2SAT in the last 168 hours.  Liver Function Tests: No results for input(s): AST, ALT, ALKPHOS, BILITOT, PROT, ALBUMIN in the last 168 hours. No results for input(s): LIPASE, AMYLASE in the last 168 hours. No results for input(s): AMMONIA in the last 168 hours.  CBC: Recent Labs  Lab 05/09/19 0522  WBC 10.2  HGB 13.8  HCT 41.9  MCV 95.9  PLT 312    Cardiac Enzymes: No results for input(s): CKTOTAL, CKMB, CKMBINDEX, TROPONINI in the last 168 hours.  BNP (last 3 results) No results for input(s): BNP in the last 8760 hours.  ProBNP (last 3 results) No results for input(s): PROBNP in the last 8760 hours.  Radiological Exams: DG CHEST PORT  1 VIEW  Result Date: 05/09/2019 CLINICAL DATA:  Respiratory failure. EXAM: PORTABLE CHEST 1 VIEW COMPARISON:  Chest x-ray 05/01/2019. FINDINGS: Tracheostomy noted in stable position. Heart size normal. Left base subsegmental atelectasis/scarring again noted. No acute infiltrate. No pleural effusion or pneumothorax. Prior cervical spine fusion. Degenerative change thoracic spine. IMPRESSION: 1.  Tracheostomy tube noted in stable position. 2. Mild left base subsegmental atelectasis/scarring again noted. No acute abnormality identified. Electronically Signed   By: Marcello Moores  Register   On: 05/09/2019 06:58    Assessment/Plan Active Problems:   Acute on chronic respiratory failure with hypoxia (HCC)   Obstructive sleep apnea   Aspiration pneumonia due to gastric secretions (HCC)   Acute ST elevation myocardial infarction (STEMI) [facility])   Cardiac arrest (HCC)   Embolic stroke involving anterior cerebral artery ([place])   1. Acute on chronic respiratory failure hypoxia plan is to continue with supportive care off the ventilator 2. Obstructive sleep apnea no change 3. Aspiration pneumonia treated 4. Acute STEMI no change 5. Cardiac arrest rhythm stable 6. Embolic stroke therapy as tolerated   I have personally seen and evaluated the patient, evaluated laboratory and imaging results, formulated the assessment and plan and placed orders. The Patient requires high complexity decision making for assessment and support.  Case was discussed on Rounds with the Respiratory Therapy Staff  Allyne Gee, MD [facility] Pulmonary Critical Care Medicine Sleep Medicine

## 2019-05-11 LAB — PROTIME-INR
INR: 1.7 — ABNORMAL HIGH (ref 0.8–1.2)
Prothrombin Time: 19.5 seconds — ABNORMAL HIGH (ref 11.4–15.2)

## 2019-05-12 ENCOUNTER — Other Ambulatory Visit (HOSPITAL_COMMUNITY): Payer: 59

## 2019-05-12 LAB — PROTIME-INR
INR: 1.8 — ABNORMAL HIGH (ref 0.8–1.2)
Prothrombin Time: 20.4 seconds — ABNORMAL HIGH (ref 11.4–15.2)

## 2019-05-13 LAB — SARS CORONAVIRUS 2 (TAT 6-24 HRS): SARS Coronavirus 2: NEGATIVE

## 2019-05-13 LAB — PROTIME-INR
INR: 1.8 — ABNORMAL HIGH (ref 0.8–1.2)
Prothrombin Time: 21.2 seconds — ABNORMAL HIGH (ref 11.4–15.2)

## 2019-05-14 LAB — PROTIME-INR
INR: 2.3 — ABNORMAL HIGH (ref 0.8–1.2)
Prothrombin Time: 25.4 seconds — ABNORMAL HIGH (ref 11.4–15.2)

## 2019-05-26 ENCOUNTER — Telehealth: Payer: Self-pay | Admitting: Cardiovascular Disease

## 2019-05-26 ENCOUNTER — Telehealth: Payer: Self-pay

## 2019-05-26 NOTE — Telephone Encounter (Signed)
New Message   Patient's wife is calling in to get approval to accompany patient to appointment tomorrow 05/27/2019 at 3:40 with Dr. Flora Lipps. States that patient is very weak from heart attack and needs her assistance. Please give patient a call back to confirm.

## 2019-05-26 NOTE — Telephone Encounter (Signed)
She can come in with him. He will need her help.  Reatha Harps, MD

## 2019-05-26 NOTE — Telephone Encounter (Signed)
Please advise 

## 2019-05-26 NOTE — Telephone Encounter (Signed)
Wife notified.

## 2019-05-26 NOTE — Telephone Encounter (Signed)
Copied from CRM [id number]. Topic: General - Other >> May 26, 2019 12:45 PM Jaquita Rector A wrote: Reason for CRM: Patient wife called to inquire of Dr Posey Rea where can they go to have the feeding tube (peg) removed. Six weeks will be on 06/02/2019. Asking if this cah be done in that office or can they get a recommendation. Please call Ph# [phone] with an answer. Thank you

## 2019-05-26 NOTE — Telephone Encounter (Signed)
Wife notified that only patient will be allowed at visit d/t COVID. She can be called during visit (phone # in appt notes) and she can assist him to the lobby area and wait outside lobby or in vehicle. Asked that she send with him a med list and any questions.   She also asked about his peg tube removal. Advised Dr. Flora Lipps does not do this. She then also asked for a referral for a provider who can do this.   Message routed to MD to review tomorrow with patient and to covering nurse as Lorain Childes for calling wife during appointment

## 2019-05-26 NOTE — Progress Notes (Signed)
Cardiology Office Note:   Date:  05/27/2019  NAME:  Jonathon Griffin    MRN: [id number] DOB:  1956/02/09   PCP:  Carron Curie, MD  Cardiologist:  No primary care provider on file.  Electrophysiologist:  None   Referring MD: Naoma Diener, MD   Chief Complaint  Patient presents with  . Coronary Artery Disease    History of Present Illness:   Jonathon Griffin is a 64 y.o. male with a hx of out-of-hospital cardiac arrest secondary to inferior wall STEMI, embolic stroke, OSA who is being seen today for the evaluation of CAD at the request of Carron Curie, MD.  He presented today for follow-up with cardiology.  I only have records from the rehabilitation hospital.  Per the report he was admitted to [facility] on 04/09/2019 for out-of-hospital cardiac arrest secondary to infra wall STEMI.  A stent was placed.  His wife reports he was cooled.  He underwent CPR as well.  His course there was complicated by respiratory failure secondary to aspiration pneumonia and bilateral embolic strokes.  The wife reports no history of stroke or ever being told he had a stroke.  He presents on Coumadin and per the records from the rehabilitation hospital he was on this for stroke.  He eventually required tracheostomy due to prolonged mechanical ventilation.  He also had a PEG tube placed.  They report but I have no record of this and ejection fraction of 55%.  They are a little bit frustrated as they do not have the records from [facility].  I did inform them if he did have a stroke he cannot be on prasugrel.  I also wonder about the need for triple therapy, this is a bit much.  We do need to get the records and have him come back in 1 week.  He had extensive stay in a rehabilitation hospital.  He is ultimately had his tracheostomy decannulated.  He also still has a PEG tube in place and is unclear what to do about this.  He apparently is tolerating a normal diet.  Overall, they report he is doing  quite well.  He is using a walker but able to do things on his own.    He reports no chest pain or trouble breathing.  He has no palpitations.  He reports no lower extremity edema.  His cardiac examination is benign.  His EKG is unremarkable.  Problem List: 1. [facility]CA 04/09/2019 2/2 acute inferior STEMI -s/p PCI to RCA, EF55% 2. CVA? -on coumadin  3. Tracheostomy s/p decannulation  4. Septic shock 2/2 aspiration PNA 5. Protein malnutrition s/p PEG  Past Medical History: Past Medical History:  Diagnosis Date  . Acute on chronic respiratory failure with hypoxia (HCC)   . Acute ST elevation myocardial infarction (STEMI) (HCC)   . Anxiety   . Arthritis   . Aspiration pneumonia due to gastric secretions (HCC)   . Cardiac arrest (HCC)   . Coronary artery disease   . Depression   . Embolic stroke involving anterior cerebral artery (HCC)   . Hypertension   . Obstructive sleep apnea   . Sleep apnea    Past Surgical History: Past Surgical History:  Procedure Laterality Date  . bicep surgery     Current Medications: Current Meds  Medication Sig  . aspirin EC 81 MG tablet Take 81 mg by mouth daily.  Marland Kitchen atorvastatin (LIPITOR) 80 MG tablet Take 80 mg by mouth daily at 6  PM.  . clonazePAM (KLONOPIN) 0.5 MG tablet Take 0.25 mg by mouth 2 (two) times daily.  . famotidine (PEPCID) 20 MG tablet Take 20 mg by mouth 2 (two) times daily.  . felodipine (PLENDIL) 5 MG 24 hr tablet Take 5 mg by mouth daily.  . Multiple Vitamin (MULTIVITAMIN WITH MINERALS) TABS tablet Take 1 tablet by mouth daily.  . potassium chloride SA (KLOR-CON) 20 MEQ tablet Take 20 mEq by mouth daily.  . prasugrel (EFFIENT) 10 MG TABS tablet Take 10 mg by mouth daily.  . sertraline (ZOLOFT) 25 MG tablet Take 25 mg by mouth daily.  Marland Kitchen zolpidem (AMBIEN) 10 MG tablet Take 10 mg by mouth at bedtime as needed.    Allergies:    Oxycodone-acetaminophen and Wellbutrin [bupropion]   Social History: Social History   Socioeconomic  History  . Marital status: Married    Spouse name: Not on file  . Number of children: Not on file  . Years of education: Not on file  . Highest education level: Not on file  Occupational History  . Occupation: welder  Tobacco Use  . Smoking status: Never Smoker  . Smokeless tobacco: Never Used  Substance and Sexual Activity  . Alcohol use: No  . Drug use: No  . Sexual activity: Yes  Other Topics Concern  . Not on file  Social History Narrative  . Not on file   Social Determinants of Health   Financial Resource Strain:   . Difficulty of Paying Living Expenses: Not on file  Food Insecurity:   . Worried About Charity fundraiser in the Last Year: Not on file  . Ran Out of Food in the Last Year: Not on file  Transportation Needs:   . Lack of Transportation (Medical): Not on file  . Lack of Transportation (Non-Medical): Not on file  Physical Activity:   . Days of Exercise per Week: Not on file  . Minutes of Exercise per Session: Not on file  Stress:   . Feeling of Stress : Not on file  Social Connections:   . Frequency of Communication with Friends and Family: Not on file  . Frequency of Social Gatherings with Friends and Family: Not on file  . Attends Religious Services: Not on file  . Active Member of Clubs or Organizations: Not on file  . Attends Archivist Meetings: Not on file  . Marital Status: Not on file     Family History: The patient's family history includes Arthritis in his mother and sister.  ROS:   All other ROS reviewed and negative. Pertinent positives noted in the HPI.     EKGs/Labs/Other Studies Reviewed:   The following studies were personally reviewed by me today:  EKG:  EKG is ordered today.  The ekg ordered today demonstrates normal sinus rhythm, heart rate 81, T wave inversions noted in inferior leads, no evidence of prior infarction, and was personally reviewed by me.   Recent Labs: 02/12/2019: TSH 2.20 05/02/2019: ALT 75; Magnesium  1.9 05/09/2019: BUN 21; Creatinine, Ser 0.84; Hemoglobin 13.8; Platelets 312; Potassium 4.1; Sodium 136   Recent Lipid Panel    Component Value Date/Time   CHOL 229 (H) 02/12/2019 1643   TRIG 321.0 (H) 02/12/2019 1643   HDL 39.20 02/12/2019 1643   CHOLHDL 6 02/12/2019 1643   VLDL 64.2 (H) 02/12/2019 1643   LDLCALC 152 (H) 08/10/2017 1536   LDLDIRECT 158.0 02/12/2019 1643    Physical Exam:   VS:  BP 127/85  Pulse 81   Ht 5\' 10"  (1.778 m)   Wt 178 lb (80.7 kg)   BMI 25.54 kg/m    Wt Readings from Last 3 Encounters:  05/27/19 178 lb (80.7 kg)  02/12/19 206 lb (93.4 kg)  03/10/18 181 lb 14.1 oz (82.5 kg)    General: Well nourished, well developed, in no acute distress Heart: Atraumatic, normal size  Eyes: PEERLA, EOMI  Neck: Supple, no JVD Endocrine: No thryomegaly Cardiac: Normal S1, S2; RRR; no murmurs, rubs, or gallops Lungs: Clear to auscultation bilaterally, no wheezing, rhonchi or rales  Abd: Soft, nontender, no hepatomegaly  Ext: No edema, pulses 2+ Musculoskeletal: No deformities, BUE and BLE strength normal and equal Skin: Warm and dry, no rashes , PEG tube in place Neuro: Alert and oriented to person, place, time, and situation, CNII-XII grossly intact, no focal deficits  Psych: Normal mood and affect   ASSESSMENT:   URIYAH RASKA is a 65 y.o. male who presents for the following: 1. Coronary artery disease involving native coronary artery of native heart without angina pectoris   2. Cardiac arrest (HCC)     PLAN:   1. Coronary artery disease involving native coronary artery of native heart without angina pectoris 2. Cardiac arrest [facility]) -This is a rather unfortunate visit.  I do not have the complete record from [facility].  I am really unable to guide them in their current medical endeavors. From what I can tell he had an inferior wall STEMI as well as cardiac arrest. He is on aspirin, prasugrel as well as Coumadin. Notes mention he had a stroke  however they both tell me he did not have a stroke.  If he did have a stroke he cannot be on prasugrel therapy. Additionally triple therapy would not be indicated here. I really need to see the records to determine why he was placed on Coumadin.  If we can stop the Coumadin I would continue him on DAPT. If there is a need for the Coumadin we would just stop the aspirin.  If he actually did have a stroke and have a need for anticoagulation, we would need to switch him from prasugrel to ticagrelor or Plavix and drop the aspirin. He also needs to be on a beta-blocker but it is unclear if there is a reason for not being on this therapy.  He may have had significant bradycardia and I am reluctant to start any of these medications currently. -For now we will continue his current medications as is refills.  He is on aspirin high intensity statin.  I will have him come back in 1 week and we will attempt obtain the records.  This is extremely important and critical to his care. -We will go ahead and check an echocardiogram in the interim -He has been instructed not to drive or return to work until I see him back in 1 week and we have the full records for adequate assessment.  Disposition: Return in about 1 week (around 06/03/2019).  Medication Adjustments/Labs and Tests Ordered: Current medicines are reviewed at length with the patient today.  Concerns regarding medicines are outlined above.  Orders Placed This Encounter  Procedures  . EKG 12-Lead  . ECHOCARDIOGRAM COMPLETE   No orders of the defined types were placed in this encounter.   Patient Instructions  Medication Instructions:  Your physician recommends that you continue on your current medications as directed. Please refer to the Current Medication list given to you today.  *  If you need a refill on your cardiac medications before your next appointment, please call your pharmacy*  Lab Work: none If you have labs (blood work) drawn today and  your tests are completely normal, you will receive your results only by: Marland Kitchen MyChart Message (if you have MyChart) OR . A paper copy in the mail If you have any lab test that is abnormal or we need to change your treatment, we will call you to review the results.  Testing/Procedures: Your physician has requested that you have an echocardiogram. Echocardiography is a painless test that uses sound waves to create images of your heart. It provides your doctor with information about the size and shape of your heart and how well your heart's chambers and valves are working. This procedure takes approximately one hour. There are no restrictions for this procedure. LOCATION: [facility]: [address], [place], [place] [address]     Follow-Up: At [facility], you and your health needs are our priority.  As part of our continuing mission to provide you with exceptional heart care, we have created designated Provider Care Teams.  These Care Teams include your primary Cardiologist (physician) and Advanced Practice Providers (APPs -  Physician Assistants and Nurse Practitioners) who all work together to provide you with the care you need, when you need it.  Your next appointment:   Next week  The format for your next appointment:   In Person  Provider:   Lennie Odor, MD       Signed, Lenna Gilford. Flora Lipps, MD [facility]  [address], [address] [place], [place] [address] [phone]  05/27/2019 6:13 PM

## 2019-05-27 ENCOUNTER — Inpatient Hospital Stay: Payer: 59 | Admitting: Internal Medicine

## 2019-05-27 ENCOUNTER — Other Ambulatory Visit: Payer: Self-pay

## 2019-05-27 ENCOUNTER — Ambulatory Visit (INDEPENDENT_AMBULATORY_CARE_PROVIDER_SITE_OTHER): Payer: Managed Care, Other (non HMO) | Admitting: Cardiovascular Disease

## 2019-05-27 ENCOUNTER — Encounter: Payer: Self-pay | Admitting: Cardiovascular Disease

## 2019-05-27 ENCOUNTER — Telehealth: Payer: Self-pay

## 2019-05-27 VITALS — BP 127/85 | HR 81 | Ht 70.0 in | Wt 178.0 lb

## 2019-05-27 DIAGNOSIS — I469 Cardiac arrest, cause unspecified: Secondary | ICD-10-CM

## 2019-05-27 DIAGNOSIS — I2510 Atherosclerotic heart disease of native coronary artery without angina pectoris: Secondary | ICD-10-CM

## 2019-05-27 NOTE — Telephone Encounter (Signed)
Pt's wife has been informed and stated that it was placed in [place], [place]. She stated its an issue getting him back there and wants a referral to someone who can remove it. She also stated that the pt has an OV scheduled for Monday with PCP.

## 2019-05-27 NOTE — Patient Instructions (Signed)
Medication Instructions:  Your physician recommends that you continue on your current medications as directed. Please refer to the Current Medication list given to you today.  *If you need a refill on your cardiac medications before your next appointment, please call your pharmacy*  Lab Work: none If you have labs (blood work) drawn today and your tests are completely normal, you will receive your results only by: Marland Kitchen MyChart Message (if you have MyChart) OR . A paper copy in the mail If you have any lab test that is abnormal or we need to change your treatment, we will call you to review the results.  Testing/Procedures: Your physician has requested that you have an echocardiogram. Echocardiography is a painless test that uses sound waves to create images of your heart. It provides your doctor with information about the size and shape of your heart and how well your heart's chambers and valves are working. This procedure takes approximately one hour. There are no restrictions for this procedure. LOCATION:  [facility]: [address], [place], [place] [address]     Follow-Up: At [facility], you and your health needs are our priority.  As part of our continuing mission to provide you with exceptional heart care, we have created designated Provider Care Teams.  These Care Teams include your primary Cardiologist (physician) and Advanced Practice Providers (APPs -  Physician Assistants and Nurse Practitioners) who all work together to provide you with the care you need, when you need it.  Your next appointment:   Next week  The format for your next appointment:   In Person  Provider:   Lennie Odor, MD

## 2019-05-27 NOTE — Telephone Encounter (Signed)
pls call GI doctor who placed it and sch OV w/them Thx

## 2019-05-27 NOTE — Telephone Encounter (Signed)
Dr. Flora Lipps requesting records from [facility]. Pt wife provided the following contact information and states she has contacted them herself as well during OV today.  [phone],  option 2,  Contact: Jerrye Noble to reach personnel. LMTCB and left fax number

## 2019-05-27 NOTE — Telephone Encounter (Signed)
Okay.  We can deal with it on Monday.  Thanks

## 2019-05-28 ENCOUNTER — Ambulatory Visit (HOSPITAL_COMMUNITY)
Admission: RE | Admit: 2019-05-28 | Discharge: 2019-05-28 | Disposition: A | Discharge status: Home / Self Care | Payer: Managed Care, Other (non HMO) | Source: Ambulatory Visit | Attending: Cardiovascular Disease | Admitting: Cardiovascular Disease

## 2019-05-28 ENCOUNTER — Ambulatory Visit (HOSPITAL_COMMUNITY): Payer: Managed Care, Other (non HMO)

## 2019-05-28 DIAGNOSIS — I2510 Atherosclerotic heart disease of native coronary artery without angina pectoris: Secondary | ICD-10-CM

## 2019-05-28 NOTE — Progress Notes (Signed)
*  PRELIMINARY RESULTS* Echocardiogram 2D Echocardiogram has been performed.  Stacey Drain 05/28/2019, 4:21 PM

## 2019-05-29 NOTE — Telephone Encounter (Signed)
Records received from susan

## 2019-05-29 NOTE — Telephone Encounter (Signed)
Received call back from susan who was in the process of faxing records yesterday, but stopped. Advised her on what types of records needed and most recent. She will fax these to [phone]

## 2019-05-29 NOTE — Telephone Encounter (Signed)
Contacted [facility] at [phone].  LMTCB for Jonathon Griffin or someone at med records regarding faxing pt med records to [employer]

## 2019-05-30 ENCOUNTER — Telehealth: Payer: Self-pay | Admitting: Cardiovascular Disease

## 2019-05-30 NOTE — Telephone Encounter (Signed)
EF low normal. Doing well. Received 200+ pages from [facility]. Will discuss with them next week.   Jonathon Spore T. Flora Lipps, MD [facility]  [address] [place], [place] [address] [phone]  3:53 PM

## 2019-06-01 NOTE — Progress Notes (Signed)
Cardiology Office Note:   Date:  06/03/2019  NAME:  Jonathon Griffin    MRN: [id number] DOB:  1955/10/08   PCP:  Carron Curie, MD  Cardiologist:  Reatha Harps, MD   Referring MD: Carron Curie, MD   Chief Complaint  Patient presents with  . Coronary Artery Disease    History of Present Illness:   Jonathon Griffin is a 64 y.o. male with a hx of out of hospital cardiac arrest secondary to inferior wall STEMI, bilateral cerebral infarcts who presents for follow-up of CAD.  He was seen last week with unclear details of many of his medications.  We have obtained records which show he had a cardiac arrest that was out of hospital on 04/06/2019 secondary to an inferior wall STEMI.  He underwent DES to the RCA.  It appears his course was very complicated by respiratory failure requiring tracheostomy, protein deficiency requiring PEG tube placement, acute liver injury, aspiration pneumonia, multiple subacute infarcts seen on MRI.  I reviewed the discharge summary from [facility] and he was not discharged home on warfarin.  He apparently was started on this at the long-term acute care facility.  It is really unclear why he is on Coumadin.  I suspect possibly this is because of the presumptive embolic strokes.  It is also more alarming that he was placed on prasugrel after having strokes.  He presents back after I reviewed his full records as detailed above.  He continues to do well and has no chest pain or trouble breathing.  Review of his recent laboratory data shows significantly elevated cholesterol, LDL 158 and triglycerides 202.  Total cholesterol 229.  No repeat that I can see.  He has had no further episodes of chest pain or trouble breathing.  He continues to recover after his tracheostomy as well as PEG tube.  The PEG tube is in place.  He has not returned to work yet.  He has not been evaluated by neurology.  Regarding his cardiac arrest I discussed with him that it would be okay for him  to start driving however I would like to have him evaluated by neurology to determine if there are any limitations given the embolic strokes.  I also see no reason for the Coumadin and have informed them to stop this.  He can no longer take prasugrel given the history of stroke.  He reports insomnia is the main issue.  He does have trouble sleeping at night.  Apparently he has trouble maintaining sleep.  Was on Ambien in the hospital.  Problem List 1. Cardiac Arrest 04/06/2019 2/2 inferior STEMI [facility]) -s/p PCI to RCA -course c/b aspiration PNA, acute liver injury, resp failure requiring trach, protein deficiency requiring PEG 2. Multiple strokes (subcortical R occipital lobe/R cerebellar) 3. Hyperlipidemia   Past Medical History: Past Medical History:  Diagnosis Date  . Acute on chronic respiratory failure with hypoxia (HCC)   . Acute ST elevation myocardial infarction (STEMI) (HCC)   . Anxiety   . Arthritis   . Aspiration pneumonia due to gastric secretions (HCC)   . Cardiac arrest (HCC)   . Coronary artery disease   . Depression   . Embolic stroke involving anterior cerebral artery (HCC)   . Hyperlipidemia   . Hypertension   . Obstructive sleep apnea   . Sleep apnea     Past Surgical History: Past Surgical History:  Procedure Laterality Date  . bicep surgery    .  CARDIAC CATHETERIZATION    . PEG PLACEMENT    . TRACHEOSTOMY      Current Medications: Current Meds  Medication Sig  . aspirin EC 81 MG tablet Take 81 mg by mouth daily.  Marland Kitchen atorvastatin (LIPITOR) 80 MG tablet Take 80 mg by mouth daily at 6 PM.  . clonazePAM (KLONOPIN) 0.5 MG tablet Take 0.25 mg by mouth 2 (two) times daily.  . famotidine (PEPCID) 20 MG tablet Take 20 mg by mouth 2 (two) times daily.  . Multiple Vitamin (MULTIVITAMIN WITH MINERALS) TABS tablet Take 1 tablet by mouth daily.  . sertraline (ZOLOFT) 25 MG tablet Take 25 mg by mouth daily.  . [DISCONTINUED] felodipine (PLENDIL) 5 MG  24 hr tablet Take 5 mg by mouth daily.  . [DISCONTINUED] potassium chloride SA (KLOR-CON) 20 MEQ tablet Take 20 mEq by mouth daily.  . [DISCONTINUED] prasugrel (EFFIENT) 10 MG TABS tablet Take 10 mg by mouth daily.  . [DISCONTINUED] warfarin (COUMADIN) 7.5 MG tablet Take 6 mg by mouth at bedtime.  . [DISCONTINUED] zolpidem (AMBIEN) 10 MG tablet Take 10 mg by mouth at bedtime as needed.     Allergies:    Oxycodone-acetaminophen and Wellbutrin [bupropion]   Social History: Social History   Socioeconomic History  . Marital status: Married    Spouse name: Not on file  . Number of children: Not on file  . Years of education: Not on file  . Highest education level: Not on file  Occupational History  . Occupation: welder  Tobacco Use  . Smoking status: Never Smoker  . Smokeless tobacco: Never Used  Substance and Sexual Activity  . Alcohol use: No  . Drug use: No  . Sexual activity: Yes  Other Topics Concern  . Not on file  Social History Narrative  . Not on file   Social Determinants of Health   Financial Resource Strain:   . Difficulty of Paying Living Expenses: Not on file  Food Insecurity:   . Worried About Programme researcher, broadcasting/film/video in the Last Year: Not on file  . Ran Out of Food in the Last Year: Not on file  Transportation Needs:   . Lack of Transportation (Medical): Not on file  . Lack of Transportation (Non-Medical): Not on file  Physical Activity:   . Days of Exercise per Week: Not on file  . Minutes of Exercise per Session: Not on file  Stress:   . Feeling of Stress : Not on file  Social Connections:   . Frequency of Communication with Friends and Family: Not on file  . Frequency of Social Gatherings with Friends and Family: Not on file  . Attends Religious Services: Not on file  . Active Member of Clubs or Organizations: Not on file  . Attends Banker Meetings: Not on file  . Marital Status: Not on file     Family History: The patient's family  history includes Arthritis in his mother and sister.  ROS:   All other ROS reviewed and negative. Pertinent positives noted in the HPI.     EKGs/Labs/Other Studies Reviewed:   The following studies were personally reviewed by me today:   TTE 05/28/2019 1. Left ventricular ejection fraction, by visual estimation, is 50 to 55%. The left ventricle has low normal function. There is no left ventricular hypertrophy.  2. Basal inferoseptal segment, basal inferolateral segment, basal inferior segment, and mid inferolateral segment are abnormal.  3. Left ventricular diastolic parameters are indeterminate.  4. The left  ventricle demonstrates regional wall motion abnormalities.  5. Global right ventricle has low normal systolic function.The right ventricular size is normal. No increase in right ventricular wall thickness.  6. Left atrial size was normal.  7. Right atrial size was normal.  8. The mitral valve is grossly normal. Trivial mitral valve regurgitation.  9. The tricuspid valve is grossly normal. 10. The aortic valve is tricuspid. Aortic valve regurgitation is trivial. 11. The pulmonic valve was grossly normal. Pulmonic valve regurgitation is trivial. 12. TR signal is inadequate for assessing pulmonary artery systolic pressure. 13. The inferior vena cava is normal in size with greater than 50% respiratory variability, suggesting right atrial pressure of 3 mmHg.  Recent Labs: 02/12/2019: TSH 2.20 05/02/2019: ALT 75; Magnesium 1.9 05/09/2019: BUN 21; Creatinine, Ser 0.84; Hemoglobin 13.8; Platelets 312; Potassium 4.1; Sodium 136   Recent Lipid Panel    Component Value Date/Time   CHOL 229 (H) 02/12/2019 1643   TRIG 321.0 (H) 02/12/2019 1643   HDL 39.20 02/12/2019 1643   CHOLHDL 6 02/12/2019 1643   VLDL 64.2 (H) 02/12/2019 1643   LDLCALC 152 (H) 08/10/2017 1536   LDLDIRECT 158.0 02/12/2019 1643    Physical Exam:   VS:  BP 112/75   Pulse 70   Ht 5\' 10"  (1.778 m)   Wt 179 lb (81.2  kg)   SpO2 98%   BMI 25.68 kg/m    Wt Readings from Last 3 Encounters:  06/03/19 179 lb (81.2 kg)  05/27/19 178 lb (80.7 kg)  02/12/19 206 lb (93.4 kg)    General: Well nourished, well developed, in no acute distress Heart: Atraumatic, normal size  Eyes: PEERLA, EOMI  Neck: Supple, no JVD Endocrine: No thryomegaly Cardiac: Normal S1, S2; RRR; no murmurs, rubs, or gallops Lungs: Clear to auscultation bilaterally, no wheezing, rhonchi or rales  Abd: Soft, nontender, no hepatomegaly  Ext: No edema, pulses 2+ Musculoskeletal: No deformities, BUE and BLE strength normal and equal Skin: Warm and dry, no rashes   Neuro: Alert and oriented to person, place, time, and situation, CNII-XII grossly intact, no focal deficits  Psych: Normal mood and affect   ASSESSMENT:   Jonathon Griffin is a 64 y.o. male who presents for the following: 1. Coronary artery disease involving native coronary artery of native heart without angina pectoris   2. Mixed hyperlipidemia   3. Essential hypertension   4. Cerebrovascular accident (CVA) due to other mechanism (HCC)   5. Primary insomnia     PLAN:   1. Coronary artery disease involving native coronary artery of native heart without angina pectoris -Status post V. fib arrest in the setting of inferior wall STEMI on 04/06/2019.  He had a drug-eluting stent placed to the RCA. -Selective angiography was performed in the left system was not injected -His ejection fraction is 50 to 55% with mild wall motion abnormalities seen in the RCA distribution -He continues to recover and is doing well with no symptoms of angina -Continue aspirin 81 mg daily -Due to the embolic strokes he had he cannot be on prasugrel.  We will plan to switch him to ticagrelor.  He was given strict instructions to take 180 mg of ticagrelor the day after taking Effient.  He then will resume 90 mg twice daily of prasugrel 1 day after the loading dose.  It is prudent he has a loading dose  given his stent was placed less than 3 months from now -We will continue aspirin -I have no strong indication  for Coumadin and we will stop this.  He did not leave the hospital on this.  It apparently was placed at the rehab facility.  Unclear why he would be on this. -We will start metoprolol succinate 25 mg daily -We will continue Lipitor 80 mg daily and recheck a lipid profile in the next few days -He has been given the okay to drive from a cardiovascular standpoint.  I think it is worth seeing a neurologist given the reported strokes prior to any attempted driving or return to work.  I think an overall goal would be 3 months after his cardiac arrest to return to full activities.  He does work as a Psychologist, occupational and I think he will have some issues with going back to work.  He also has a PEG tube in place which need to be removed before he goes back to work. -I informed him he will have no elective surgeries over the next year.  Routine teeth cleaning is okay.  There should be no interruption in his DAPT for this.  2. Mixed hyperlipidemia -Severe hyperlipidemia -Continue Lipitor 80 mg, LDL less than 70 is his goal -Recheck in the next day or so  3. Essential hypertension -Acceptable today.  Stop felodipine.  We will start metoprolol as above.  4. Cerebrovascular accident (CVA) due to other mechanism [facility]) -He was diagnosed with embolic strokes in the setting of his cardiac arrest.  No atrial fibrillation documented.  Really unclear etiology.  He apparently was also placed on Coumadin for this possibly at the rehab center but did not leave the hospital on this. -I see no strong indication for Coumadin at this time.  We will stop it. -I think we should pursue a 14-day ZIO patch to make sure is not having atrial fibrillation.  If he is having episodes of A. fib we would recommend anticoagulation.  I have also recommended he follow-up with neurology to see if they have any further recommendations or  evaluation.  They also need to assist [place] in when he can return to driving and other activities.  5. Primary insomnia -We will start him on trazodone 50 mg nightly  Disposition: Return in about 3 months (around 09/01/2019).  Medication Adjustments/Labs and Tests Ordered: Current medicines are reviewed at length with the patient today.  Concerns regarding medicines are outlined above.  Orders Placed This Encounter  Procedures  . Lipid Profile  . LONG TERM MONITOR (3-14 DAYS)   Meds ordered this encounter  Medications  . ticagrelor (BRILINTA) 90 MG TABS tablet    Sig: Take 2 tablets (180 mg total) by mouth once for 1 dose. 24 hours after last prasugrel dose then 90 mg twice a day thereafter.    Dispense:  2 tablet    Refill:  0  . ticagrelor (BRILINTA) 90 MG TABS tablet    Sig: Take 1 tablet (90 mg total) by mouth 2 (two) times daily.    Dispense:  180 tablet    Refill:  4  . metoprolol succinate (TOPROL-XL) 25 MG 24 hr tablet    Sig: Take 1 tablet (25 mg total) by mouth daily.    Dispense:  90 tablet    Refill:  3  . traZODone (DESYREL) 50 MG tablet    Sig: Take 1 tablet (50 mg total) by mouth at bedtime.    Dispense:  90 tablet    Refill:  3    Patient Instructions  Medication Instructions:  STOP TAKING THE PRASUGREL (EFFIENT),  COUMADIN (WARFARIN), FELODIPINE ( PLENDIL), POTASSIUM, AND AMBIEN.  BEGIN TAKING METOPROLOL SUCCINATE 25MG  DAILY AND TRICAGRELOR (BRILINTA) 90MG  TAKE 2 TABLETS IN MORNING (LOADING DOSE FOR TOMORROW) 06/04/19  THEN TAKE 90MG  TWICE A DAY. START TAKING TRAZODONE 50MG  AT BEDTIME= 1 TABLET AT BEDTIME *If you need a refill on your cardiac medications before your next appointment, please call your pharmacy*  Lab Work: LIPID If you have labs (blood work) drawn today and your tests are completely normal, you will receive your results only by: Marland Kitchen MyChart Message (if you have MyChart) OR . A paper copy in the mail If you have any lab test that is abnormal or  we need to change your treatment, we will call you to review the results.  Follow-Up: At [facility], you and your health needs are our priority.  As part of our continuing mission to provide you with exceptional heart care, we have created designated Provider Care Teams.  These Care Teams include your primary Cardiologist (physician) and Advanced Practice Providers (APPs -  Physician Assistants and Nurse Practitioners) who all work together to provide you with the care you need, when you need it.  Your next appointment:  Friday 08/08/19 at 4:20 pm  The format for your next appointment:   In Person  Provider:   Eleonore Chiquito, MD  Other Instructions BEGIN 14 DAY ZIO MONITOR. WILL BE SENT TO YOUR HOME. A MONITOR TECH WILL CALL WITH INSTRUCTIONS OF HOW TO PLACE MONITOR.     Signed, Addison Naegeli. Audie Box, [place]  [address] [place], [place] [address] [phone]  06/03/2019 5:53 PM

## 2019-06-03 ENCOUNTER — Other Ambulatory Visit: Payer: Self-pay

## 2019-06-03 ENCOUNTER — Ambulatory Visit (INDEPENDENT_AMBULATORY_CARE_PROVIDER_SITE_OTHER): Payer: Managed Care, Other (non HMO) | Admitting: Cardiovascular Disease

## 2019-06-03 ENCOUNTER — Encounter: Payer: Self-pay | Admitting: Cardiovascular Disease

## 2019-06-03 VITALS — BP 112/75 | HR 70 | Ht 70.0 in | Wt 179.0 lb

## 2019-06-03 DIAGNOSIS — I10 Essential (primary) hypertension: Secondary | ICD-10-CM | POA: Diagnosis not present

## 2019-06-03 DIAGNOSIS — I2510 Atherosclerotic heart disease of native coronary artery without angina pectoris: Secondary | ICD-10-CM | POA: Diagnosis not present

## 2019-06-03 DIAGNOSIS — E782 Mixed hyperlipidemia: Secondary | ICD-10-CM | POA: Diagnosis not present

## 2019-06-03 DIAGNOSIS — I6389 Other cerebral infarction: Secondary | ICD-10-CM

## 2019-06-03 DIAGNOSIS — F5101 Primary insomnia: Secondary | ICD-10-CM

## 2019-06-03 MED ORDER — TICAGRELOR 90 MG PO TABS: 90.0000 mg | ORAL_TABLET | Freq: Two times a day (BID) | ORAL | 4 refills | Status: DC

## 2019-06-03 MED ORDER — TICAGRELOR 90 MG PO TABS: 180.0000 mg | ORAL_TABLET | Freq: Once | ORAL | 0 refills | Status: AC

## 2019-06-03 MED ORDER — METOPROLOL SUCCINATE ER 25 MG PO TB24: 25.0000 mg | ORAL_TABLET | Freq: Every day | ORAL | 3 refills | Status: DC

## 2019-06-03 MED ORDER — TRAZODONE HCL 50 MG PO TABS: 50.0000 mg | ORAL_TABLET | Freq: Every day | ORAL | 3 refills | Status: DC

## 2019-06-03 NOTE — Patient Instructions (Addendum)
Medication Instructions:  STOP TAKING THE PRASUGREL (EFFIENT), COUMADIN (WARFARIN), FELODIPINE ( PLENDIL), POTASSIUM, AND AMBIEN.  BEGIN TAKING METOPROLOL SUCCINATE 25MG  DAILY AND TRICAGRELOR (BRILINTA) 90MG  TAKE 2 TABLETS IN MORNING (LOADING DOSE FOR TOMORROW) 06/04/19  THEN TAKE 90MG  TWICE A DAY. START TAKING TRAZODONE 50MG  AT BEDTIME= 1 TABLET AT BEDTIME *If you need a refill on your cardiac medications before your next appointment, please call your pharmacy*  Lab Work: LIPID If you have labs (blood work) drawn today and your tests are completely normal, you will receive your results only by: MyChart Message (if you have MyChart) OR . A paper copy in the mail If you have any lab test that is abnormal or we need to change your treatment, we will call you to review the results.  Follow-Up: At [facility], you and your health needs are our priority.  As part of our continuing mission to provide you with exceptional heart care, we have created designated Provider Care Teams.  These Care Teams include your primary Cardiologist (physician) and Advanced Practice Providers (APPs -  Physician Assistants and Nurse Practitioners) who all work together to provide you with the care you need, when you need it.  Your next appointment:  Friday 08/08/19 at 4:20 pm  The format for your next appointment:   In Person  Provider:   Marland Kitchen, MD  Other Instructions BEGIN 14 DAY ZIO MONITOR. WILL BE SENT TO YOUR HOME. A MONITOR TECH WILL CALL WITH INSTRUCTIONS OF HOW TO PLACE MONITOR.

## 2019-06-08 NOTE — Progress Notes (Signed)
NEUROLOGY CONSULTATION NOTE  Jonathon Griffin MRN: [id number] DOB: December 20, 1955  Referring provider: Naoma Diener, MD Primary care provider: Carron Curie, MD  Reason for consult:  Stroke  HISTORY OF PRESENT ILLNESS: Jonathon Griffin is a 64 year old right-handed white male with coronary artery disease, hypertension, and depression who presents for recent stroke.  History supplemented by hospital records, cardiology note and his wife.  He was admitted to [facility] on 04/09/2019 for cardiac arrest secondary to acute STEMI.  He underwent stent and angioplasty of occluded right coronary artery.  His hospitalization was complicated by respiratory failure and septic shock related to aspiration pneumonia.  While hospitalized, he had bilateral embolic strokes.  MRI of brain without contrast on 04/16/2019 showed "multiple bilateral early subacute nonhemorrhagic infarcts" of the "periventricular and subcortical white matter bilaterally" primarily in the right frontal and parietal lobes", single focus in right occipital lobe and single focus in right cerebellar hemisphere.  Echocardiogram showed EF 55%.  He was unable to wean off of the ventilator and subsequently required a tracheostomy.Marland Kitchen  He was transferred to [facility] in [place] on 05/02/2019 where he was successfully decannulated and discharged to [facility] on 05/14/2019 where he was placed on Coumadin for the stroke.  There had not been any documented atrial fibrillation in the hospital or rehab records.  He was ventilated for about 2 to 2 1/2 weeks.  He followed up with outpatient cardiology, Dr. Flora Lipps earlier this month.  Most recent echocardiogram from 05/28/2019 showed EF 50-55% with no atrial level shunt detected.  As there has not been any documented atrial fibrillation or severe heart failure, he discontinued Coumadin and started on Brilinta in addition to ASA 81mg .  He has  ordered a 14-day ZIO patch to evaluate for atrial fibrillation.  Current medications:  ASA 81mg ; Brilinta; Lipitor 80mg ; Toprol-XL 25mg ; sertraline 25mg  daily; trazodone; clonazepam  Propofol, trach 2 weeks  PAST MEDICAL HISTORY: Past Medical History:  Diagnosis Date  . Acute on chronic respiratory failure with hypoxia (HCC)   . Acute ST elevation myocardial infarction (STEMI) (HCC)   . Anxiety   . Arthritis   . Aspiration pneumonia due to gastric secretions (HCC)   . Cardiac arrest (HCC)   . Coronary artery disease   . Depression   . Embolic stroke involving anterior cerebral artery (HCC)   . Hyperlipidemia   . Hypertension   . Obstructive sleep apnea   . Sleep apnea     PAST SURGICAL HISTORY: Past Surgical History:  Procedure Laterality Date  . bicep surgery    . CARDIAC CATHETERIZATION    . PEG PLACEMENT    . TRACHEOSTOMY      MEDICATIONS: Current Outpatient Medications on File Prior to Visit  Medication Sig Dispense Refill  . aspirin EC 81 MG tablet Take 81 mg by mouth daily.    atorvastatin (LIPITOR) 80 MG tablet Take 80 mg by mouth daily at 6 PM.    . clonazePAM (KLONOPIN) 0.5 MG tablet Take 0.25 mg by mouth 2 (two) times daily.    . famotidine (PEPCID) 20 MG tablet Take 20 mg by mouth 2 (two) times daily.    . metoprolol succinate (TOPROL-XL) 25 MG 24 hr tablet Take 1 tablet (25 mg total) by mouth daily. 90 tablet 3  . Multiple Vitamin (MULTIVITAMIN WITH MINERALS) TABS tablet Take 1 tablet by mouth daily.    . sertraline (ZOLOFT) 25 MG tablet Take 25 mg  by mouth daily.    . ticagrelor (BRILINTA) 90 MG TABS tablet Take 1 tablet (90 mg total) by mouth 2 (two) times daily. 180 tablet 4  . traZODone (DESYREL) 50 MG tablet Take 1 tablet (50 mg total) by mouth at bedtime. 90 tablet 3   No current facility-administered medications on file prior to visit.    ALLERGIES: Allergies  Allergen Reactions  . Oxycodone-Acetaminophen     REACTION: itching  . Wellbutrin  [Bupropion]     tremors    FAMILY HISTORY: Family History  Problem Relation Age of Onset  . Arthritis Mother   . Arthritis Sister     SOCIAL HISTORY: Social History   Socioeconomic History  . Marital status: Married    Spouse name: Not on file  . Number of children: Not on file  . Years of education: Not on file  . Highest education level: Not on file  Occupational History  . Occupation: welder  Tobacco Use  . Smoking status: Never Smoker  . Smokeless tobacco: Never Used  Substance and Sexual Activity  . Alcohol use: No  . Drug use: No  . Sexual activity: Yes  Other Topics Concern  . Not on file  Social History Narrative  . Not on file   Social Determinants of Health   Financial Resource Strain:   . Difficulty of Paying Living Expenses: Not on file  Food Insecurity:   . Worried About Programme researcher, broadcasting/film/video in the Last Year: Not on file  . Ran Out of Food in the Last Year: Not on file  Transportation Needs:   . Lack of Transportation (Medical): Not on file  . Lack of Transportation (Non-Medical): Not on file  Physical Activity:   . Days of Exercise per Week: Not on file  . Minutes of Exercise per Session: Not on file  Stress:   . Feeling of Stress : Not on file  Social Connections:   . Frequency of Communication with Friends and Family: Not on file  . Frequency of Social Gatherings with Friends and Family: Not on file  . Attends Religious Services: Not on file  . Active Member of Clubs or Organizations: Not on file  . Attends Banker Meetings: Not on file  . Marital Status: Not on file  Intimate Partner Violence:   . Fear of Current or Ex-Partner: Not on file  . Emotionally Abused: Not on file  . Physically Abused: Not on file  . Sexually Abused: Not on file    REVIEW OF SYSTEMS: Constitutional: No fevers, chills, or sweats, no generalized fatigue, change in appetite Eyes: No visual changes, double vision, eye pain Ear, nose and throat: No  hearing loss, ear pain, nasal congestion, sore throat Cardiovascular: No chest pain, palpitations Respiratory:  No shortness of breath at rest or with exertion, wheezes GastrointestinaI: No nausea, vomiting, diarrhea, abdominal pain, fecal incontinence Genitourinary:  No dysuria, urinary retention or frequency Musculoskeletal:  No neck pain, back pain Integumentary: No rash, pruritus, skin lesions Neurological: as above Psychiatric: No depression, insomnia, anxiety Endocrine: No palpitations, fatigue, diaphoresis, mood swings, change in appetite, change in weight, increased thirst Hematologic/Lymphatic:  No purpura, petechiae. Allergic/Immunologic: no itchy/runny eyes, nasal congestion, recent allergic reactions, rashes  PHYSICAL EXAM: Blood pressure 129/87, pulse 95, temperature 98.2 F (36.8 C), height 5\' 10"  (1.778 m), weight 181 lb (82.1 kg), SpO2 97 %. General: No acute distress.  Patient appears well-groomed.  Head:  Normocephalic/atraumatic Eyes:  fundi examined but not visualized  Neck: supple, no paraspinal tenderness, full range of motion Back: No paraspinal tenderness Heart: regular rate and rhythm Lungs: Clear to auscultation bilaterally. Vascular: No carotid bruits. Neurological Exam: Mental status: alert and oriented to person, place, and time, recent and remote memory intact, fund of knowledge intact, attention and concentration intact, speech fluent and not dysarthric, language intact. [place] Exam 06/09/2019  Weekday Correct 1  Current year 1  What state are we in? 1  Amount spent 1  Amount left 2  # of Animals 3  5 objects recall 4  Number series 2  Hour markers 2  Time correct 2  Placed X in triangle correctly 1  Largest Figure 1  Name of male 2  Date back to work 2  Type of work 2  State she lived in 2  Total score 29   Cranial nerves: CN I: not tested CN II: pupils equal, round and reactive to light, visual fields intact CN III,  IV, VI:  full range of motion, no nystagmus, no ptosis CN V: facial sensation intact CN VII: upper and lower face symmetric CN VIII: hearing intact CN IX, X: gag intact, uvula midline CN XI: sternocleidomastoid and trapezius muscles intact CN XII: tongue midline Bulk & Tone: normal, no fasciculations. Motor:  5/5 throughout  Sensation:  Pinprick and vibration sensation intact. Deep Tendon Reflexes:  2+ throughout, toes downgoing.  Finger to nose testing:  Without dysmetria.  Heel to shin:  Without dysmetria.  Gait:  Normal station and stride.  Able to turn and tandem walk. Romberg negative.  IMPRESSION: 1.  Acute STEMI secondary to RCA occlusion with hypoxia s/p STENT 2.  Bilateral hemispheric strokes, likely cardio-embolic secondary to #1 3.  Hypertension 4.  Hyperlipidemia   Agree with cardiology that ongoing anticoagulation is not indicated at this time.  Would instead continue antiplatelet therapy and pursue ongoing testing for cardiac source of emboli.  I recommended that he follow up with his eye doctor for visual field testing.  If no evidence of homonymous hemianopsia, then I see no reason why he cannot drive.  PLAN: 1.  ASA 81mg  daily (and Brilinta as per cardiology) 2.  Cardiac event monitor 3.  Atorvastatin (LDL goal less than 70) 4.  Continue blood pressure control 5.  Recommend Mediterranean diet 6.  Advised that he follow up with his eye doctor for visual field testing.  Requested that he have note faxed to me. 7.  Follow up in 4 months.  Thank you for allowing me to take part in the care of this patient.  Metta Clines, DO  CC:  Lew Dawes, MD

## 2019-06-09 ENCOUNTER — Other Ambulatory Visit: Payer: Self-pay

## 2019-06-09 ENCOUNTER — Inpatient Hospital Stay: Payer: Managed Care, Other (non HMO) | Admitting: Internal Medicine

## 2019-06-09 ENCOUNTER — Encounter: Payer: Self-pay | Admitting: Neurology

## 2019-06-09 ENCOUNTER — Ambulatory Visit (INDEPENDENT_AMBULATORY_CARE_PROVIDER_SITE_OTHER): Payer: Managed Care, Other (non HMO) | Admitting: Neurology

## 2019-06-09 VITALS — BP 129/87 | HR 95 | Temp 98.2°F | Ht 70.0 in | Wt 181.0 lb

## 2019-06-09 DIAGNOSIS — I10 Essential (primary) hypertension: Secondary | ICD-10-CM

## 2019-06-09 DIAGNOSIS — I213 ST elevation (STEMI) myocardial infarction of unspecified site: Secondary | ICD-10-CM | POA: Diagnosis not present

## 2019-06-09 DIAGNOSIS — I639 Cerebral infarction, unspecified: Secondary | ICD-10-CM | POA: Diagnosis not present

## 2019-06-09 DIAGNOSIS — J9621 Acute and chronic respiratory failure with hypoxia: Secondary | ICD-10-CM

## 2019-06-09 DIAGNOSIS — E785 Hyperlipidemia, unspecified: Secondary | ICD-10-CM

## 2019-06-09 LAB — LIPID PANEL
Chol/HDL Ratio: 3.1 ratio (ref 0.0–5.0)
Cholesterol, Total: 119 mg/dL (ref 100–199)
HDL: 39 mg/dL — ABNORMAL LOW (ref >39)
LDL Chol Calc (NIH): 56 mg/dL (ref 0–99)
Triglycerides: 140 mg/dL (ref 0–149)
VLDL Cholesterol Cal: 24 mg/dL (ref 5–40)

## 2019-06-09 MED ORDER — FAMOTIDINE 20 MG PO TABS: 20.0000 mg | ORAL_TABLET | Freq: Two times a day (BID) | ORAL | 2 refills | Status: DC

## 2019-06-09 MED ORDER — ATORVASTATIN CALCIUM 80 MG PO TABS: 80.0000 mg | ORAL_TABLET | Freq: Every day | ORAL | 2 refills | Status: DC

## 2019-06-09 NOTE — Patient Instructions (Signed)
1.  Agree with aspirin 81mg  daily (and Brilinta as per cardiology) 2.  Continue atorvastatin 3.  Follow Mediterranean diet (see below) 4.  Please follow up with your eye doctor for visual field testing.  If okay, then I see no reason why you cannot drive. 5.  Follow up in 4 months.   Mediterranean Diet A Mediterranean diet refers to food and lifestyle choices that are based on the traditions of countries located on the . This way of eating has been shown to help prevent certain conditions and improve outcomes for people who have chronic diseases, like kidney disease and heart disease. What are tips for following this plan? Lifestyle  Cook and eat meals together with your family, when possible.  Drink enough fluid to keep your urine clear or pale yellow.  Be physically active every day. This includes: ? Aerobic exercise like running or swimming. ? Leisure activities like gardening, walking, or housework.  Get 7-8 hours of sleep each night.  If recommended by your health care provider, drink red wine in moderation. This means 1 glass a day for nonpregnant women and 2 glasses a day for men. A glass of wine equals 5 oz (150 mL). Reading food labels   Check the serving size of packaged foods. For foods such as rice and pasta, the serving size refers to the amount of cooked product, not dry.  Check the total fat in packaged foods. Avoid foods that have saturated fat or trans fats.  Check the ingredients list for added sugars, such as corn syrup. Shopping  At the grocery store, buy most of your food from the areas near the walls of the store. This includes: ? Fresh fruits and vegetables (produce). ? Grains, beans, nuts, and seeds. Some of these may be available in unpackaged forms or large amounts (in bulk). ? Fresh seafood. ? Poultry and eggs. ? Low-fat dairy products.  Buy whole ingredients instead of prepackaged foods.  Buy fresh fruits and vegetables in-season  from local farmers markets.  Buy frozen fruits and vegetables in resealable bags.  If you do not have access to quality fresh seafood, buy precooked frozen shrimp or canned fish, such as tuna, salmon, or sardines.  Buy small amounts of raw or cooked vegetables, salads, or olives from the deli or salad bar at your store.  Stock your pantry so you always have certain foods on hand, such as olive oil, canned tuna, canned tomatoes, rice, pasta, and beans. Cooking  Cook foods with extra-virgin olive oil instead of using butter or other vegetable oils.  Have meat as a side dish, and have vegetables or grains as your main dish. This means having meat in small portions or adding small amounts of meat to foods like pasta or stew.  Use beans or vegetables instead of meat in common dishes like chili or lasagna.  Experiment with different cooking methods. Try roasting or broiling vegetables instead of steaming or sauteing them.  Add frozen vegetables to soups, stews, pasta, or rice.  Add nuts or seeds for added healthy fat at each meal. You can add these to yogurt, salads, or vegetable dishes.  Marinate fish or vegetables using olive oil, lemon juice, garlic, and fresh herbs. Meal planning   Plan to eat 1 vegetarian meal one day each week. Try to work up to 2 vegetarian meals, if possible.  Eat seafood 2 or more times a week.  Have healthy snacks readily available, such as: ? Vegetable sticks with hummus. ?  Greek yogurt. ? Fruit and nut trail mix.  Eat balanced meals throughout the week. This includes: ? Fruit: 2-3 servings a day ? Vegetables: 4-5 servings a day ? Low-fat dairy: 2 servings a day ? Fish, poultry, or lean meat: 1 serving a day ? Beans and legumes: 2 or more servings a week ? Nuts and seeds: 1-2 servings a day ? Whole grains: 6-8 servings a day ? Extra-virgin olive oil: 3-4 servings a day  Limit red meat and sweets to only a few servings a month What are my food  choices?  Mediterranean diet ? Recommended  Grains: Whole-grain pasta. Brown rice. Bulgar wheat. Polenta. Couscous. Whole-wheat bread. [place].  Vegetables: Artichokes. Beets. Broccoli. Cabbage. Carrots. Eggplant. Green beans. Chard. Kale. Spinach. Onions. Leeks. Peas. Squash. Tomatoes. Peppers. Radishes.  Fruits: Apples. Apricots. Avocado. Berries. Bananas. Cherries. Dates. Figs. Grapes. Lemons. Melon. Oranges. Peaches. Plums. Pomegranate.  Meats and other protein foods: Beans. Almonds. Sunflower seeds. Pine nuts. Peanuts. [place]. Salmon. Scallops. Shrimp. [place]. Tilapia. Clams. Oysters. Eggs.  Dairy: Low-fat milk. Cheese. Greek yogurt.  Beverages: Water. Red wine. Herbal tea.  Fats and oils: Extra virgin olive oil. Avocado oil. Grape seed oil.  Sweets and desserts: [place] yogurt with honey. Baked apples. Poached pears. Trail mix.  Seasoning and other foods: Basil. Cilantro. Coriander. Cumin. Mint. Parsley. Sage. Rosemary. Tarragon. Garlic. Oregano. Thyme. Pepper. Balsalmic vinegar. Tahini. Hummus. Tomato sauce. Olives. Mushrooms. ? Limit these  Grains: Prepackaged pasta or rice dishes. Prepackaged cereal with added sugar.  Vegetables: Deep fried potatoes (french fries).  Fruits: Fruit canned in syrup.  Meats and other protein foods: Beef. Pork. Lamb. Poultry with skin. Hot dogs. Berniece Salines.  Dairy: Ice cream. Sour cream. Whole milk.  Beverages: Juice. Sugar-sweetened soft drinks. Beer. Liquor and spirits.  Fats and oils: Butter. Canola oil. Vegetable oil. Beef fat (tallow). Lard.  Sweets and desserts: Cookies. Cakes. Pies. Candy.  Seasoning and other foods: Mayonnaise. Premade sauces and marinades. The items listed may not be a complete list. Talk with your dietitian about what dietary choices are right for you. Summary  The Mediterranean diet includes both food and lifestyle choices.  Eat a variety of fresh fruits and vegetables, beans, nuts, seeds, and whole  grains.  Limit the amount of red meat and sweets that you eat.  Talk with your health care provider about whether it is safe for you to drink red wine in moderation. This means 1 glass a day for nonpregnant women and 2 glasses a day for men. A glass of wine equals 5 oz (150 mL). This information is not intended to replace advice given to you by your health care provider. Make sure you discuss any questions you have with your health care provider. Document Revised: 01/06/2016 Document Reviewed: 12/30/2015 Elsevier Patient Education  [place].

## 2019-06-11 ENCOUNTER — Telehealth: Payer: Self-pay | Admitting: Internal Medicine

## 2019-06-11 NOTE — Telephone Encounter (Signed)
Copied from CRM [id number]. Topic: General - Other >> Jun 11, 2019  2:05 PM Jaquita Rector A wrote: Reason for CRM: Fleet Contras Case manager with [facility] called to inform Dr Posey Rea that she is working with the patient closely and that if there is any help needed for the patient please give her a call at Ph# [phone] Ext# [phone]

## 2019-06-12 NOTE — Telephone Encounter (Signed)
FYI

## 2019-06-13 ENCOUNTER — Ambulatory Visit: Payer: Managed Care, Other (non HMO) | Admitting: Cardiology

## 2019-06-13 ENCOUNTER — Other Ambulatory Visit: Payer: Self-pay

## 2019-06-16 ENCOUNTER — Other Ambulatory Visit: Payer: Self-pay

## 2019-06-19 ENCOUNTER — Telehealth: Payer: Self-pay | Admitting: Cardiovascular Disease

## 2019-06-19 NOTE — Telephone Encounter (Signed)
Will route to MD to advise on blood thinner. Thank you!

## 2019-06-19 NOTE — Telephone Encounter (Signed)
Called patient wife, gave message from MD.  Patient wife verbalized understanding.

## 2019-06-19 NOTE — Telephone Encounter (Signed)
Patient's wife states patient is having a feeding tube removal and she is inquiring whether patient should hold any blood thinner medications prior to the removal or not. Please call and advise.

## 2019-06-19 NOTE — Telephone Encounter (Signed)
None of his medications should be held. He will continue all blood thinners through removal.   Gerri Spore T. Flora Lipps, MD [facility]  [address] [place], [place] [address] [phone]  1:07 PM

## 2019-06-25 ENCOUNTER — Ambulatory Visit: Payer: Managed Care, Other (non HMO) | Admitting: Internal Medicine

## 2019-06-25 ENCOUNTER — Encounter: Payer: Self-pay | Admitting: Internal Medicine

## 2019-06-25 ENCOUNTER — Other Ambulatory Visit: Payer: Self-pay

## 2019-06-25 DIAGNOSIS — I2510 Atherosclerotic heart disease of native coronary artery without angina pectoris: Secondary | ICD-10-CM

## 2019-06-25 DIAGNOSIS — I213 ST elevation (STEMI) myocardial infarction of unspecified site: Secondary | ICD-10-CM | POA: Diagnosis not present

## 2019-06-25 DIAGNOSIS — I63429 Cerebral infarction due to embolism of unspecified anterior cerebral artery: Secondary | ICD-10-CM

## 2019-06-25 DIAGNOSIS — I2583 Coronary atherosclerosis due to lipid rich plaque: Secondary | ICD-10-CM | POA: Diagnosis not present

## 2019-06-25 MED ORDER — TRAZODONE HCL 50 MG PO TABS: 50.0000 mg | ORAL_TABLET | Freq: Every evening | ORAL | 1 refills | Status: DC | PRN

## 2019-06-25 MED ORDER — VITAMIN D3 50 MCG (2000 UT) PO CAPS: 2000.0000 [IU] | ORAL_CAPSULE | Freq: Every day | ORAL | 3 refills | Status: AC

## 2019-06-25 MED ORDER — CLONAZEPAM 0.5 MG PO TABS: 0.2500 mg | ORAL_TABLET | Freq: Two times a day (BID) | ORAL | 1 refills | Status: DC | PRN

## 2019-06-25 MED ORDER — SERTRALINE HCL 25 MG PO TABS: 25.0000 mg | ORAL_TABLET | Freq: Every day | ORAL | 3 refills | Status: DC

## 2019-06-25 NOTE — Progress Notes (Signed)
Subjective:  Patient ID: Jonathon Griffin, male    DOB: [birth year]  Age: 64 y.o. MRN: [id number]  CC: No chief complaint on file.   HPI Jonathon Griffin presents for CVA, MI  Per hx (Dr Jonathon Griffin): "He was admitted to [facility] on 04/09/2019 for cardiac arrest secondary to acute STEMI.  He underwent stent and angioplasty of occluded right coronary artery.  His hospitalization was complicated by respiratory failure and septic shock related to aspiration pneumonia.  While hospitalized, he had bilateral embolic strokes.  MRI of brain without contrast on 04/16/2019 showed "multiple bilateral early subacute nonhemorrhagic infarcts" of the "periventricular and subcortical white matter bilaterally" primarily in the right frontal and parietal lobes", single focus in right occipital lobe and single focus in right cerebellar hemisphere.  Echocardiogram showed EF 55%.  He was unable to wean off of the ventilator and subsequently required a tracheostomy.Marland Kitchen  He was transferred to [facility] in [place] on 05/02/2019 where he was successfully decannulated and discharged to [facility] on 05/14/2019 where he was placed on Coumadin for the stroke.  There had not been any documented atrial fibrillation in the hospital or rehab records.  He was ventilated for about 2 to 2 1/2 weeks.  He followed up with outpatient cardiology, Dr. Flora Griffin earlier this month.  Most recent echocardiogram from 05/28/2019 showed EF 50-55% with no atrial level shunt detected.  As there has not been any documented atrial fibrillation or severe heart failure, he discontinued Coumadin and started on Brilinta in addition to ASA 81mg .  He has ordered a 14-day ZIO patch to evaluate for atrial fibrillation."    Outpatient Medications Prior to Visit  Medication Sig Dispense Refill   aspirin EC 81 MG tablet Take 81 mg by mouth daily.     atorvastatin (LIPITOR) 80 MG tablet Take 1  tablet (80 mg total) by mouth daily at 6 PM. 90 tablet 2   clonazePAM (KLONOPIN) 0.5 MG tablet Take 0.25 mg by mouth 2 (two) times daily.     famotidine (PEPCID) 20 MG tablet Take 1 tablet (20 mg total) by mouth 2 (two) times daily. 90 tablet 2   metoprolol succinate (TOPROL-XL) 25 MG 24 hr tablet Take 1 tablet (25 mg total) by mouth daily. 90 tablet 3   Multiple Vitamin (MULTIVITAMIN WITH MINERALS) TABS tablet Take 1 tablet by mouth daily.     sertraline (ZOLOFT) 25 MG tablet Take 25 mg by mouth daily.     ticagrelor (BRILINTA) 90 MG TABS tablet Take 1 tablet (90 mg total) by mouth 2 (two) times daily. 180 tablet 4   traMADol (ULTRAM) 50 MG tablet Take by mouth every 6 (six) hours as needed.     traZODone (DESYREL) 50 MG tablet Take 1 tablet (50 mg total) by mouth at bedtime. 90 tablet 3   No facility-administered medications prior to visit.    ROS: Review of Systems  Objective:  BP 122/84 (BP Location: Left Arm, Patient Position: Sitting, Cuff Size: Normal)   Pulse 84   Temp 98.3 F (36.8 C) (Oral)   Ht 5\' 10"  (1.778 m)   Wt 180 lb (81.6 kg)   SpO2 98%   BMI 25.83 kg/m   BP Readings from Last 3 Encounters:  06/25/19 122/84  06/09/19 129/87  06/03/19 112/75    Wt Readings from Last 3 Encounters:  06/25/19 180 lb (81.6 kg)  06/09/19 181 lb (82.1 kg)  06/03/19 179 lb (81.2 kg)  Physical Exam  Lab Results  Component Value Date   WBC 10.2 05/09/2019   HGB 13.8 05/09/2019   HCT 41.9 05/09/2019   PLT 312 05/09/2019   GLUCOSE 129 (H) 05/09/2019   CHOL 119 06/09/2019   TRIG 140 06/09/2019   HDL 39 (L) 06/09/2019   LDLDIRECT 158.0 02/12/2019   LDLCALC 56 06/09/2019   ALT 75 (H) 05/02/2019   AST 38 05/02/2019   NA 136 05/09/2019   K 4.1 05/09/2019   CL 100 05/09/2019   CREATININE 0.84 05/09/2019   BUN 21 05/09/2019   CO2 25 05/09/2019   TSH 2.20 02/12/2019   PSA 0.94 02/12/2019   INR 2.3 (H) 05/14/2019    ECHOCARDIOGRAM COMPLETE  Result Date: 05/28/2019    ECHOCARDIOGRAM REPORT   Patient Name:   Jonathon Griffin Date of Exam: 05/28/2019 Medical Rec #:  [id number]       Height:       70.0 in Accession #:    [id number]      Weight:       178.0 lb Date of Birth:  [birth year]       BSA:          1.99 m Patient Age:    63 years        BP:           130/79 mmHg Patient Gender: M               HR:           82 bpm. [facility] Procedure: 2D Echo, Cardiac Doppler and Color Doppler Indications:    CAD  History:        Patient has no prior history of Echocardiogram examinations.                 Risk Factors:Hypertension and Dyslipidemia. H/O Acute ST                 elevation myocardial infarction (STEMI) ,Acute on chronic                 respiratory failure with hypoxia,Alcohol abuse, in                 remission,Cardiac arrest.  Sonographer:    Celesta Gentile RCS Referring Phys: [id number] Jonathon Griffin IMPRESSIONS  1. Left ventricular ejection fraction, by visual estimation, is 50 to 55%. The left ventricle has low normal function. There is no left ventricular hypertrophy.  2. Basal inferoseptal segment, basal inferolateral segment, basal inferior segment, and mid inferolateral segment are abnormal.  3. Left ventricular diastolic parameters are indeterminate.  4. The left ventricle demonstrates regional wall motion abnormalities.  5. Global right ventricle has low normal systolic function.The right ventricular size is normal. No increase in right ventricular wall thickness.  6. Left atrial size was normal.  7. Right atrial size was normal.  8. The mitral valve is grossly normal. Trivial mitral valve regurgitation.  9. The tricuspid valve is grossly normal. 10. The aortic valve is tricuspid. Aortic valve regurgitation is trivial. 11. The pulmonic valve was grossly normal. Pulmonic valve regurgitation is trivial. 12. TR signal is inadequate for assessing pulmonary artery systolic pressure. 13. The inferior vena cava is normal in size with greater than 50%  respiratory variability, suggesting right atrial pressure of 3 mmHg. FINDINGS  Left Ventricle: Left ventricular ejection fraction, by visual estimation, is 50 to 55%. The left ventricle has low normal function. The left ventricle  demonstrates regional wall motion abnormalities. The left ventricular internal cavity size was the left ventricle is normal in size. There is no left ventricular hypertrophy. Left ventricular diastolic parameters are indeterminate.  LV Wall Scoring: The basal inferoseptal segment, basal inferolateral segment, and basal inferior segment are akinetic. The mid inferolateral segment is hypokinetic. All remaining scored segments are normal. Right Ventricle: The right ventricular size is normal. No increase in right ventricular wall thickness. Global RV systolic function is has low normal systolic function. Left Atrium: Left atrial size was normal in size. Right Atrium: Right atrial size was normal in size Pericardium: There is no evidence of pericardial effusion. Mitral Valve: The mitral valve is grossly normal. Trivial mitral valve regurgitation. Tricuspid Valve: The tricuspid valve is grossly normal. Tricuspid valve regurgitation is trivial. Aortic Valve: The aortic valve is tricuspid. Aortic valve regurgitation is trivial. Mild aortic valve annular calcification. Pulmonic Valve: The pulmonic valve was grossly normal. Pulmonic valve regurgitation is trivial. Pulmonic regurgitation is trivial. Aorta: The aortic root is normal in size and structure. Venous: The inferior vena cava is normal in size with greater than 50% respiratory variability, suggesting right atrial pressure of 3 mmHg. IAS/Shunts: No atrial level shunt detected by color flow Doppler.  LEFT VENTRICLE PLAX 2D LVIDd:         4.95 cm       Diastology LVIDs:         3.47 cm       LV e' lateral:   7.72 cm/s LV PW:         1.01 cm       LV E/e' lateral: 6.5 LV IVS:        0.85 cm       LV e' medial:    8.70 cm/s LVOT diam:     2.10 cm        LV E/e' medial:  5.8 LV SV:         66 ml LV SV Index:   32.73 LVOT Area:     3.46 cm  LV Volumes (MOD) LV area d, A2C:    30.90 cm LV area d, A4C:    32.80 cm LV area s, A2C:    17.50 cm LV area s, A4C:    19.90 cm LV major d, A2C:   8.45 cm LV major d, A4C:   7.98 cm LV major s, A2C:   7.05 cm LV major s, A4C:   7.16 cm LV vol d, MOD A2C: 95.2 ml LV vol d, MOD A4C: 111.0 ml LV vol s, MOD A2C: 38.4 ml LV vol s, MOD A4C: 46.6 ml LV SV MOD A2C:     56.8 ml LV SV MOD A4C:     111.0 ml LV SV MOD BP:      63.2 ml RIGHT VENTRICLE RV S prime:     11.70 cm/s TAPSE (M-mode): 1.5 cm LEFT ATRIUM             Index       RIGHT ATRIUM           Index LA diam:        2.60 cm 1.31 cm/m  RA Area:     15.40 cm LA Vol (A2C):   52.8 ml 26.58 ml/m RA Volume:   43.00 ml  21.65 ml/m LA Vol (A4C):   32.1 ml 16.16 ml/m LA Biplane Vol: 41.7 ml 20.99 ml/m  AORTIC VALVE LVOT Vmax:   128.00 cm/s LVOT Vmean:  75.900 cm/s LVOT VTI:    0.199 m  AORTA Ao Root diam: 3.30 cm MITRAL VALVE MV Area (PHT): 2.73 cm             SHUNTS MV PHT:        80.62 msec           Systemic VTI:  0.20 m MV Decel Time: 278 msec             Systemic Diam: 2.10 cm MV E velocity: 50.30 cm/s 103 cm/s MV A velocity: 80.60 cm/s 70.3 cm/s MV E/A ratio:  0.62       1.5  Rozann Lesches MD Electronically signed by Rozann Lesches MD Signature Date/Time: 05/28/2019/4:40:02 PM    Final     Assessment & Plan:     Follow-up: No follow-ups on file.  Walker Kehr, MD

## 2019-06-25 NOTE — Assessment & Plan Note (Addendum)
Obtain records Status post cardiac arrest, MI On Lipitor, aspirin, metoprolol, Brilinta

## 2019-07-09 ENCOUNTER — Telehealth: Payer: Self-pay

## 2019-07-09 NOTE — Telephone Encounter (Signed)
New message    The patient has a complete case management program.   Education was addressed.

## 2019-07-10 NOTE — Telephone Encounter (Signed)
noted 

## 2019-07-27 NOTE — Progress Notes (Signed)
Cardiology Office Note:   Date:  07/28/2019  NAME:  Jonathon Griffin    MRN: [id number] DOB:  1955/10/02   PCP:  Tresa Garter, MD  Cardiologist:  Reatha Harps, MD   Referring MD: Carron Curie, MD   Chief Complaint  Patient presents with  . Coronary Artery Disease   History of Present Illness:   Jonathon Griffin is a 64 y.o. male with a hx of inferior STEMI (VF arrest), stroke, HLD who presents for follow-up of CAD. Switched to brillinta due to strokes from prasugrel. Evaluated by neurology. No contraindication from driving. Was placed on coumadin in rehab. We stopped this. Awaiting 14 day zio.   He reports he is doing well since her last visit.  He has been cleared to drive by neurology.  He reports he started exercising the gym and going Monday Wednesday Fridays.  He exercises up to 1 hour and this includes lifting weights and walking on a treadmill.  He reports no shortness of breath or chest pain.  He does get fatigue but states this is improving.  He reports his PEG tube is come out.  He reports overall he is doing much better and ready to go back to work.  We have a plan for him to go back to work on 08/08/2019.  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  I think this will be okay for now.  We will see how it goes.   Problem List 1. Cardiac Arrest 04/06/2019 2/2 inferior STEMI [facility]) -s/p PCI to RCA -course c/b aspiration PNA, acute liver injury, resp failure requiring trach, protein deficiency requiring PEG 2. Multiple strokes (subcortical R occipital lobe/R cerebellar) -in setting of cardiac arrest  3. Hyperlipidemia  -T chol 119, HDL 39, LDL 56, TG 140  Past Medical History: Past Medical History:  Diagnosis Date  . Acute on chronic respiratory failure with hypoxia (HCC)   . Acute ST elevation myocardial infarction (STEMI) (HCC)   . Anxiety   . Arthritis   . Aspiration pneumonia due to gastric secretions (HCC)   . Cardiac arrest  (HCC)   . Coronary artery disease   . Depression   . Embolic stroke involving anterior cerebral artery (HCC)   . Hyperlipidemia   . Hypertension   . Obstructive sleep apnea   . Sleep apnea     Past Surgical History: Past Surgical History:  Procedure Laterality Date  . bicep surgery    . CARDIAC CATHETERIZATION    . PEG PLACEMENT    . TRACHEOSTOMY      Current Medications: Current Meds  Medication Sig  . aspirin EC 81 MG tablet Take 1 tablet (81 mg total) by mouth daily.  Marland Kitchen atorvastatin (LIPITOR) 80 MG tablet Take 1 tablet (80 mg total) by mouth daily at 6 PM.  . Cholecalciferol (VITAMIN D3) 50 MCG (2000 UT) capsule Take 1 capsule (2,000 Units total) by mouth daily.  . clonazePAM (KLONOPIN) 0.5 MG tablet Take 0.5 tablets (0.25 mg total) by mouth 2 (two) times daily as needed for anxiety.  . famotidine (PEPCID) 20 MG tablet Take 1 tablet (20 mg total) by mouth 2 (two) times daily.  . metoprolol succinate (TOPROL-XL) 25 MG 24 hr tablet Take 1 tablet (25 mg total) by mouth daily.  . Multiple Vitamin (MULTIVITAMIN WITH MINERALS) TABS tablet Take 1 tablet by mouth daily.  . sertraline (ZOLOFT) 25 MG tablet Take 1 tablet (25 mg total)  by mouth daily.  . ticagrelor (BRILINTA) 90 MG TABS tablet Take 1 tablet (90 mg total) by mouth 2 (two) times daily.  . traMADol (ULTRAM) 50 MG tablet Take by mouth every 6 (six) hours as needed.  . traZODone (DESYREL) 50 MG tablet Take 1-2 tablets (50-100 mg total) by mouth at bedtime as needed for sleep.  . [DISCONTINUED] aspirin EC 81 MG tablet Take 81 mg by mouth daily.  . [DISCONTINUED] atorvastatin (LIPITOR) 80 MG tablet Take 1 tablet (80 mg total) by mouth daily at 6 PM.  . [DISCONTINUED] metoprolol succinate (TOPROL-XL) 25 MG 24 hr tablet Take 1 tablet (25 mg total) by mouth daily.  . [DISCONTINUED] ticagrelor (BRILINTA) 90 MG TABS tablet Take 1 tablet (90 mg total) by mouth 2 (two) times daily.     Allergies:    Oxycodone-acetaminophen and  Wellbutrin [bupropion]   Social History: Social History   Socioeconomic History  . Marital status: Married    Spouse name: Not on file  . Number of children: Not on file  . Years of education: Not on file  . Highest education level: Not on file  Occupational History  . Occupation: welder  Tobacco Use  . Smoking status: Never Smoker  . Smokeless tobacco: Never Used  Substance and Sexual Activity  . Alcohol use: No  . Drug use: No  . Sexual activity: Yes  Other Topics Concern  . Not on file  Social History Narrative  . Not on file   Social Determinants of Health   Financial Resource Strain:   . Difficulty of Paying Living Expenses: Not on file  Food Insecurity:   . Worried About Programme researcher, broadcasting/film/video in the Last Year: Not on file  . Ran Out of Food in the Last Year: Not on file  Transportation Needs:   . Lack of Transportation (Medical): Not on file  . Lack of Transportation (Non-Medical): Not on file  Physical Activity:   . Days of Exercise per Week: Not on file  . Minutes of Exercise per Session: Not on file  Stress:   . Feeling of Stress : Not on file  Social Connections:   . Frequency of Communication with Friends and Family: Not on file  . Frequency of Social Gatherings with Friends and Family: Not on file  . Attends Religious Services: Not on file  . Active Member of Clubs or Organizations: Not on file  . Attends Banker Meetings: Not on file  . Marital Status: Not on file     Family History: The patient's family history includes Arthritis in his mother and sister.  ROS:   All other ROS reviewed and negative. Pertinent positives noted in the HPI.     EKGs/Labs/Other Studies Reviewed:   The following studies were personally reviewed by me today:  TTE 05/28/2019 1. Left ventricular ejection fraction, by visual estimation, is 50 to  55%. The left ventricle has low normal function. There is no left  ventricular hypertrophy.  2. Basal  inferoseptal segment, basal inferolateral segment, basal  inferior segment, and mid inferolateral segment are abnormal.  3. Left ventricular diastolic parameters are indeterminate.  4. The left ventricle demonstrates regional wall motion abnormalities.  5. Global right ventricle has low normal systolic function.The right  ventricular size is normal. No increase in right ventricular wall  thickness.  6. Left atrial size was normal.  7. Right atrial size was normal.  8. The mitral valve is grossly normal. Trivial mitral valve  regurgitation.  9. The tricuspid valve is grossly normal.  10. The aortic valve is tricuspid. Aortic valve regurgitation is trivial.  11. The pulmonic valve was grossly normal. Pulmonic valve regurgitation is  trivial.  12. TR signal is inadequate for assessing pulmonary artery systolic  pressure.  13. The inferior vena cava is normal in size with greater than 50%  respiratory variability, suggesting right atrial pressure of 3 mmHg.   Recent Labs: 02/12/2019: TSH 2.20 05/02/2019: ALT 75; Magnesium 1.9 05/09/2019: BUN 21; Creatinine, Ser 0.84; Hemoglobin 13.8; Platelets 312; Potassium 4.1; Sodium 136   Recent Lipid Panel    Component Value Date/Time   CHOL 119 06/09/2019 1021   TRIG 140 06/09/2019 1021   HDL 39 (L) 06/09/2019 1021   CHOLHDL 3.1 06/09/2019 1021   CHOLHDL 6 02/12/2019 1643   VLDL 64.2 (H) 02/12/2019 1643   LDLCALC 56 06/09/2019 1021   LDLDIRECT 158.0 02/12/2019 1643    Physical Exam:   VS:  BP 122/84   Pulse 75   Ht 5\' 10"  (1.778 m)   Wt 184 lb 12.8 oz (83.8 kg)   SpO2 100%   BMI 26.52 kg/m    Wt Readings from Last 3 Encounters:  07/28/19 184 lb 12.8 oz (83.8 kg)  06/25/19 180 lb (81.6 kg)  06/09/19 181 lb (82.1 kg)    General: Well nourished, well developed, in no acute distress Heart: Atraumatic, normal size  Eyes: PEERLA, EOMI  Neck: Supple, no JVD Endocrine: No thryomegaly Cardiac: Normal S1, S2; RRR; no murmurs,  rubs, or gallops Lungs: Clear to auscultation bilaterally, no wheezing, rhonchi or rales  Abd: Soft, nontender, no hepatomegaly  Ext: No edema, pulses 2+ Musculoskeletal: No deformities, BUE and BLE strength normal and equal Skin: Warm and dry, no rashes   euro: Alert and oriented to person, place, time, and situation, CNII-XII grossly intact, no focal deficits  Psych: Normal mood and affect   ASSESSMENT:   YENG PERZ is a 64 y.o. male who presents for the following: 1. Coronary artery disease involving native coronary artery of native heart without angina pectoris   2. Cardiac arrest (HCC)   3. Mixed hyperlipidemia     PLAN:   1. Coronary artery disease involving native coronary artery of native heart without angina pectoris 2. Cardiac arrest (HCC) -VF arrest in the setting of RCA STEMI 04/06/2019.  Had complicated course requiring trach and PEG.  All these have been removed.  He completed rehabilitation. -We switched him to Brilinta given the stroke he had in the hospital.  The stroke was likely in the setting of cardiac arrest.  There was no definitive atrial fibrillation on monitor.  He is tolerating Brilinta well. -He will remain on DAPT through January 2021.  He was agreed with this plan. -He will continue beta-blocker therapy with metoprolol succinate. -Blood pressure and cholesterol acceptable.  Most recent LDL was 56 -I have cleared him to go back to work on 08/08/2019 -He will continue high intensity statin Lipitor 80 mg daily  3. Mixed hyperlipidemia -LDL at goal.  Continue Lipitor 80 mg daily.  4. History of Stroke -He had multiple Bolick strokes while in the hospital for his cardiac arrest in November 2020 in [place].  This is attributed to his cardiac arrest.  We will complete a work-up with a 14-day Zio patch.  It will be prudent to exclude atrial fibrillation.  Disposition: Return in about 6 months (around 01/28/2020).  Medication Adjustments/Labs and  Tests Ordered:  Current medicines are reviewed at length with the patient today.  Concerns regarding medicines are outlined above.  Orders Placed This Encounter  Procedures  . LONG TERM MONITOR (3-14 DAYS)   Meds ordered this encounter  Medications  . aspirin EC 81 MG tablet    Sig: Take 1 tablet (81 mg total) by mouth daily.    Dispense:  90 tablet    Refill:  3  . atorvastatin (LIPITOR) 80 MG tablet    Sig: Take 1 tablet (80 mg total) by mouth daily at 6 PM.    Dispense:  90 tablet    Refill:  3  . metoprolol succinate (TOPROL-XL) 25 MG 24 hr tablet    Sig: Take 1 tablet (25 mg total) by mouth daily.    Dispense:  90 tablet    Refill:  3  . ticagrelor (BRILINTA) 90 MG TABS tablet    Sig: Take 1 tablet (90 mg total) by mouth 2 (two) times daily.    Dispense:  180 tablet    Refill:  3    Patient Instructions  Medication Instructions:  The current medical regimen is effective;  continue present plan and medications.  *If you need a refill on your cardiac medications before your next appointment, please call your pharmacy*  Testing/Procedures: Your physician has recommended that you wear a 14 DAY ZIO-PATCH monitor. The Zio patch cardiac monitor continuously records heart rhythm data for up to 14 days, this is for patients being evaluated for multiple types heart rhythms. For the first 24 hours post application, please avoid getting the Zio monitor wet in the shower or by excessive sweating during exercise. After that, feel free to carry on with regular activities. Keep soaps and lotions away from the ZIO XT Patch.  This will be mailed to you, please expect 7-10 days to receive.         Follow-Up: At [facility], you and your health needs are our priority.  As part of our continuing mission to provide you with exceptional heart care, we have created designated Provider Care Teams.  These Care Teams include your primary Cardiologist (physician) and Advanced Practice Providers  (APPs -  Physician Assistants and Nurse Practitioners) who all work together to provide you with the care you need, when you need it.  We recommend signing up for the patient portal called "MyChart".  Sign up information is provided on this After Visit Summary.  MyChart is used to connect with patients for Virtual Visits (Telemedicine).  Patients are able to view lab/test results, encounter notes, upcoming appointments, etc.  Non-urgent messages can be sent to your provider as well.   To learn more about what you can do with MyChart, go to [email or web address].    Your next appointment:   6 month(s)  The format for your next appointment:   In Person  Provider:   Eleonore Chiquito, MD        Time Spent with Patient: I have spent a total of 35 minutes with patient reviewing hospital notes, telemetry, EKGs, labs and examining the patient as well as establishing an assessment and plan that was discussed with the patient.  > 50% of time was spent in direct patient care.  Signed, Addison Naegeli. Audie Box, Media  [address] [place], [place] [address] [phone]  07/28/2019 12:46 PM

## 2019-07-28 ENCOUNTER — Ambulatory Visit (INDEPENDENT_AMBULATORY_CARE_PROVIDER_SITE_OTHER): Payer: Managed Care, Other (non HMO) | Admitting: Cardiovascular Disease

## 2019-07-28 ENCOUNTER — Other Ambulatory Visit: Payer: Self-pay

## 2019-07-28 ENCOUNTER — Telehealth: Payer: Self-pay | Admitting: *Deleted

## 2019-07-28 ENCOUNTER — Encounter: Payer: Self-pay | Admitting: Cardiovascular Disease

## 2019-07-28 VITALS — BP 122/84 | HR 75 | Ht 70.0 in | Wt 184.8 lb

## 2019-07-28 DIAGNOSIS — E782 Mixed hyperlipidemia: Secondary | ICD-10-CM | POA: Diagnosis not present

## 2019-07-28 DIAGNOSIS — I6389 Other cerebral infarction: Secondary | ICD-10-CM | POA: Diagnosis not present

## 2019-07-28 DIAGNOSIS — I2510 Atherosclerotic heart disease of native coronary artery without angina pectoris: Secondary | ICD-10-CM | POA: Diagnosis not present

## 2019-07-28 DIAGNOSIS — I469 Cardiac arrest, cause unspecified: Secondary | ICD-10-CM

## 2019-07-28 MED ORDER — ASPIRIN EC 81 MG PO TBEC: 81.0000 mg | DELAYED_RELEASE_TABLET | Freq: Every day | ORAL | 3 refills | Status: AC

## 2019-07-28 MED ORDER — METOPROLOL SUCCINATE ER 25 MG PO TB24: 25.0000 mg | ORAL_TABLET | Freq: Every day | ORAL | 3 refills | Status: DC

## 2019-07-28 MED ORDER — TICAGRELOR 90 MG PO TABS: 90.0000 mg | ORAL_TABLET | Freq: Two times a day (BID) | ORAL | 3 refills | Status: DC

## 2019-07-28 MED ORDER — ATORVASTATIN CALCIUM 80 MG PO TABS: 80.0000 mg | ORAL_TABLET | Freq: Every day | ORAL | 3 refills | Status: DC

## 2019-07-28 NOTE — Telephone Encounter (Signed)
Patient enrolled for [employer] to mail a 14 day ZIO XT long term holter monitor to his home address.  Instructions reviewed briefly as they are included in the monitor kit.

## 2019-07-28 NOTE — Patient Instructions (Signed)
Medication Instructions:  The current medical regimen is effective;  continue present plan and medications.  *If you need a refill on your cardiac medications before your next appointment, please call your pharmacy*  Testing/Procedures: Your physician has recommended that you wear a 14 DAY ZIO-PATCH monitor. The Zio patch cardiac monitor continuously records heart rhythm data for up to 14 days, this is for patients being evaluated for multiple types heart rhythms. For the first 24 hours post application, please avoid getting the Zio monitor wet in the shower or by excessive sweating during exercise. After that, feel free to carry on with regular activities. Keep soaps and lotions away from the ZIO XT Patch.  This will be mailed to you, please expect 7-10 days to receive.         Follow-Up: At [facility], you and your health needs are our priority.  As part of our continuing mission to provide you with exceptional heart care, we have created designated Provider Care Teams.  These Care Teams include your primary Cardiologist (physician) and Advanced Practice Providers (APPs -  Physician Assistants and Nurse Practitioners) who all work together to provide you with the care you need, when you need it.  We recommend signing up for the patient portal called "MyChart".  Sign up information is provided on this After Visit Summary.  MyChart is used to connect with patients for Virtual Visits (Telemedicine).  Patients are able to view lab/test results, encounter notes, upcoming appointments, etc.  Non-urgent messages can be sent to your provider as well.   To learn more about what you can do with MyChart, go to [email or web address].    Your next appointment:   6 month(s)  The format for your next appointment:   In Person  Provider:   Lennie Odor, MD

## 2019-07-30 ENCOUNTER — Telehealth: Payer: Self-pay | Admitting: Cardiovascular Disease

## 2019-07-30 ENCOUNTER — Other Ambulatory Visit (INDEPENDENT_AMBULATORY_CARE_PROVIDER_SITE_OTHER): Payer: Managed Care, Other (non HMO)

## 2019-07-30 DIAGNOSIS — I639 Cerebral infarction, unspecified: Secondary | ICD-10-CM

## 2019-07-30 DIAGNOSIS — I4891 Unspecified atrial fibrillation: Secondary | ICD-10-CM

## 2019-07-30 DIAGNOSIS — I2510 Atherosclerotic heart disease of native coronary artery without angina pectoris: Secondary | ICD-10-CM

## 2019-07-30 NOTE — Telephone Encounter (Signed)
New Message  We are recommending the COVID-19 vaccine to all of our patients. Cardiac medications (including blood thinners) should not deter anyone from being vaccinated and there is no need to hold any of those medications prior to vaccine administration.     Currently, there is a hotline to call (active 05/30/19) to schedule vaccination appointments as no walk-ins will be accepted.   Number: [phone].    If an appointment is not available please go to [email or web address] to sign up for notification when additional vaccine appointments are available.   If you have further questions or concerns about the vaccine process, please visit [email or web address] or contact your primary care physician.   

## 2019-08-08 ENCOUNTER — Ambulatory Visit: Payer: Managed Care, Other (non HMO) | Admitting: Cardiovascular Disease

## 2019-08-12 ENCOUNTER — Ambulatory Visit: Payer: 59 | Admitting: Internal Medicine

## 2019-08-22 ENCOUNTER — Other Ambulatory Visit: Payer: Self-pay | Admitting: Cardiovascular Disease

## 2019-08-22 DIAGNOSIS — I639 Cerebral infarction, unspecified: Secondary | ICD-10-CM

## 2019-08-22 DIAGNOSIS — I2510 Atherosclerotic heart disease of native coronary artery without angina pectoris: Secondary | ICD-10-CM

## 2019-08-22 DIAGNOSIS — I4891 Unspecified atrial fibrillation: Secondary | ICD-10-CM

## 2019-08-26 ENCOUNTER — Telehealth: Payer: Self-pay | Admitting: Cardiovascular Disease

## 2019-08-26 NOTE — Telephone Encounter (Signed)
New message   Patient's wife on DPR is returning call for monitor results. Please call.

## 2019-08-26 NOTE — Telephone Encounter (Signed)
Was able to contact patient, and gave results of monitor-  He will notify his wife.  Patient thankful for call back.

## 2019-08-31 ENCOUNTER — Other Ambulatory Visit: Payer: Self-pay | Admitting: Cardiovascular Disease

## 2019-09-25 ENCOUNTER — Ambulatory Visit: Payer: Managed Care, Other (non HMO) | Admitting: Internal Medicine

## 2019-10-02 ENCOUNTER — Other Ambulatory Visit: Payer: Self-pay

## 2019-10-02 ENCOUNTER — Encounter: Payer: Self-pay | Admitting: Internal Medicine

## 2019-10-02 ENCOUNTER — Ambulatory Visit (INDEPENDENT_AMBULATORY_CARE_PROVIDER_SITE_OTHER): Payer: Managed Care, Other (non HMO) | Admitting: Internal Medicine

## 2019-10-02 DIAGNOSIS — M25519 Pain in unspecified shoulder: Secondary | ICD-10-CM | POA: Insufficient documentation

## 2019-10-02 DIAGNOSIS — N529 Male erectile dysfunction, unspecified: Secondary | ICD-10-CM | POA: Diagnosis not present

## 2019-10-02 DIAGNOSIS — M25512 Pain in left shoulder: Secondary | ICD-10-CM

## 2019-10-02 DIAGNOSIS — G8929 Other chronic pain: Secondary | ICD-10-CM | POA: Diagnosis not present

## 2019-10-02 DIAGNOSIS — I10 Essential (primary) hypertension: Secondary | ICD-10-CM

## 2019-10-02 MED ORDER — TRAZODONE HCL 50 MG PO TABS: 50.0000 mg | ORAL_TABLET | Freq: Every evening | ORAL | 1 refills | Status: DC | PRN

## 2019-10-02 MED ORDER — TRAMADOL HCL 50 MG PO TABS: 50.0000 mg | ORAL_TABLET | Freq: Four times a day (QID) | ORAL | 3 refills | Status: DC | PRN

## 2019-10-02 NOTE — Assessment & Plan Note (Signed)
Will inject °

## 2019-10-02 NOTE — Assessment & Plan Note (Signed)
Hold Zoloft and trazodone on the days when you have sex. Take Rx after

## 2019-10-02 NOTE — Assessment & Plan Note (Signed)
BP Readings from Last 3 Encounters:  10/02/19 124/76  07/28/19 122/84  06/25/19 122/84

## 2019-10-02 NOTE — Assessment & Plan Note (Signed)
Worse See procedure 

## 2019-10-02 NOTE — Patient Instructions (Signed)
Postprocedure instructions :    A Band-Aid should be left on for 12 hours. Injection therapy is not a cure itself. It is used in conjunction with other modalities. You can use nonsteroidal anti-inflammatories like ibuprofen , hot and cold compresses. Rest is recommended in the next 24 hours. You need to report immediately  if fever, chills or any signs of infection develop. 

## 2019-10-02 NOTE — Progress Notes (Signed)
Subjective:  Patient ID: Jonathon Griffin, male    DOB: 09/10/1955  Age: 64 y.o. MRN: [id number]  CC: No chief complaint on file.   HPI Jonathon SparrowDavid R Trnka presents for OA C/o R shoulder pain - worse F/u on CAD, depression, anxiety C/o mild ED  Outpatient Medications Prior to Visit  Medication Sig Dispense Refill  . aspirin EC 81 MG tablet Take 1 tablet (81 mg total) by mouth daily. 90 tablet 3  . atorvastatin (LIPITOR) 80 MG tablet Take 1 tablet (80 mg total) by mouth daily at 6 PM. 90 tablet 3  . Cholecalciferol (VITAMIN D3) 50 MCG (2000 UT) capsule Take 1 capsule (2,000 Units total) by mouth daily. 100 capsule 3  . clonazePAM (KLONOPIN) 0.5 MG tablet Take 0.5 tablets (0.25 mg total) by mouth 2 (two) times daily as needed for anxiety. 90 tablet 1  . famotidine (PEPCID) 20 MG tablet TAKE 1 TABLET BY MOUTH TWICE A DAY 180 tablet 1  . metoprolol succinate (TOPROL-XL) 25 MG 24 hr tablet Take 1 tablet (25 mg total) by mouth daily. 90 tablet 3  . Multiple Vitamin (MULTIVITAMIN WITH MINERALS) TABS tablet Take 1 tablet by mouth daily.    . sertraline (ZOLOFT) 25 MG tablet Take 1 tablet (25 mg total) by mouth daily. 90 tablet 3  . ticagrelor (BRILINTA) 90 MG TABS tablet Take 1 tablet (90 mg total) by mouth 2 (two) times daily. 180 tablet 3  . traMADol (ULTRAM) 50 MG tablet Take by mouth every 6 (six) hours as needed.    . traZODone (DESYREL) 50 MG tablet Take 1-2 tablets (50-100 mg total) by mouth at bedtime as needed for sleep. 180 tablet 1   No facility-administered medications prior to visit.    ROS: Review of Systems  Constitutional: Negative for appetite change, fatigue and unexpected weight change.  HENT: Negative for congestion, nosebleeds, sneezing, sore throat and trouble swallowing.   Eyes: Negative for itching and visual disturbance.  Respiratory: Negative for cough.   Cardiovascular: Negative for chest pain, palpitations and leg swelling.  Gastrointestinal: Negative for  abdominal distention, blood in stool, diarrhea and nausea.  Genitourinary: Negative for frequency and hematuria.  Musculoskeletal: Positive for arthralgias. Negative for back pain, gait problem, joint swelling and neck pain.  Skin: Negative for rash.  Neurological: Negative for dizziness, tremors, speech difficulty and weakness.  Psychiatric/Behavioral: Negative for agitation, dysphoric mood and sleep disturbance. The patient is not nervous/anxious.     Objective:  BP 124/76 (BP Location: Left Arm, Patient Position: Sitting, Cuff Size: Large)   Pulse 64   Temp 98.4 F (36.9 C) (Oral)   Ht 5\' 10"  (1.778 m)   Wt 186 lb (84.4 kg)   SpO2 95%   BMI 26.69 kg/m   BP Readings from Last 3 Encounters:  10/02/19 124/76  07/28/19 122/84  06/25/19 122/84    Wt Readings from Last 3 Encounters:  10/02/19 186 lb (84.4 kg)  07/28/19 184 lb 12.8 oz (83.8 kg)  06/25/19 180 lb (81.6 kg)    Physical Exam Constitutional:      General: He is not in acute distress.    Appearance: He is well-developed.     Comments: NAD  Eyes:     Conjunctiva/sclera: Conjunctivae normal.     Pupils: Pupils are equal, round, and reactive to light.  Neck:     Thyroid: No thyromegaly.     Vascular: No JVD.  Cardiovascular:     Rate and Rhythm: Normal rate and  regular rhythm.     Heart sounds: Normal heart sounds. No murmur. No friction rub. No gallop.   Pulmonary:     Effort: Pulmonary effort is normal. No respiratory distress.     Breath sounds: Normal breath sounds. No wheezing or rales.  Chest:     Chest wall: No tenderness.  Abdominal:     General: Bowel sounds are normal. There is no distension.     Palpations: Abdomen is soft. There is no mass.     Tenderness: There is no abdominal tenderness. There is no guarding or rebound.  Musculoskeletal:        General: Tenderness present. Normal range of motion.     Cervical back: Normal range of motion.  Lymphadenopathy:     Cervical: No cervical  adenopathy.  Skin:    General: Skin is warm and dry.     Findings: No rash.  Neurological:     Mental Status: He is alert and oriented to person, place, and time.     Cranial Nerves: No cranial nerve deficit.     Motor: No abnormal muscle tone.     Coordination: Coordination normal.     Gait: Gait normal.     Deep Tendon Reflexes: Reflexes are normal and symmetric.  Psychiatric:        Behavior: Behavior normal.        Thought Content: Thought content normal.        Judgment: Judgment normal.    L AC joint and L shoulder with  Pain   Procedure :Joint Injection, L  shoulder   Indication:  Subacromial bursitis with refractory  chronic pain.   Risks including unsuccessful procedure , bleeding, infection, bruising, skin atrophy, "steroid flare-up" and others were explained to the patient in detail as well as the benefits. Informed consent was obtained and signed.   Tthe patient was placed in a comfortable position. Lateral approach was used. Skin was prepped with Betadine and alcohol  and anesthetized with a cooling spray. Then, a 5 cc syringe with a 2 inch long 24-gauge needle was used for a joint injection.. The needle was advanced  Into the subacromial space.The bursa was injected with 3 mL of 2% lidocaine and 40 mg of Depo-Medrol .  Band-Aid was applied.   Tolerated well. Complications: None. Good pain relief following the procedure.     Procedure :Joint Injection, L  AC joint   Indication: AC OA with refractory  chronic pain.   Risks including unsuccessful procedure , bleeding, infection, bruising, skin atrophy, "steroid flare-up" and others were explained to the patient in detail as well as the benefits. Informed consent was obtained and signed.   Tthe patient was placed in a comfortable position. Vertical approach was used. Skin was prepped with Betadine and alcohol  and anesthetized with a cooling spray. Then, a 3 cc syringe with a 2 inch long 24-gauge needle was used for a  joint injection.. The needle was advanced  Into the [facility] space and was injected with 1 mL of 2% lidocaine and 40 mg of Depo-Medrol .  Band-Aid was applied.   Tolerated well. Complications: None. Good pain relief following the procedure.      Lab Results  Component Value Date   WBC 10.2 05/09/2019   HGB 13.8 05/09/2019   HCT 41.9 05/09/2019   PLT 312 05/09/2019   GLUCOSE 129 (H) 05/09/2019   CHOL 119 06/09/2019   TRIG 140 06/09/2019   HDL 39 (L) 06/09/2019   LDLDIRECT  158.0 02/12/2019   LDLCALC 56 06/09/2019   ALT 75 (H) 05/02/2019   AST 38 05/02/2019   NA 136 05/09/2019   K 4.1 05/09/2019   CL 100 05/09/2019   CREATININE 0.84 05/09/2019   BUN 21 05/09/2019   CO2 25 05/09/2019   TSH 2.20 02/12/2019   PSA 0.94 02/12/2019   INR 2.3 (H) 05/14/2019    ECHOCARDIOGRAM COMPLETE  Result Date: 05/28/2019   ECHOCARDIOGRAM REPORT   Patient Name:   [place] Laurita Quint Date of Exam: 05/28/2019 Medical Rec #:  [id number]       Height:       70.0 in Accession #:    [id number]      Weight:       178.0 lb Date of Birth:  04-Feb-1956       BSA:          1.99 m Patient Age:    63 years        BP:           130/79 mmHg Patient Gender: M               HR:           82 bpm. [facility] Procedure: 2D Echo, Cardiac Doppler and Color Doppler Indications:    CAD  History:        Patient has no prior history of Echocardiogram examinations.                 Risk Factors:Hypertension and Dyslipidemia. H/O Acute ST                 elevation myocardial infarction (STEMI) ,Acute on chronic                 respiratory failure with hypoxia,Alcohol abuse, in                 remission,Cardiac arrest.  Sonographer:    Celesta Gentile RCS Referring Phys: [id number] [place] THOMAS O'NEAL IMPRESSIONS  1. Left ventricular ejection fraction, by visual estimation, is 50 to 55%. The left ventricle has low normal function. There is no left ventricular hypertrophy.  2. Basal inferoseptal segment, basal inferolateral segment, basal  inferior segment, and mid inferolateral segment are abnormal.  3. Left ventricular diastolic parameters are indeterminate.  4. The left ventricle demonstrates regional wall motion abnormalities.  5. Global right ventricle has low normal systolic function.The right ventricular size is normal. No increase in right ventricular wall thickness.  6. Left atrial size was normal.  7. Right atrial size was normal.  8. The mitral valve is grossly normal. Trivial mitral valve regurgitation.  9. The tricuspid valve is grossly normal. 10. The aortic valve is tricuspid. Aortic valve regurgitation is trivial. 11. The pulmonic valve was grossly normal. Pulmonic valve regurgitation is trivial. 12. TR signal is inadequate for assessing pulmonary artery systolic pressure. 13. The inferior vena cava is normal in size with greater than 50% respiratory variability, suggesting right atrial pressure of 3 mmHg. FINDINGS  Left Ventricle: Left ventricular ejection fraction, by visual estimation, is 50 to 55%. The left ventricle has low normal function. The left ventricle demonstrates regional wall motion abnormalities. The left ventricular internal cavity size was the left ventricle is normal in size. There is no left ventricular hypertrophy. Left ventricular diastolic parameters are indeterminate.  LV Wall Scoring: The basal inferoseptal segment, basal inferolateral segment, and basal inferior segment are akinetic. The mid inferolateral segment is hypokinetic. All  remaining scored segments are normal. Right Ventricle: The right ventricular size is normal. No increase in right ventricular wall thickness. Global RV systolic function is has low normal systolic function. Left Atrium: Left atrial size was normal in size. Right Atrium: Right atrial size was normal in size Pericardium: There is no evidence of pericardial effusion. Mitral Valve: The mitral valve is grossly normal. Trivial mitral valve regurgitation. Tricuspid Valve: The tricuspid  valve is grossly normal. Tricuspid valve regurgitation is trivial. Aortic Valve: The aortic valve is tricuspid. Aortic valve regurgitation is trivial. Mild aortic valve annular calcification. Pulmonic Valve: The pulmonic valve was grossly normal. Pulmonic valve regurgitation is trivial. Pulmonic regurgitation is trivial. Aorta: The aortic root is normal in size and structure. Venous: The inferior vena cava is normal in size with greater than 50% respiratory variability, suggesting right atrial pressure of 3 mmHg. IAS/Shunts: No atrial level shunt detected by color flow Doppler.  LEFT VENTRICLE PLAX 2D LVIDd:         4.95 cm       Diastology LVIDs:         3.47 cm       LV e' lateral:   7.72 cm/s LV PW:         1.01 cm       LV E/e' lateral: 6.5 LV IVS:        0.85 cm       LV e' medial:    8.70 cm/s LVOT diam:     2.10 cm       LV E/e' medial:  5.8 LV SV:         66 ml LV SV Index:   32.73 LVOT Area:     3.46 cm  LV Volumes (MOD) LV area d, A2C:    30.90 cm LV area d, A4C:    32.80 cm LV area s, A2C:    17.50 cm LV area s, A4C:    19.90 cm LV major d, A2C:   8.45 cm LV major d, A4C:   7.98 cm LV major s, A2C:   7.05 cm LV major s, A4C:   7.16 cm LV vol d, MOD A2C: 95.2 ml LV vol d, MOD A4C: 111.0 ml LV vol s, MOD A2C: 38.4 ml LV vol s, MOD A4C: 46.6 ml LV SV MOD A2C:     56.8 ml LV SV MOD A4C:     111.0 ml LV SV MOD BP:      63.2 ml RIGHT VENTRICLE RV S prime:     11.70 cm/s TAPSE (M-mode): 1.5 cm LEFT ATRIUM             Index       RIGHT ATRIUM           Index LA diam:        2.60 cm 1.31 cm/m  RA Area:     15.40 cm LA Vol (A2C):   52.8 ml 26.58 ml/m RA Volume:   43.00 ml  21.65 ml/m LA Vol (A4C):   32.1 ml 16.16 ml/m LA Biplane Vol: 41.7 ml 20.99 ml/m  AORTIC VALVE LVOT Vmax:   128.00 cm/s LVOT Vmean:  75.900 cm/s LVOT VTI:    0.199 m  AORTA Ao Root diam: 3.30 cm MITRAL VALVE MV Area (PHT): 2.73 cm             SHUNTS MV PHT:        80.62 msec  Systemic VTI:  0.20 m MV Decel Time: 278 msec              Systemic Diam: 2.10 cm MV E velocity: 50.30 cm/s 103 cm/s MV A velocity: 80.60 cm/s 70.3 cm/s MV E/A ratio:  0.62       1.5  Rozann Lesches MD Electronically signed by Rozann Lesches MD Signature Date/Time: 05/28/2019/4:40:02 PM    Final     Assessment & Plan:   There are no diagnoses linked to this encounter.   No orders of the defined types were placed in this encounter.    Follow-up: No follow-ups on file.  Walker Kehr, MD

## 2019-10-17 ENCOUNTER — Ambulatory Visit: Payer: Managed Care, Other (non HMO) | Admitting: Neurology

## 2020-01-01 ENCOUNTER — Ambulatory Visit (INDEPENDENT_AMBULATORY_CARE_PROVIDER_SITE_OTHER): Payer: Managed Care, Other (non HMO) | Admitting: Internal Medicine

## 2020-01-01 ENCOUNTER — Encounter: Payer: Self-pay | Admitting: Internal Medicine

## 2020-01-01 ENCOUNTER — Other Ambulatory Visit: Payer: Self-pay

## 2020-01-01 VITALS — BP 128/70 | HR 68 | Temp 98.7°F | Ht 70.0 in | Wt 191.0 lb

## 2020-01-01 DIAGNOSIS — M25512 Pain in left shoulder: Secondary | ICD-10-CM | POA: Diagnosis not present

## 2020-01-01 DIAGNOSIS — F413 Other mixed anxiety disorders: Secondary | ICD-10-CM

## 2020-01-01 DIAGNOSIS — I10 Essential (primary) hypertension: Secondary | ICD-10-CM | POA: Diagnosis not present

## 2020-01-01 DIAGNOSIS — G8929 Other chronic pain: Secondary | ICD-10-CM

## 2020-01-01 DIAGNOSIS — I2510 Atherosclerotic heart disease of native coronary artery without angina pectoris: Secondary | ICD-10-CM | POA: Diagnosis not present

## 2020-01-01 DIAGNOSIS — Z0000 Encounter for general adult medical examination without abnormal findings: Secondary | ICD-10-CM

## 2020-01-01 DIAGNOSIS — E785 Hyperlipidemia, unspecified: Secondary | ICD-10-CM

## 2020-01-01 DIAGNOSIS — I2583 Coronary atherosclerosis due to lipid rich plaque: Secondary | ICD-10-CM

## 2020-01-01 MED ORDER — TRAMADOL HCL 50 MG PO TABS: 50.0000 mg | ORAL_TABLET | Freq: Four times a day (QID) | ORAL | 3 refills | Status: DC | PRN

## 2020-01-01 MED ORDER — TRAZODONE HCL 50 MG PO TABS: 50.0000 mg | ORAL_TABLET | Freq: Every evening | ORAL | 1 refills | Status: DC | PRN

## 2020-01-01 MED ORDER — METHYLPREDNISOLONE ACETATE 40 MG/ML IJ SUSP
40.0000 mg | Freq: Once | INTRAMUSCULAR | Status: AC
  Administered 2020-01-01: 40 mg via INTRAMUSCULAR

## 2020-01-01 NOTE — Assessment & Plan Note (Signed)
Will inject - see procedure 

## 2020-01-01 NOTE — Patient Instructions (Signed)
Postprocedure instructions :    A Band-Aid should be left on for 12 hours. Injection therapy is not a cure itself. It is used in conjunction with other modalities. You can use nonsteroidal anti-inflammatories like ibuprofen , hot and cold compresses. Rest is recommended in the next 24 hours. You need to report immediately  if fever, chills or any signs of infection develop. 

## 2020-01-01 NOTE — Progress Notes (Signed)
Subjective:  Patient ID: Jonathon Griffin, male    DOB: November 03, 1955  Age: 64 y.o. MRN: [id number]  CC: No chief complaint on file.   HPI Jonathon Griffin presents for coronary disease, osteoarthritis, anxiety. Estevan is complaining of pain in the left shoulder with range of motion.  He would like to have an injection  Outpatient Medications Prior to Visit  Medication Sig Dispense Refill  . aspirin EC 81 MG tablet Take 1 tablet (81 mg total) by mouth daily. 90 tablet 3  . atorvastatin (LIPITOR) 80 MG tablet Take 1 tablet (80 mg total) by mouth daily at 6 PM. 90 tablet 3  . Cholecalciferol (VITAMIN D3) 50 MCG (2000 UT) capsule Take 1 capsule (2,000 Units total) by mouth daily. 100 capsule 3  . famotidine (PEPCID) 20 MG tablet TAKE 1 TABLET BY MOUTH TWICE A DAY 180 tablet 1  . lamoTRIgine (LAMICTAL) 100 MG tablet Take 100 mg by mouth daily.    . metoprolol succinate (TOPROL-XL) 25 MG 24 hr tablet Take 1 tablet (25 mg total) by mouth daily. 90 tablet 3  . Multiple Vitamin (MULTIVITAMIN WITH MINERALS) TABS tablet Take 1 tablet by mouth daily.    . sertraline (ZOLOFT) 25 MG tablet Take 1 tablet (25 mg total) by mouth daily. 90 tablet 3  . ticagrelor (BRILINTA) 90 MG TABS tablet Take 1 tablet (90 mg total) by mouth 2 (two) times daily. 180 tablet 3  . traMADol (ULTRAM) 50 MG tablet Take 1 tablet (50 mg total) by mouth every 6 (six) hours as needed for severe pain. 90 tablet 3  . traZODone (DESYREL) 50 MG tablet Take 1-2 tablets (50-100 mg total) by mouth at bedtime as needed for sleep. 180 tablet 1  . clonazePAM (KLONOPIN) 0.5 MG tablet Take 0.5 tablets (0.25 mg total) by mouth 2 (two) times daily as needed for anxiety. (Patient not taking: Reported on 01/01/2020) 90 tablet 1   No facility-administered medications prior to visit.    ROS: Review of Systems  Constitutional: Negative for appetite change, fatigue and unexpected weight change.  HENT: Negative for congestion, nosebleeds, sneezing,  sore throat and trouble swallowing.   Eyes: Negative for itching and visual disturbance.  Respiratory: Negative for cough.   Cardiovascular: Negative for chest pain, palpitations and leg swelling.  Gastrointestinal: Negative for abdominal distention, blood in stool, diarrhea and nausea.  Genitourinary: Negative for frequency and hematuria.  Musculoskeletal: Negative for back pain, gait problem, joint swelling and neck pain.  Skin: Negative for rash.  Neurological: Negative for dizziness, tremors, speech difficulty and weakness.  Psychiatric/Behavioral: Negative for agitation, dysphoric mood and sleep disturbance. The patient is not nervous/anxious.     Objective:  BP 128/70 (BP Location: Left Arm, Patient Position: Sitting, Cuff Size: Normal)   Pulse 68   Temp 98.7 F (37.1 C) (Oral)   Ht 5\' 10"  (1.778 m)   Wt 191 lb (86.6 kg)   SpO2 96%   BMI 27.41 kg/m   BP Readings from Last 3 Encounters:  01/01/20 128/70  10/02/19 124/76  07/28/19 122/84    Wt Readings from Last 3 Encounters:  01/01/20 191 lb (86.6 kg)  10/02/19 186 lb (84.4 kg)  07/28/19 184 lb 12.8 oz (83.8 kg)    Physical Exam Constitutional:      General: He is not in acute distress.    Appearance: He is well-developed.     Comments: NAD  Eyes:     Conjunctiva/sclera: Conjunctivae normal.  Pupils: Pupils are equal, round, and reactive to light.  Neck:     Thyroid: No thyromegaly.     Vascular: No JVD.  Cardiovascular:     Rate and Rhythm: Normal rate and regular rhythm.     Heart sounds: Normal heart sounds. No murmur heard.  No friction rub. No gallop.   Pulmonary:     Effort: Pulmonary effort is normal. No respiratory distress.     Breath sounds: Normal breath sounds. No wheezing or rales.  Chest:     Chest wall: No tenderness.  Abdominal:     General: Bowel sounds are normal. There is no distension.     Palpations: Abdomen is soft. There is no mass.     Tenderness: There is no abdominal  tenderness. There is no guarding or rebound.  Musculoskeletal:        General: No tenderness. Normal range of motion.     Cervical back: Normal range of motion.  Lymphadenopathy:     Cervical: No cervical adenopathy.  Skin:    General: Skin is warm and dry.     Findings: No rash.  Neurological:     Mental Status: He is alert and oriented to person, place, and time.     Cranial Nerves: No cranial nerve deficit.     Motor: No abnormal muscle tone.     Coordination: Coordination normal.     Gait: Gait normal.     Deep Tendon Reflexes: Reflexes are normal and symmetric.  Psychiatric:        Behavior: Behavior normal.        Thought Content: Thought content normal.        Judgment: Judgment normal.   L shoulder tender, grinding feeling   Procedure :Joint Injection, L  shoulder   Indication:  Subacromial bursitis with refractory  chronic pain.   Risks including unsuccessful procedure , bleeding, infection, bruising, skin atrophy, "steroid flare-up" and others were explained to the patient in detail as well as the benefits. Informed consent was obtained and signed.   Tthe patient was placed in a comfortable position. Lateral approach was used. Skin was prepped with Betadine and alcohol  and anesthetized with a cooling spray. Then, a 5 cc syringe with a 2 inch long 24-gauge needle was used for a joint injection.. The needle was advanced  Into the subacromial space.The bursa was injected with 3 mL of 2% lidocaine and 40 mg of Depo-Medrol .  Band-Aid was applied.   Tolerated well. Complications: None. Good pain relief following the procedure.   Postprocedure instructions :    A Band-Aid should be left on for 12 hours. Injection therapy is not a cure itself. It is used in conjunction with other modalities. You can use nonsteroidal anti-inflammatories like ibuprofen , hot and cold compresses. Rest is recommended in the next 24 hours. You need to report immediately  if fever, chills or any  signs of infection develop.    Lab Results  Component Value Date   WBC 10.2 05/09/2019   HGB 13.8 05/09/2019   HCT 41.9 05/09/2019   PLT 312 05/09/2019   GLUCOSE 129 (H) 05/09/2019   CHOL 119 06/09/2019   TRIG 140 06/09/2019   HDL 39 (L) 06/09/2019   LDLDIRECT 158.0 02/12/2019   LDLCALC 56 06/09/2019   ALT 75 (H) 05/02/2019   AST 38 05/02/2019   NA 136 05/09/2019   K 4.1 05/09/2019   CL 100 05/09/2019   CREATININE 0.84 05/09/2019   BUN 21  05/09/2019   CO2 25 05/09/2019   TSH 2.20 02/12/2019   PSA 0.94 02/12/2019   INR 2.3 (H) 05/14/2019    ECHOCARDIOGRAM COMPLETE  Result Date: 05/28/2019   ECHOCARDIOGRAM REPORT   Patient Name:   Einar Laurita Quint Shrake Date of Exam: 05/28/2019 Medical Rec #:  [id number]       Height:       70.0 in Accession #:    [id number]      Weight:       178.0 lb Date of Birth:  06-17-1955       BSA:          1.99 m Patient Age:    63 years        BP:           130/79 mmHg Patient Gender: M               HR:           82 bpm. [name] Procedure: 2D Echo, Cardiac Doppler and Color Doppler Indications:    CAD  History:        Patient has no prior history of Echocardiogram examinations.                 Risk Factors:Hypertension and Dyslipidemia. H/O Acute ST                 elevation myocardial infarction (STEMI) ,Acute on chronic                 respiratory failure with hypoxia,Alcohol abuse, in                 remission,Cardiac arrest.  Sonographer:    Celesta GentileBernard White RCS Referring Phys: [id number] [place] THOMAS O'NEAL IMPRESSIONS  1. Left ventricular ejection fraction, by visual estimation, is 50 to 55%. The left ventricle has low normal function. There is no left ventricular hypertrophy.  2. Basal inferoseptal segment, basal inferolateral segment, basal inferior segment, and mid inferolateral segment are abnormal.  3. Left ventricular diastolic parameters are indeterminate.  4. The left ventricle demonstrates regional wall motion abnormalities.  5. Global right  ventricle has low normal systolic function.The right ventricular size is normal. No increase in right ventricular wall thickness.  6. Left atrial size was normal.  7. Right atrial size was normal.  8. The mitral valve is grossly normal. Trivial mitral valve regurgitation.  9. The tricuspid valve is grossly normal. 10. The aortic valve is tricuspid. Aortic valve regurgitation is trivial. 11. The pulmonic valve was grossly normal. Pulmonic valve regurgitation is trivial. 12. TR signal is inadequate for assessing pulmonary artery systolic pressure. 13. The inferior vena cava is normal in size with greater than 50% respiratory variability, suggesting right atrial pressure of 3 mmHg. FINDINGS  Left Ventricle: Left ventricular ejection fraction, by visual estimation, is 50 to 55%. The left ventricle has low normal function. The left ventricle demonstrates regional wall motion abnormalities. The left ventricular internal cavity size was the left ventricle is normal in size. There is no left ventricular hypertrophy. Left ventricular diastolic parameters are indeterminate.  LV Wall Scoring: The basal inferoseptal segment, basal inferolateral segment, and basal inferior segment are akinetic. The mid inferolateral segment is hypokinetic. All remaining scored segments are normal. Right Ventricle: The right ventricular size is normal. No increase in right ventricular wall thickness. Global RV systolic function is has low normal systolic function. Left Atrium: Left atrial size was normal in size. Right Atrium: Right  atrial size was normal in size Pericardium: There is no evidence of pericardial effusion. Mitral Valve: The mitral valve is grossly normal. Trivial mitral valve regurgitation. Tricuspid Valve: The tricuspid valve is grossly normal. Tricuspid valve regurgitation is trivial. Aortic Valve: The aortic valve is tricuspid. Aortic valve regurgitation is trivial. Mild aortic valve annular calcification. Pulmonic Valve: The  pulmonic valve was grossly normal. Pulmonic valve regurgitation is trivial. Pulmonic regurgitation is trivial. Aorta: The aortic root is normal in size and structure. Venous: The inferior vena cava is normal in size with greater than 50% respiratory variability, suggesting right atrial pressure of 3 mmHg. IAS/Shunts: No atrial level shunt detected by color flow Doppler.  LEFT VENTRICLE PLAX 2D LVIDd:         4.95 cm       Diastology LVIDs:         3.47 cm       LV e' lateral:   7.72 cm/s LV PW:         1.01 cm       LV E/e' lateral: 6.5 LV IVS:        0.85 cm       LV e' medial:    8.70 cm/s LVOT diam:     2.10 cm       LV E/e' medial:  5.8 LV SV:         66 ml LV SV Index:   32.73 LVOT Area:     3.46 cm  LV Volumes (MOD) LV area d, A2C:    30.90 cm LV area d, A4C:    32.80 cm LV area s, A2C:    17.50 cm LV area s, A4C:    19.90 cm LV major d, A2C:   8.45 cm LV major d, A4C:   7.98 cm LV major s, A2C:   7.05 cm LV major s, A4C:   7.16 cm LV vol d, MOD A2C: 95.2 ml LV vol d, MOD A4C: 111.0 ml LV vol s, MOD A2C: 38.4 ml LV vol s, MOD A4C: 46.6 ml LV SV MOD A2C:     56.8 ml LV SV MOD A4C:     111.0 ml LV SV MOD BP:      63.2 ml RIGHT VENTRICLE RV S prime:     11.70 cm/s TAPSE (M-mode): 1.5 cm LEFT ATRIUM             Index       RIGHT ATRIUM           Index LA diam:        2.60 cm 1.31 cm/m  RA Area:     15.40 cm LA Vol (A2C):   52.8 ml 26.58 ml/m RA Volume:   43.00 ml  21.65 ml/m LA Vol (A4C):   32.1 ml 16.16 ml/m LA Biplane Vol: 41.7 ml 20.99 ml/m  AORTIC VALVE LVOT Vmax:   128.00 cm/s LVOT Vmean:  75.900 cm/s LVOT VTI:    0.199 m  AORTA Ao Root diam: 3.30 cm MITRAL VALVE MV Area (PHT): 2.73 cm             SHUNTS MV PHT:        80.62 msec           Systemic VTI:  0.20 m MV Decel Time: 278 msec             Systemic Diam: 2.10 cm MV E velocity: 50.30 cm/s 103 cm/s MV A velocity: 80.60 cm/s 70.3 cm/s  MV E/A ratio:  0.62       1.5  Nona Dell MD Electronically signed by Nona Dell MD Signature  Date/Time: 05/28/2019/4:40:02 PM    Final     Assessment & Plan:   There are no diagnoses linked to this encounter.   No orders of the defined types were placed in this encounter.    Follow-up: No follow-ups on file.  Sonda Primes, MD

## 2020-01-04 DIAGNOSIS — I2510 Atherosclerotic heart disease of native coronary artery without angina pectoris: Secondary | ICD-10-CM | POA: Insufficient documentation

## 2020-01-04 NOTE — Assessment & Plan Note (Signed)
Status post cardiac arrest, MI On Lipitor, aspirin, metoprolol, Brilinta

## 2020-01-04 NOTE — Assessment & Plan Note (Signed)
On Zoloft. °

## 2020-01-04 NOTE — Assessment & Plan Note (Signed)
On no added salt diet 

## 2020-01-04 NOTE — Assessment & Plan Note (Signed)
On Lipitor 

## 2020-01-06 ENCOUNTER — Other Ambulatory Visit: Payer: Self-pay | Admitting: Internal Medicine

## 2020-01-06 NOTE — Telephone Encounter (Signed)
New message:   1.Medication Requested: lamoTRIgine (LAMICTAL) 100 MG tablet 2. Pharmacy (Name, Street, [place]): [id number] - [place], [place] - [address] 3. On Med List: Yes  4. Last Visit with PCP: 01/01/20  5. Next visit date with PCP: None   Agent: Please be advised that RX refills may take up to 3 business days. We ask that you follow-up with your pharmacy.

## 2020-01-08 MED ORDER — LAMOTRIGINE 100 MG PO TABS: 100.0000 mg | ORAL_TABLET | Freq: Every day | ORAL | 2 refills | Status: DC

## 2020-01-30 ENCOUNTER — Other Ambulatory Visit: Payer: Self-pay

## 2020-01-30 ENCOUNTER — Ambulatory Visit (INDEPENDENT_AMBULATORY_CARE_PROVIDER_SITE_OTHER): Payer: Managed Care, Other (non HMO) | Admitting: Cardiovascular Disease

## 2020-01-30 ENCOUNTER — Encounter: Payer: Self-pay | Admitting: Cardiovascular Disease

## 2020-01-30 VITALS — BP 124/78 | HR 64 | Ht 70.0 in | Wt 194.4 lb

## 2020-01-30 DIAGNOSIS — I2510 Atherosclerotic heart disease of native coronary artery without angina pectoris: Secondary | ICD-10-CM

## 2020-01-30 DIAGNOSIS — E782 Mixed hyperlipidemia: Secondary | ICD-10-CM

## 2020-01-30 DIAGNOSIS — I10 Essential (primary) hypertension: Secondary | ICD-10-CM

## 2020-01-30 DIAGNOSIS — I469 Cardiac arrest, cause unspecified: Secondary | ICD-10-CM

## 2020-01-30 NOTE — Progress Notes (Signed)
Cardiology Office Note:   Date:  01/30/2020  NAME:  Jonathon Griffin    MRN: [id number] DOB:  1956-04-28   PCP:  Tresa Garter, MD  Cardiologist:  Reatha Harps, MD   Referring MD: Tresa Garter, MD   Chief Complaint  Patient presents with  . Follow-up   History of Present Illness:   Jonathon Griffin is a 64 y.o. male with a hx of inferior STEMI c/b VF arrest (03/2019), stroke, HLD who presents for follow-up. He had strokes during hospitalization for VF arrest/STEMI but no afib on monitor. Strokes attributed to cardiac arrest.   He reports he is doing well.  He presents with his wife today.  Blood pressure 124/78.  Heart rate 64.  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  He has no chest pain or shortness of breath.  He does not have any structured exercise program but has no limitations with his current level of activity which includes working.  No major bleeding issues on aspirin and ticagrelor.  Has had no dyspnea.  His most recent LDL cholesterol is at goal.  We did discuss he will be able to come off his Brilinta in November.  He reports this will be okay with him.  He denies any symptoms.  Overall appears to be doing quite well.  Problem List 1. Cardiac Arrest 04/06/2019 2/2 inferior STEMI [facility]) -s/p PCI to RCA -course c/b aspiration PNA, acute liver injury, resp failure requiring trach, protein deficiency requiring PEG 2. Multiple strokes (subcortical R occipital lobe/R cerebellar) -in setting of cardiac arrest  3. Hyperlipidemia -T chol 119, HDL 39, LDL 56, TG 140  Past Medical History: Past Medical History:  Diagnosis Date  . Acute on chronic respiratory failure with hypoxia (HCC)   . Acute ST elevation myocardial infarction (STEMI) (HCC)   . Anxiety   . Arthritis   . Aspiration pneumonia due to gastric secretions (HCC)   . Cardiac arrest (HCC)   . Coronary artery disease   . Depression   . Embolic stroke involving anterior  cerebral artery (HCC)   . Hyperlipidemia   . Hypertension   . Obstructive sleep apnea   . Sleep apnea     Past Surgical History: Past Surgical History:  Procedure Laterality Date  . bicep surgery    . CARDIAC CATHETERIZATION    . PEG PLACEMENT    . TRACHEOSTOMY      Current Medications: Current Meds  Medication Sig  . aspirin EC 81 MG tablet Take 1 tablet (81 mg total) by mouth daily.  Marland Kitchen atorvastatin (LIPITOR) 80 MG tablet Take 1 tablet (80 mg total) by mouth daily at 6 PM.  . Cholecalciferol (VITAMIN D3) 50 MCG (2000 UT) capsule Take 1 capsule (2,000 Units total) by mouth daily.  . famotidine (PEPCID) 20 MG tablet TAKE 1 TABLET BY MOUTH TWICE A DAY  . lamoTRIgine (LAMICTAL) 100 MG tablet Take 1 tablet (100 mg total) by mouth daily.  . metoprolol succinate (TOPROL-XL) 25 MG 24 hr tablet Take 1 tablet (25 mg total) by mouth daily.  . traMADol (ULTRAM) 50 MG tablet Take 1 tablet (50 mg total) by mouth every 6 (six) hours as needed for severe pain.  . traZODone (DESYREL) 50 MG tablet Take 1-2 tablets (50-100 mg total) by mouth at bedtime as needed for sleep.  . [DISCONTINUED] ticagrelor (BRILINTA) 90 MG TABS tablet Take 1 tablet (90 mg total) by mouth 2 (two) times daily.  Allergies:    Oxycodone-acetaminophen and Wellbutrin [bupropion]   Social History: Social History   Socioeconomic History  . Marital status: Married    Spouse name: Not on file  . Number of children: Not on file  . Years of education: Not on file  . Highest education level: Not on file  Occupational History  . Occupation: welder  Tobacco Use  . Smoking status: Never Smoker  . Smokeless tobacco: Never Used  Substance and Sexual Activity  . Alcohol use: No  . Drug use: No  . Sexual activity: Yes  Other Topics Concern  . Not on file  Social History Narrative  . Not on file   Social Determinants of Health   Financial Resource Strain:   . Difficulty of Paying Living Expenses: Not on file  Food  Insecurity:   . Worried About Programme researcher, broadcasting/film/video in the Last Year: Not on file  . Ran Out of Food in the Last Year: Not on file  Transportation Needs:   . Lack of Transportation (Medical): Not on file  . Lack of Transportation (Non-Medical): Not on file  Physical Activity:   . Days of Exercise per Week: Not on file  . Minutes of Exercise per Session: Not on file  Stress:   . Feeling of Stress : Not on file  Social Connections:   . Frequency of Communication with Friends and Family: Not on file  . Frequency of Social Gatherings with Friends and Family: Not on file  . Attends Religious Services: Not on file  . Active Member of Clubs or Organizations: Not on file  . Attends Banker Meetings: Not on file  . Marital Status: Not on file     Family History: The patient's family history includes Arthritis in his mother and sister.  ROS:   All other ROS reviewed and negative. Pertinent positives noted in the HPI.     EKGs/Labs/Other Studies Reviewed:   The following studies were personally reviewed by me today:  TTE 05/28/2019 1. Left ventricular ejection fraction, by visual estimation, is 50 to  55%. The left ventricle has low normal function. There is no left  ventricular hypertrophy.  2. Basal inferoseptal segment, basal inferolateral segment, basal  inferior segment, and mid inferolateral segment are abnormal.  3. Left ventricular diastolic parameters are indeterminate.  4. The left ventricle demonstrates regional wall motion abnormalities.  5. Global right ventricle has low normal systolic function.The right  ventricular size is normal. No increase in right ventricular wall  thickness.  6. Left atrial size was normal.  7. Right atrial size was normal.  8. The mitral valve is grossly normal. Trivial mitral valve  regurgitation.  9. The tricuspid valve is grossly normal.  10. The aortic valve is tricuspid. Aortic valve regurgitation is trivial.  11. The  pulmonic valve was grossly normal. Pulmonic valve regurgitation is  trivial.  12. TR signal is inadequate for assessing pulmonary artery systolic  pressure.  13. The inferior vena cava is normal in size with greater than 50%  respiratory variability, suggesting right atrial pressure of 3 mmHg.  Zio 08/25/2019 1. Brief ectopic atrial tachycardia episodes (15 in 14 day wear period) with longest 20.5 seconds in duration.  2. No atrial fibrillation.  3. Rare ectopy.    Recent Labs: 02/12/2019: TSH 2.20 05/02/2019: ALT 75; Magnesium 1.9 05/09/2019: BUN 21; Creatinine, Ser 0.84; Hemoglobin 13.8; Platelets 312; Potassium 4.1; Sodium 136   Recent Lipid Panel    Component Value  Date/Time   CHOL 119 06/09/2019 1021   TRIG 140 06/09/2019 1021   HDL 39 (L) 06/09/2019 1021   CHOLHDL 3.1 06/09/2019 1021   CHOLHDL 6 02/12/2019 1643   VLDL 64.2 (H) 02/12/2019 1643   LDLCALC 56 06/09/2019 1021   LDLDIRECT 158.0 02/12/2019 1643    Physical Exam:   VS:  BP 124/78   Pulse 64   Ht 5\' 10"  (1.778 m)   Wt 194 lb 6.4 oz (88.2 kg)   SpO2 98%   BMI 27.89 kg/m    Wt Readings from Last 3 Encounters:  01/30/20 194 lb 6.4 oz (88.2 kg)  01/01/20 191 lb (86.6 kg)  10/02/19 186 lb (84.4 kg)    General: Well nourished, well developed, in no acute distress Heart: Atraumatic, normal size  Eyes: PEERLA, EOMI  Neck: Supple, no JVD Endocrine: No thryomegaly Cardiac: Normal S1, S2; RRR; no murmurs, rubs, or gallops Lungs: Clear to auscultation bilaterally, no wheezing, rhonchi or rales  Abd: Soft, nontender, no hepatomegaly  Ext: No edema, pulses 2+ Musculoskeletal: No deformities, BUE and BLE strength normal and equal Skin: Warm and dry, no rashes   Neuro: Alert and oriented to person, place, time, and situation, CNII-XII grossly intact, no focal deficits  Psych: Normal mood and affect   ASSESSMENT:   ABSHIR PAOLINI is a 64 y.o. male who presents for the following: 1. Coronary artery disease  involving native coronary artery of native heart without angina pectoris   2. Cardiac arrest (HCC)   3. Mixed hyperlipidemia   4. Essential hypertension     PLAN:   1. Coronary artery disease involving native coronary artery of native heart without angina pectoris 2. Cardiac arrest [facility]) -Inferior STEMI in November 2020 in [place].  Complicated by VF arrest.  Underwent PCI to the RCA.  EF 50-55% with inferior wall hypokinesis. -No symptoms of angina.  Appears to be doing well.  We will plan to complete aspirin and Brilinta until April 05, 2020.  After this time he will stop Brilinta.  He is okay with this. -He will continue high intensity statin therapy.  Most recent LDL cholesterol 56 which is at goal. -No symptoms of angina.  No indication for ICD.  Doing well.  3. Mixed hyperlipidemia -Continue Lipitor.  LDL at goal.  4. Essential hypertension -Blood pressure well controlled on metoprolol.  Disposition: Return in about 6 months (around 07/29/2020).  Medication Adjustments/Labs and Tests Ordered: Current medicines are reviewed at length with the patient today.  Concerns regarding medicines are outlined above.  No orders of the defined types were placed in this encounter.  No orders of the defined types were placed in this encounter.   Patient Instructions  Medication Instructions:  Stop Brilinta- November 15th  *If you need a refill on your cardiac medications before your next appointment, please call your pharmacy*   Follow-Up: At [facility], you and your health needs are our priority.  As part of our continuing mission to provide you with exceptional heart care, we have created designated Provider Care Teams.  These Care Teams include your primary Cardiologist (physician) and Advanced Practice Providers (APPs -  Physician Assistants and Nurse Practitioners) who all work together to provide you with the care you need, when you need it.  We recommend signing  up for the patient portal called "MyChart".  Sign up information is provided on this After Visit Summary.  MyChart is used to connect with patients for Virtual Visits (Telemedicine).  Patients are able to view lab/test results, encounter notes, upcoming appointments, etc.  Non-urgent messages can be sent to your provider as well.   To learn more about what you can do with MyChart, go to [email or web address].    Your next appointment:   6 month(s)  The format for your next appointment:   In Person  Provider:   Lennie OdorWesley O'Neal, MD       Time Spent with Patient: I have spent a total of 25 minutes with patient reviewing hospital notes, telemetry, EKGs, labs and examining the patient as well as establishing an assessment and plan that was discussed with the patient.  > 50% of time was spent in direct patient care.  Signed, Lenna GilfordWesley T. Flora Lipps'Neal, MD [facility]  [facility]  [address], [address] [place], [place] [address] [phone]  01/30/2020 4:49 PM

## 2020-01-30 NOTE — Patient Instructions (Signed)
Medication Instructions:  Stop Brilinta- November 15th  *If you need a refill on your cardiac medications before your next appointment, please call your pharmacy*   Follow-Up: At [facility], you and your health needs are our priority.  As part of our continuing mission to provide you with exceptional heart care, we have created designated Provider Care Teams.  These Care Teams include your primary Cardiologist (physician) and Advanced Practice Providers (APPs -  Physician Assistants and Nurse Practitioners) who all work together to provide you with the care you need, when you need it.  We recommend signing up for the patient portal called "MyChart".  Sign up information is provided on this After Visit Summary.  MyChart is used to connect with patients for Virtual Visits (Telemedicine).  Patients are able to view lab/test results, encounter notes, upcoming appointments, etc.  Non-urgent messages can be sent to your provider as well.   To learn more about what you can do with MyChart, go to [email or web address].    Your next appointment:   6 month(s)  The format for your next appointment:   In Person  Provider:   Lennie Odor, MD

## 2020-02-22 ENCOUNTER — Other Ambulatory Visit: Payer: Self-pay | Admitting: Cardiovascular Disease

## 2020-03-24 ENCOUNTER — Other Ambulatory Visit (INDEPENDENT_AMBULATORY_CARE_PROVIDER_SITE_OTHER): Payer: Managed Care, Other (non HMO)

## 2020-03-24 DIAGNOSIS — Z0000 Encounter for general adult medical examination without abnormal findings: Secondary | ICD-10-CM | POA: Diagnosis not present

## 2020-03-24 DIAGNOSIS — Z125 Encounter for screening for malignant neoplasm of prostate: Secondary | ICD-10-CM

## 2020-03-24 LAB — CBC WITH DIFFERENTIAL/PLATELET
Basophils Absolute: 0.1 10*3/uL (ref 0.0–0.1)
Basophils Relative: 0.8 % (ref 0.0–3.0)
Eosinophils Absolute: 0.2 10*3/uL (ref 0.0–0.7)
Eosinophils Relative: 2.4 % (ref 0.0–5.0)
HCT: 38.8 % — ABNORMAL LOW (ref 39.0–52.0)
Hemoglobin: 13.2 g/dL (ref 13.0–17.0)
Lymphocytes Relative: 28 % (ref 12.0–46.0)
Lymphs Abs: 2.7 10*3/uL (ref 0.7–4.0)
MCHC: 34 g/dL (ref 30.0–36.0)
MCV: 91.4 fl (ref 78.0–100.0)
Monocytes Absolute: 0.8 10*3/uL (ref 0.1–1.0)
Monocytes Relative: 8 % (ref 3.0–12.0)
Neutro Abs: 5.8 10*3/uL (ref 1.4–7.7)
Neutrophils Relative %: 60.8 % (ref 43.0–77.0)
Platelets: 195 10*3/uL (ref 150.0–400.0)
RBC: 4.24 Mil/uL (ref 4.22–5.81)
RDW: 13.7 % (ref 11.5–15.5)
WBC: 9.6 10*3/uL (ref 4.0–10.5)

## 2020-03-24 LAB — COMPREHENSIVE METABOLIC PANEL
ALT: 26 U/L (ref 0–53)
AST: 20 U/L (ref 0–37)
Albumin: 4.3 g/dL (ref 3.5–5.2)
Alkaline Phosphatase: 58 U/L (ref 39–117)
BUN: 20 mg/dL (ref 6–23)
CO2: 29 mEq/L (ref 19–32)
Calcium: 9.2 mg/dL (ref 8.4–10.5)
Chloride: 104 mEq/L (ref 96–112)
Creatinine, Ser: 1.16 mg/dL (ref 0.40–1.50)
GFR: 67.48 mL/min (ref >60.00)
Glucose, Bld: 89 mg/dL (ref 70–99)
Potassium: 3.9 mEq/L (ref 3.5–5.1)
Sodium: 139 mEq/L (ref 135–145)
Total Bilirubin: 0.3 mg/dL (ref 0.2–1.2)
Total Protein: 6.5 g/dL (ref 6.0–8.3)

## 2020-03-24 LAB — LIPID PANEL
Cholesterol: 110 mg/dL (ref 0–200)
HDL: 38.6 mg/dL — ABNORMAL LOW (ref >39.00)
NonHDL: 71
Total CHOL/HDL Ratio: 3
Triglycerides: 223 mg/dL — ABNORMAL HIGH (ref 0.0–149.0)
VLDL: 44.6 mg/dL — ABNORMAL HIGH (ref 0.0–40.0)

## 2020-03-24 LAB — LDL CHOLESTEROL, DIRECT: Direct LDL: 51 mg/dL

## 2020-03-24 LAB — PSA: PSA: 0.75 ng/mL (ref 0.10–4.00)

## 2020-03-24 LAB — TSH: TSH: 2.16 u[IU]/mL (ref 0.35–4.50)

## 2020-03-24 NOTE — Addendum Note (Signed)
Addended by: Vincenza Hews on: 03/24/2020 04:39 PM   Modules accepted: Orders

## 2020-03-25 LAB — URINALYSIS
Bilirubin Urine: NEGATIVE
Hgb urine dipstick: NEGATIVE
Ketones, ur: NEGATIVE
Leukocytes,Ua: NEGATIVE
Nitrite: NEGATIVE
Specific Gravity, Urine: >=1.03 — AB (ref 1.000–1.030)
Total Protein, Urine: NEGATIVE
Urine Glucose: NEGATIVE
Urobilinogen, UA: 0.2 (ref 0.0–1.0)
pH: 6 (ref 5.0–8.0)

## 2020-04-04 ENCOUNTER — Other Ambulatory Visit: Payer: Self-pay | Admitting: Internal Medicine

## 2020-04-06 ENCOUNTER — Ambulatory Visit (INDEPENDENT_AMBULATORY_CARE_PROVIDER_SITE_OTHER): Payer: Managed Care, Other (non HMO) | Admitting: Internal Medicine

## 2020-04-06 ENCOUNTER — Encounter: Payer: Self-pay | Admitting: Internal Medicine

## 2020-04-06 ENCOUNTER — Other Ambulatory Visit: Payer: Self-pay

## 2020-04-06 VITALS — BP 124/72 | HR 64 | Temp 98.8°F | Wt 196.2 lb

## 2020-04-06 DIAGNOSIS — N401 Enlarged prostate with lower urinary tract symptoms: Secondary | ICD-10-CM | POA: Diagnosis not present

## 2020-04-06 DIAGNOSIS — I2583 Coronary atherosclerosis due to lipid rich plaque: Secondary | ICD-10-CM

## 2020-04-06 DIAGNOSIS — E785 Hyperlipidemia, unspecified: Secondary | ICD-10-CM | POA: Diagnosis not present

## 2020-04-06 DIAGNOSIS — M2550 Pain in unspecified joint: Secondary | ICD-10-CM | POA: Diagnosis not present

## 2020-04-06 DIAGNOSIS — Z23 Encounter for immunization: Secondary | ICD-10-CM | POA: Diagnosis not present

## 2020-04-06 DIAGNOSIS — I10 Essential (primary) hypertension: Secondary | ICD-10-CM

## 2020-04-06 DIAGNOSIS — I63429 Cerebral infarction due to embolism of unspecified anterior cerebral artery: Secondary | ICD-10-CM

## 2020-04-06 DIAGNOSIS — I2510 Atherosclerotic heart disease of native coronary artery without angina pectoris: Secondary | ICD-10-CM

## 2020-04-06 DIAGNOSIS — D751 Secondary polycythemia: Secondary | ICD-10-CM

## 2020-04-06 DIAGNOSIS — N400 Enlarged prostate without lower urinary tract symptoms: Secondary | ICD-10-CM | POA: Insufficient documentation

## 2020-04-06 DIAGNOSIS — R350 Frequency of micturition: Secondary | ICD-10-CM

## 2020-04-06 MED ORDER — TADALAFIL 5 MG PO TABS: 5.0000 mg | ORAL_TABLET | Freq: Every day | ORAL | 3 refills | Status: DC

## 2020-04-06 MED ORDER — TRAMADOL HCL 50 MG PO TABS: 50.0000 mg | ORAL_TABLET | Freq: Four times a day (QID) | ORAL | 3 refills | Status: DC | PRN

## 2020-04-06 NOTE — Assessment & Plan Note (Signed)
Lipitor 

## 2020-04-06 NOTE — Patient Instructions (Signed)
You can try Lion's Mane Mushroom extract or capsules for memory   

## 2020-04-06 NOTE — Assessment & Plan Note (Signed)
Worse. Try cialis daily. Not on NTG.

## 2020-04-06 NOTE — Assessment & Plan Note (Signed)
Chronic Tramadol prn  Potential benefits of a long term opioids use as well as potential risks (i.e. addiction risk, apnea etc) and complications (i.e. Somnolence, constipation and others) were explained to the patient and were aknowledged.  

## 2020-04-06 NOTE — Progress Notes (Signed)
Subjective:  Patient ID: Jonathon Griffin, male    DOB: 02-19-1956  Age: 64 y.o. MRN: [id number]  CC: Follow-up (3 month f/u)   HPI ENGLISH CRAIGHEAD presents for CAD, OA, dyslipidemia f/u C/o nocturia  Outpatient Medications Prior to Visit  Medication Sig Dispense Refill  . aspirin EC 81 MG tablet Take 1 tablet (81 mg total) by mouth daily. 90 tablet 3  . atorvastatin (LIPITOR) 80 MG tablet Take 1 tablet (80 mg total) by mouth daily at 6 PM. 90 tablet 3  . Cholecalciferol (VITAMIN D3) 50 MCG (2000 UT) capsule Take 1 capsule (2,000 Units total) by mouth daily. 100 capsule 3  . famotidine (PEPCID) 20 MG tablet TAKE 1 TABLET BY MOUTH TWICE A DAY 180 tablet 2  . lamoTRIgine (LAMICTAL) 100 MG tablet TAKE 1 TABLET BY MOUTH EVERY DAY 90 tablet 1  . metoprolol succinate (TOPROL-XL) 25 MG 24 hr tablet Take 1 tablet (25 mg total) by mouth daily. 90 tablet 3  . traMADol (ULTRAM) 50 MG tablet Take 1 tablet (50 mg total) by mouth every 6 (six) hours as needed for severe pain. 90 tablet 3  . traZODone (DESYREL) 50 MG tablet Take 1-2 tablets (50-100 mg total) by mouth at bedtime as needed for sleep. 180 tablet 1  . BRILINTA 90 MG TABS tablet Take 90 mg by mouth 2 (two) times daily. (Patient not taking: Reported on 04/06/2020)     No facility-administered medications prior to visit.    ROS: Review of Systems  Constitutional: Negative for appetite change, fatigue and unexpected weight change.  HENT: Negative for congestion, nosebleeds, sneezing, sore throat and trouble swallowing.   Eyes: Negative for itching and visual disturbance.  Respiratory: Negative for cough.   Cardiovascular: Negative for chest pain, palpitations and leg swelling.  Gastrointestinal: Negative for abdominal distention, blood in stool, diarrhea and nausea.  Genitourinary: Negative for frequency and hematuria.  Musculoskeletal: Positive for arthralgias. Negative for back pain, gait problem, joint swelling and neck pain.  Skin:  Negative for rash.  Neurological: Negative for dizziness, tremors, speech difficulty and weakness.  Psychiatric/Behavioral: Negative for agitation, dysphoric mood, sleep disturbance and suicidal ideas. The patient is not nervous/anxious.     Objective:  BP 124/72 (BP Location: Left Arm)   Pulse 64   Temp 98.8 F (37.1 C) (Oral)   Wt 196 lb 3.2 oz (89 kg)   SpO2 98%   BMI 28.15 kg/m   BP Readings from Last 3 Encounters:  04/06/20 124/72  01/30/20 124/78  01/01/20 128/70    Wt Readings from Last 3 Encounters:  04/06/20 196 lb 3.2 oz (89 kg)  01/30/20 194 lb 6.4 oz (88.2 kg)  01/01/20 191 lb (86.6 kg)    Physical Exam Constitutional:      General: He is not in acute distress.    Appearance: He is well-developed.     Comments: NAD  Eyes:     Conjunctiva/sclera: Conjunctivae normal.     Pupils: Pupils are equal, round, and reactive to light.  Neck:     Thyroid: No thyromegaly.     Vascular: No JVD.  Cardiovascular:     Rate and Rhythm: Normal rate and regular rhythm.     Heart sounds: Normal heart sounds. No murmur heard.  No friction rub. No gallop.   Pulmonary:     Effort: Pulmonary effort is normal. No respiratory distress.     Breath sounds: Normal breath sounds. No wheezing or rales.  Chest:  Chest wall: No tenderness.  Abdominal:     General: Bowel sounds are normal. There is no distension.     Palpations: Abdomen is soft. There is no mass.     Tenderness: There is no abdominal tenderness. There is no guarding or rebound.  Musculoskeletal:        General: No tenderness. Normal range of motion.     Cervical back: Normal range of motion.  Lymphadenopathy:     Cervical: No cervical adenopathy.  Skin:    General: Skin is warm and dry.     Findings: No rash.  Neurological:     Mental Status: He is alert and oriented to person, place, and time.     Cranial Nerves: No cranial nerve deficit.     Motor: No abnormal muscle tone.     Coordination: Coordination  normal.     Gait: Gait normal.     Deep Tendon Reflexes: Reflexes are normal and symmetric.  Psychiatric:        Behavior: Behavior normal.        Thought Content: Thought content normal.        Judgment: Judgment normal.     Lab Results  Component Value Date   WBC 9.6 03/24/2020   HGB 13.2 03/24/2020   HCT 38.8 (L) 03/24/2020   PLT 195.0 03/24/2020   GLUCOSE 89 03/24/2020   CHOL 110 03/24/2020   TRIG 223.0 (H) 03/24/2020   HDL 38.60 (L) 03/24/2020   LDLDIRECT 51.0 03/24/2020   LDLCALC 56 06/09/2019   ALT 26 03/24/2020   AST 20 03/24/2020   NA 139 03/24/2020   K 3.9 03/24/2020   CL 104 03/24/2020   CREATININE 1.16 03/24/2020   BUN 20 03/24/2020   CO2 29 03/24/2020   TSH 2.16 03/24/2020   PSA 0.75 03/24/2020   INR 2.3 (H) 05/14/2019    ECHOCARDIOGRAM COMPLETE  Result Date: 05/28/2019   ECHOCARDIOGRAM REPORT   Patient Name:   Jonathon Griffin Date of Exam: 05/28/2019 Medical Rec #:  [id number]       Height:       70.0 in Accession #:    [id number]      Weight:       178.0 lb Date of Birth:  01/02/1956       BSA:          1.99 m Patient Age:    63 years        BP:           130/79 mmHg Patient Gender: M               HR:           82 bpm. [facility] Procedure: 2D Echo, Cardiac Doppler and Color Doppler Indications:    CAD  History:        Patient has no prior history of Echocardiogram examinations.                 Risk Factors:Hypertension and Dyslipidemia. H/O Acute ST                 elevation myocardial infarction (STEMI) ,Acute on chronic                 respiratory failure with hypoxia,Alcohol abuse, in                 remission,Cardiac arrest.  Sonographer:    Celesta Gentile RCS Referring Phys: [id number] Ronnald Ramp O'NEAL IMPRESSIONS  1. Left ventricular ejection fraction, by visual estimation, is 50 to 55%. The left ventricle has low normal function. There is no left ventricular hypertrophy.  2. Basal inferoseptal segment, basal inferolateral segment, basal  inferior segment, and mid inferolateral segment are abnormal.  3. Left ventricular diastolic parameters are indeterminate.  4. The left ventricle demonstrates regional wall motion abnormalities.  5. Global right ventricle has low normal systolic function.The right ventricular size is normal. No increase in right ventricular wall thickness.  6. Left atrial size was normal.  7. Right atrial size was normal.  8. The mitral valve is grossly normal. Trivial mitral valve regurgitation.  9. The tricuspid valve is grossly normal. 10. The aortic valve is tricuspid. Aortic valve regurgitation is trivial. 11. The pulmonic valve was grossly normal. Pulmonic valve regurgitation is trivial. 12. TR signal is inadequate for assessing pulmonary artery systolic pressure. 13. The inferior vena cava is normal in size with greater than 50% respiratory variability, suggesting right atrial pressure of 3 mmHg. FINDINGS  Left Ventricle: Left ventricular ejection fraction, by visual estimation, is 50 to 55%. The left ventricle has low normal function. The left ventricle demonstrates regional wall motion abnormalities. The left ventricular internal cavity size was the left ventricle is normal in size. There is no left ventricular hypertrophy. Left ventricular diastolic parameters are indeterminate.  LV Wall Scoring: The basal inferoseptal segment, basal inferolateral segment, and basal inferior segment are akinetic. The mid inferolateral segment is hypokinetic. All remaining scored segments are normal. Right Ventricle: The right ventricular size is normal. No increase in right ventricular wall thickness. Global RV systolic function is has low normal systolic function. Left Atrium: Left atrial size was normal in size. Right Atrium: Right atrial size was normal in size Pericardium: There is no evidence of pericardial effusion. Mitral Valve: The mitral valve is grossly normal. Trivial mitral valve regurgitation. Tricuspid Valve: The tricuspid  valve is grossly normal. Tricuspid valve regurgitation is trivial. Aortic Valve: The aortic valve is tricuspid. Aortic valve regurgitation is trivial. Mild aortic valve annular calcification. Pulmonic Valve: The pulmonic valve was grossly normal. Pulmonic valve regurgitation is trivial. Pulmonic regurgitation is trivial. Aorta: The aortic root is normal in size and structure. Venous: The inferior vena cava is normal in size with greater than 50% respiratory variability, suggesting right atrial pressure of 3 mmHg. IAS/Shunts: No atrial level shunt detected by color flow Doppler.  LEFT VENTRICLE PLAX 2D LVIDd:         4.95 cm       Diastology LVIDs:         3.47 cm       LV e' lateral:   7.72 cm/s LV PW:         1.01 cm       LV E/e' lateral: 6.5 LV IVS:        0.85 cm       LV e' medial:    8.70 cm/s LVOT diam:     2.10 cm       LV E/e' medial:  5.8 LV SV:         66 ml LV SV Index:   32.73 LVOT Area:     3.46 cm  LV Volumes (MOD) LV area d, A2C:    30.90 cm LV area d, A4C:    32.80 cm LV area s, A2C:    17.50 cm LV area s, A4C:    19.90 cm LV major d, A2C:   8.45 cm LV major  d, A4C:   7.98 cm LV major s, A2C:   7.05 cm LV major s, A4C:   7.16 cm LV vol d, MOD A2C: 95.2 ml LV vol d, MOD A4C: 111.0 ml LV vol s, MOD A2C: 38.4 ml LV vol s, MOD A4C: 46.6 ml LV SV MOD A2C:     56.8 ml LV SV MOD A4C:     111.0 ml LV SV MOD BP:      63.2 ml RIGHT VENTRICLE RV S prime:     11.70 cm/s TAPSE (M-mode): 1.5 cm LEFT ATRIUM             Index       RIGHT ATRIUM           Index LA diam:        2.60 cm 1.31 cm/m  RA Area:     15.40 cm LA Vol (A2C):   52.8 ml 26.58 ml/m RA Volume:   43.00 ml  21.65 ml/m LA Vol (A4C):   32.1 ml 16.16 ml/m LA Biplane Vol: 41.7 ml 20.99 ml/m  AORTIC VALVE LVOT Vmax:   128.00 cm/s LVOT Vmean:  75.900 cm/s LVOT VTI:    0.199 m  AORTA Ao Root diam: 3.30 cm MITRAL VALVE MV Area (PHT): 2.73 cm             SHUNTS MV PHT:        80.62 msec           Systemic VTI:  0.20 m MV Decel Time: 278 msec              Systemic Diam: 2.10 cm MV E velocity: 50.30 cm/s 103 cm/s MV A velocity: 80.60 cm/s 70.3 cm/s MV E/A ratio:  0.62       1.5  Nona Dell MD Electronically signed by Nona Dell MD Signature Date/Time: 05/28/2019/4:40:02 PM    Final     Assessment & Plan:   Sonda Primes, MD

## 2020-04-06 NOTE — Assessment & Plan Note (Signed)
CBC

## 2020-04-12 NOTE — Assessment & Plan Note (Signed)
Take Lion's mane 

## 2020-04-12 NOTE — Assessment & Plan Note (Signed)
On Lipitor, aspirin, metoprolol, Brilinta

## 2020-04-12 NOTE — Assessment & Plan Note (Signed)
BP Readings from Last 3 Encounters:  04/06/20 124/72  01/30/20 124/78  01/01/20 128/70

## 2020-04-23 ENCOUNTER — Telehealth: Payer: Self-pay | Admitting: *Deleted

## 2020-04-23 NOTE — Telephone Encounter (Signed)
Rec'd PA for Tadaladil 5 mg. Completed via cover-my-meds w/ (Key: [id number]). Rec'd msg stating " Your information has been submitted to [employer]. To check for an updated outcome later".Marland KitchenRaechel Chute

## 2020-04-26 NOTE — Telephone Encounter (Signed)
Rec'd fax " Notice of Approval " med has been approved for the time period 04/23/20 - 04/24/2023. Letter has also been mailed to the pt w/notification.Marland KitchenRaechel Griffin

## 2020-05-24 ENCOUNTER — Other Ambulatory Visit: Payer: Self-pay | Admitting: Internal Medicine

## 2020-07-29 NOTE — Progress Notes (Signed)
Cardiology Office Note:   Date:  07/30/2020  NAME:  Jonathon Griffin    MRN: [id number] DOB:  1956-04-29   PCP:  Tresa Garter, MD  Cardiologist:  Reatha Harps, MD  Electrophysiologist:  None   Referring MD: Tresa Garter, MD   Chief Complaint  Patient presents with  . Coronary Artery Disease    History of Present Illness:   Jonathon Griffin is a 65 y.o. male with a hx of CAD (VF arrest 03/2019 in [place], s/p PCI to RCA), CVA, HLD who presents for follow-up.  He reports he is doing well.  He denies any chest pain or shortness of breath.  Still working full-time as a Psychologist, occupational.  No concerning symptoms at all.  His blood pressure is 130/72.  He remains on metoprolol.  I have recommended this for 3 years.  His most recent lipid profile shows he is not at goal.  However this was not fasting.  His lipid profile a year before was within limits.  I think it is safe to say his cholesterol is at goal.  I recommended to continue with statin therapy.   Problem List 1. Cardiac Arrest 04/06/2019 2/2 inferior STEMI [facility]) -s/p PCI to RCA -course c/b aspiration PNA, acute liver injury, resp failure requiring trach, protein deficiency requiring PEG 2. Multiple strokes (subcortical R occipital lobe/R cerebellar) -in setting of cardiac arrest -negative monitor for Afib 08/25/2019 3. Hyperlipidemia -T chol 119, HDL 39, LDL 56, TG 140  Past Medical History: Past Medical History:  Diagnosis Date  . Acute on chronic respiratory failure with hypoxia (HCC)   . Acute ST elevation myocardial infarction (STEMI) (HCC)   . Anxiety   . Arthritis   . Aspiration pneumonia due to gastric secretions (HCC)   . Cardiac arrest (HCC)   . Coronary artery disease   . Depression   . Embolic stroke involving anterior cerebral artery (HCC)   . Hyperlipidemia   . Hypertension   . Obstructive sleep apnea   . Sleep apnea     Past Surgical History: Past Surgical History:  Procedure  Laterality Date  . bicep surgery    . CARDIAC CATHETERIZATION    . PEG PLACEMENT    . TRACHEOSTOMY      Current Medications: Current Meds  Medication Sig  . aspirin EC 81 MG tablet Take 1 tablet (81 mg total) by mouth daily.  Marland Kitchen atorvastatin (LIPITOR) 80 MG tablet Take 1 tablet (80 mg total) by mouth daily at 6 PM.  . Cholecalciferol (VITAMIN D3) 50 MCG (2000 UT) capsule Take 1 capsule (2,000 Units total) by mouth daily.  . famotidine (PEPCID) 20 MG tablet TAKE 1 TABLET BY MOUTH TWICE A DAY  . lamoTRIgine (LAMICTAL) 100 MG tablet TAKE 1 TABLET BY MOUTH EVERY DAY  . metoprolol succinate (TOPROL-XL) 25 MG 24 hr tablet Take 1 tablet (25 mg total) by mouth daily.  . tadalafil (CIALIS) 5 MG tablet Take 1 tablet (5 mg total) by mouth daily.  . traMADol (ULTRAM) 50 MG tablet TAKE 1 TABLET (50 MG TOTAL) BY MOUTH EVERY 6 (SIX) HOURS AS NEEDED FOR SEVERE PAIN.  . traZODone (DESYREL) 50 MG tablet Take 1-2 tablets (50-100 mg total) by mouth at bedtime as needed for sleep.     Allergies:    Oxycodone-acetaminophen and Wellbutrin [bupropion]   Social History: Social History   Socioeconomic History  . Marital status: Married    Spouse name: Not on file  .  Number of children: Not on file  . Years of education: Not on file  . Highest education level: Not on file  Occupational History  . Occupation: welder  Tobacco Use  . Smoking status: Never Smoker  . Smokeless tobacco: Never Used  Substance and Sexual Activity  . Alcohol use: No  . Drug use: No  . Sexual activity: Yes  Other Topics Concern  . Not on file  Social History Narrative  . Not on file   Social Determinants of Health   Financial Resource Strain: Not on file  Food Insecurity: Not on file  Transportation Needs: Not on file  Physical Activity: Not on file  Stress: Not on file  Social Connections: Not on file     Family History: The patient's family history includes Arthritis in his mother and sister.  ROS:   All  other ROS reviewed and negative. Pertinent positives noted in the HPI.     EKGs/Labs/Other Studies Reviewed:   The following studies were personally reviewed by me today:  TTE 05/28/2019 1. Left ventricular ejection fraction, by visual estimation, is 50 to  55%. The left ventricle has low normal function. There is no left  ventricular hypertrophy.  2. Basal inferoseptal segment, basal inferolateral segment, basal  inferior segment, and mid inferolateral segment are abnormal.  3. Left ventricular diastolic parameters are indeterminate.  4. The left ventricle demonstrates regional wall motion abnormalities.  5. Global right ventricle has low normal systolic function.The right  ventricular size is normal. No increase in right ventricular wall  thickness.  6. Left atrial size was normal.  7. Right atrial size was normal.  8. The mitral valve is grossly normal. Trivial mitral valve  regurgitation.  9. The tricuspid valve is grossly normal.  10. The aortic valve is tricuspid. Aortic valve regurgitation is trivial.  11. The pulmonic valve was grossly normal. Pulmonic valve regurgitation is  trivial.  12. TR signal is inadequate for assessing pulmonary artery systolic  pressure.  13. The inferior vena cava is normal in size with greater than 50%  respiratory variability, suggesting right atrial pressure of 3 mmHg.  Zio 08/25/2019 1. Brief ectopic atrial tachycardia episodes (15 in 14 day wear period) with longest 20.5 seconds in duration.  2. No atrial fibrillation.  3. Rare ectopy.   Recent Labs: 03/24/2020: ALT 26; BUN 20; Creatinine, Ser 1.16; Hemoglobin 13.2; Platelets 195.0; Potassium 3.9; Sodium 139; TSH 2.16   Recent Lipid Panel    Component Value Date/Time   CHOL 110 03/24/2020 1641   CHOL 119 06/09/2019 1021   TRIG 223.0 (H) 03/24/2020 1641   HDL 38.60 (L) 03/24/2020 1641   HDL 39 (L) 06/09/2019 1021   CHOLHDL 3 03/24/2020 1641   VLDL 44.6 (H) 03/24/2020 1641    LDLCALC 56 06/09/2019 1021   LDLDIRECT 51.0 03/24/2020 1641    Physical Exam:   VS:  BP 130/72   Pulse 64   Ht 5\' 10"  (1.778 m)   Wt 202 lb (91.6 kg)   SpO2 98%   BMI 28.98 kg/m    Wt Readings from Last 3 Encounters:  07/30/20 202 lb (91.6 kg)  04/06/20 196 lb 3.2 oz (89 kg)  01/30/20 194 lb 6.4 oz (88.2 kg)    General: Well nourished, well developed, in no acute distress Head: Atraumatic, normal size  Eyes: PEERLA, EOMI  Neck: Supple, no JVD Endocrine: No thryomegaly Cardiac: Normal S1, S2; RRR; no murmurs, rubs, or gallops Lungs: Clear to auscultation bilaterally, no  wheezing, rhonchi or rales  Abd: Soft, nontender, no hepatomegaly  Ext: No edema, pulses 2+ Musculoskeletal: No deformities, BUE and BLE strength normal and equal Skin: Warm and dry, no rashes   Neuro: Alert and oriented to person, place, time, and situation, CNII-XII grossly intact, no focal deficits  Psych: Normal mood and affect   ASSESSMENT:   Jonathon Griffin is a 65 y.o. male who presents for the following: 1. Coronary artery disease involving native coronary artery of native heart without angina pectoris   2. Cardiac arrest (HCC)   3. Mixed hyperlipidemia   4. Essential hypertension     PLAN:   1. Coronary artery disease involving native coronary artery of native heart without angina pectoris 2. Cardiac arrest (HCC) 3. Mixed hyperlipidemia 4. Essential hypertension -He suffered an out of hospital cardiac arrest on 04/06/2019 secondary to inferior STEMI in [facility].  He had an extended hospitalization that required LTAC placement.  He is recovered from tracheostomy as well as a feeding tube.  He is done remarkably well. -Echo shows EF is normal.  He has no symptoms of heart failure. -He has completed 1 year of DAPT.  He will remain on aspirin indefinitely. -Blood pressure acceptable. -He is on Lipitor 80 mg daily.  Values are acceptable.  I recommend no change in medications at this  time.  Disposition: Return in about 1 year (around 07/30/2021).  Medication Adjustments/Labs and Tests Ordered: Current medicines are reviewed at length with the patient today.  Concerns regarding medicines are outlined above.  No orders of the defined types were placed in this encounter.  No orders of the defined types were placed in this encounter.   Patient Instructions  Medication Instructions:  Your physician recommends that you continue on your current medications as directed. Please refer to the Current Medication list given to you today.  *If you need a refill on your cardiac medications before your next appointment, please call your pharmacy*  Lab Work: NONE ordered at this time of appointment   If you have labs (blood work) drawn today and your tests are completely normal, you will receive your results only by: Marland Kitchen MyChart Message (if you have MyChart) OR . A paper copy in the mail If you have any lab test that is abnormal or we need to change your treatment, we will call you to review the results.  Testing/Procedures: NONE ordered at this time of appointment   Follow-Up: At [facility], you and your health needs are our priority.  As part of our continuing mission to provide you with exceptional heart care, we have created designated Provider Care Teams.  These Care Teams include your primary Cardiologist (physician) and Advanced Practice Providers (APPs -  Physician Assistants and Nurse Practitioners) who all work together to provide you with the care you need, when you need it.  We recommend signing up for the patient portal called "MyChart".  Sign up information is provided on this After Visit Summary.  MyChart is used to connect with patients for Virtual Visits (Telemedicine).  Patients are able to view lab/test results, encounter notes, upcoming appointments, etc.  Non-urgent messages can be sent to your provider as well.   To learn more about what you can do with  MyChart, go to [email or web address].    Your next appointment:   1 year(s)  The format for your next appointment:   In Person  Provider:   Lennie Odor, MD  Other Instructions  Time Spent with Patient: I have spent a total of 25 minutes with patient reviewing hospital notes, telemetry, EKGs, labs and examining the patient as well as establishing an assessment and plan that was discussed with the patient.  > 50% of time was spent in direct patient care.  Signed, Lenna Gilford. Flora Lipps, MD, [facility]  [facility]  [address], [address] [place], [place] [address] [phone]  07/30/2020 5:04 PM

## 2020-07-30 ENCOUNTER — Other Ambulatory Visit: Payer: Self-pay

## 2020-07-30 ENCOUNTER — Ambulatory Visit (INDEPENDENT_AMBULATORY_CARE_PROVIDER_SITE_OTHER): Payer: Managed Care, Other (non HMO) | Admitting: Cardiovascular Disease

## 2020-07-30 ENCOUNTER — Encounter: Payer: Self-pay | Admitting: Cardiovascular Disease

## 2020-07-30 VITALS — BP 130/72 | HR 64 | Ht 70.0 in | Wt 202.0 lb

## 2020-07-30 DIAGNOSIS — E782 Mixed hyperlipidemia: Secondary | ICD-10-CM | POA: Diagnosis not present

## 2020-07-30 DIAGNOSIS — I10 Essential (primary) hypertension: Secondary | ICD-10-CM | POA: Diagnosis not present

## 2020-07-30 DIAGNOSIS — I2510 Atherosclerotic heart disease of native coronary artery without angina pectoris: Secondary | ICD-10-CM

## 2020-07-30 DIAGNOSIS — I469 Cardiac arrest, cause unspecified: Secondary | ICD-10-CM | POA: Diagnosis not present

## 2020-07-30 NOTE — Patient Instructions (Signed)
Medication Instructions:  Your physician recommends that you continue on your current medications as directed. Please refer to the Current Medication list given to you today.  *If you need a refill on your cardiac medications before your next appointment, please call your pharmacy*  Lab Work: NONE ordered at this time of appointment   If you have labs (blood work) drawn today and your tests are completely normal, you will receive your results only by: Marland Kitchen MyChart Message (if you have MyChart) OR . A paper copy in the mail If you have any lab test that is abnormal or we need to change your treatment, we will call you to review the results.  Testing/Procedures: NONE ordered at this time of appointment   Follow-Up: At [facility], you and your health needs are our priority.  As part of our continuing mission to provide you with exceptional heart care, we have created designated Provider Care Teams.  These Care Teams include your primary Cardiologist (physician) and Advanced Practice Providers (APPs -  Physician Assistants and Nurse Practitioners) who all work together to provide you with the care you need, when you need it.  We recommend signing up for the patient portal called "MyChart".  Sign up information is provided on this After Visit Summary.  MyChart is used to connect with patients for Virtual Visits (Telemedicine).  Patients are able to view lab/test results, encounter notes, upcoming appointments, etc.  Non-urgent messages can be sent to your provider as well.   To learn more about what you can do with MyChart, go to [email or web address].    Your next appointment:   1 year(s)  The format for your next appointment:   In Person  Provider:   Lennie Odor, MD  Other Instructions

## 2020-08-03 ENCOUNTER — Ambulatory Visit: Payer: Managed Care, Other (non HMO) | Admitting: Internal Medicine

## 2020-08-16 ENCOUNTER — Ambulatory Visit: Payer: Managed Care, Other (non HMO) | Admitting: Internal Medicine

## 2020-08-18 ENCOUNTER — Other Ambulatory Visit: Payer: Self-pay

## 2020-08-19 ENCOUNTER — Encounter: Payer: Self-pay | Admitting: Internal Medicine

## 2020-08-19 ENCOUNTER — Ambulatory Visit (INDEPENDENT_AMBULATORY_CARE_PROVIDER_SITE_OTHER): Payer: Managed Care, Other (non HMO) | Admitting: Internal Medicine

## 2020-08-19 DIAGNOSIS — I10 Essential (primary) hypertension: Secondary | ICD-10-CM | POA: Diagnosis not present

## 2020-08-19 DIAGNOSIS — G4700 Insomnia, unspecified: Secondary | ICD-10-CM

## 2020-08-19 DIAGNOSIS — I2510 Atherosclerotic heart disease of native coronary artery without angina pectoris: Secondary | ICD-10-CM

## 2020-08-19 DIAGNOSIS — K921 Melena: Secondary | ICD-10-CM

## 2020-08-19 DIAGNOSIS — I2583 Coronary atherosclerosis due to lipid rich plaque: Secondary | ICD-10-CM

## 2020-08-19 MED ORDER — TRIAMCINOLONE ACETONIDE 0.1 % EX OINT: 1.0000 "application " | TOPICAL_OINTMENT | Freq: Two times a day (BID) | CUTANEOUS | 1 refills | Status: DC

## 2020-08-19 MED ORDER — ZOLPIDEM TARTRATE 10 MG PO TABS
5.0000 mg | ORAL_TABLET | Freq: Every evening | ORAL | 0 refills | Status: DC | PRN
Start: 2020-08-19 — End: 2020-12-23

## 2020-08-19 NOTE — Progress Notes (Signed)
Subjective:  Patient ID: Jonathon Griffin, male    DOB: [birth year]  Age: 65 y.o. MRN: [id number]  CC: Follow-up (4 months and per patient he wants to switch from Trazodone to Ambien)   HPI [name] presents for blood on toilet paper x 2 months  F/u CAD, HTN C/o insomnia  Outpatient Medications Prior to Visit  Medication Sig Dispense Refill  . aspirin EC 81 MG tablet Take 1 tablet (81 mg total) by mouth daily. 90 tablet 3  . atorvastatin (LIPITOR) 80 MG tablet Take 1 tablet (80 mg total) by mouth daily at 6 PM. 90 tablet 3  . Cholecalciferol (VITAMIN D3) 50 MCG (2000 UT) capsule Take 1 capsule (2,000 Units total) by mouth daily. 100 capsule 3  . famotidine (PEPCID) 20 MG tablet TAKE 1 TABLET BY MOUTH TWICE A DAY 180 tablet 2  . lamoTRIgine (LAMICTAL) 100 MG tablet TAKE 1 TABLET BY MOUTH EVERY DAY 90 tablet 1  . metoprolol succinate (TOPROL-XL) 25 MG 24 hr tablet Take 1 tablet (25 mg total) by mouth daily. 90 tablet 3  . tadalafil (CIALIS) 5 MG tablet Take 1 tablet (5 mg total) by mouth daily. 90 tablet 3  . traMADol (ULTRAM) 50 MG tablet TAKE 1 TABLET (50 MG TOTAL) BY MOUTH EVERY 6 (SIX) HOURS AS NEEDED FOR SEVERE PAIN. 90 tablet 3  . traZODone (DESYREL) 50 MG tablet Take 1-2 tablets (50-100 mg total) by mouth at bedtime as needed for sleep. 180 tablet 1   No facility-administered medications prior to visit.    ROS: Review of Systems  Constitutional: Negative for appetite change, fatigue and unexpected weight change.  HENT: Negative for congestion, nosebleeds, sneezing, sore throat and trouble swallowing.   Eyes: Negative for itching and visual disturbance.  Respiratory: Negative for cough.   Cardiovascular: Negative for chest pain, palpitations and leg swelling.  Gastrointestinal: Positive for blood in stool. Negative for abdominal distention, diarrhea, nausea and rectal pain.  Genitourinary: Negative for frequency and hematuria.  Musculoskeletal: Positive for arthralgias.  Negative for back pain, gait problem, joint swelling and neck pain.  Skin: Negative for rash.  Neurological: Negative for dizziness, tremors, speech difficulty and weakness.  Psychiatric/Behavioral: Positive for sleep disturbance. Negative for agitation, dysphoric mood and suicidal ideas. The patient is not nervous/anxious.     Objective:  BP 120/80 (BP Location: Right Arm, Patient Position: Sitting, Cuff Size: Normal)   Pulse 68   Temp 98.9 F (37.2 C) (Oral)   Ht 5\' 10"  (1.778 m)   Wt 202 lb (91.6 kg)   SpO2 98%   BMI 28.98 kg/m   BP Readings from Last 3 Encounters:  08/19/20 120/80  07/30/20 130/72  04/06/20 124/72    Wt Readings from Last 3 Encounters:  08/19/20 202 lb (91.6 kg)  07/30/20 202 lb (91.6 kg)  04/06/20 196 lb 3.2 oz (89 kg)    Physical Exam Constitutional:      General: He is not in acute distress.    Appearance: He is well-developed.     Comments: NAD  Eyes:     Conjunctiva/sclera: Conjunctivae normal.     Pupils: Pupils are equal, round, and reactive to light.  Neck:     Thyroid: No thyromegaly.     Vascular: No JVD.  Cardiovascular:     Rate and Rhythm: Normal rate and regular rhythm.     Heart sounds: Normal heart sounds. No murmur heard. No friction rub. No gallop.   Pulmonary:  Effort: Pulmonary effort is normal. No respiratory distress.     Breath sounds: Normal breath sounds. No wheezing or rales.  Chest:     Chest wall: No tenderness.  Abdominal:     General: Bowel sounds are normal. There is no distension.     Palpations: Abdomen is soft. There is no mass.     Tenderness: There is no abdominal tenderness. There is no guarding or rebound.  Musculoskeletal:        General: No tenderness. Normal range of motion.     Cervical back: Normal range of motion.  Lymphadenopathy:     Cervical: No cervical adenopathy.  Skin:    General: Skin is warm and dry.     Findings: No rash.  Neurological:     Mental Status: He is alert and  oriented to person, place, and time.     Cranial Nerves: No cranial nerve deficit.     Motor: No abnormal muscle tone.     Coordination: Coordination normal.     Gait: Gait normal.     Deep Tendon Reflexes: Reflexes are normal and symmetric.  Psychiatric:        Behavior: Behavior normal.        Thought Content: Thought content normal.        Judgment: Judgment normal.    Rectal - deffered to Dr Dulce Sellarutlaw   Lab Results  Component Value Date   WBC 9.6 03/24/2020   HGB 13.2 03/24/2020   HCT 38.8 (L) 03/24/2020   PLT 195.0 03/24/2020   GLUCOSE 89 03/24/2020   CHOL 110 03/24/2020   TRIG 223.0 (H) 03/24/2020   HDL 38.60 (L) 03/24/2020   LDLDIRECT 51.0 03/24/2020   LDLCALC 56 06/09/2019   ALT 26 03/24/2020   AST 20 03/24/2020   NA 139 03/24/2020   K 3.9 03/24/2020   CL 104 03/24/2020   CREATININE 1.16 03/24/2020   BUN 20 03/24/2020   CO2 29 03/24/2020   TSH 2.16 03/24/2020   PSA 0.75 03/24/2020   INR 2.3 (H) 05/14/2019    ECHOCARDIOGRAM COMPLETE  Result Date: 05/28/2019   ECHOCARDIOGRAM REPORT   Patient Name:   Jonathon Griffin Date of Exam: 05/28/2019 Medical Rec #:  [id number]       Height:       70.0 in Accession #:    [id number]      Weight:       178.0 lb Date of Birth:  1955-07-04       BSA:          1.99 m Patient Age:    63 years        BP:           130/79 mmHg Patient Gender: M               HR:           82 bpm. [name] Procedure: 2D Echo, Cardiac Doppler and Color Doppler Indications:    CAD  History:        Patient has no prior history of Echocardiogram examinations.                 Risk Factors:Hypertension and Dyslipidemia. H/O Acute ST                 elevation myocardial infarction (STEMI) ,Acute on chronic                 respiratory failure with hypoxia,Alcohol abuse,  in                 remission,Cardiac arrest.  Sonographer:    Celesta Gentile RCS Referring Phys: [id number] Ronnald Ramp O'NEAL IMPRESSIONS  1. Left ventricular ejection fraction, by visual  estimation, is 50 to 55%. The left ventricle has low normal function. There is no left ventricular hypertrophy.  2. Basal inferoseptal segment, basal inferolateral segment, basal inferior segment, and mid inferolateral segment are abnormal.  3. Left ventricular diastolic parameters are indeterminate.  4. The left ventricle demonstrates regional wall motion abnormalities.  5. Global right ventricle has low normal systolic function.The right ventricular size is normal. No increase in right ventricular wall thickness.  6. Left atrial size was normal.  7. Right atrial size was normal.  8. The mitral valve is grossly normal. Trivial mitral valve regurgitation.  9. The tricuspid valve is grossly normal. 10. The aortic valve is tricuspid. Aortic valve regurgitation is trivial. 11. The pulmonic valve was grossly normal. Pulmonic valve regurgitation is trivial. 12. TR signal is inadequate for assessing pulmonary artery systolic pressure. 13. The inferior vena cava is normal in size with greater than 50% respiratory variability, suggesting right atrial pressure of 3 mmHg. FINDINGS  Left Ventricle: Left ventricular ejection fraction, by visual estimation, is 50 to 55%. The left ventricle has low normal function. The left ventricle demonstrates regional wall motion abnormalities. The left ventricular internal cavity size was the left ventricle is normal in size. There is no left ventricular hypertrophy. Left ventricular diastolic parameters are indeterminate.  LV Wall Scoring: The basal inferoseptal segment, basal inferolateral segment, and basal inferior segment are akinetic. The mid inferolateral segment is hypokinetic. All remaining scored segments are normal. Right Ventricle: The right ventricular size is normal. No increase in right ventricular wall thickness. Global RV systolic function is has low normal systolic function. Left Atrium: Left atrial size was normal in size. Right Atrium: Right atrial size was normal in size  Pericardium: There is no evidence of pericardial effusion. Mitral Valve: The mitral valve is grossly normal. Trivial mitral valve regurgitation. Tricuspid Valve: The tricuspid valve is grossly normal. Tricuspid valve regurgitation is trivial. Aortic Valve: The aortic valve is tricuspid. Aortic valve regurgitation is trivial. Mild aortic valve annular calcification. Pulmonic Valve: The pulmonic valve was grossly normal. Pulmonic valve regurgitation is trivial. Pulmonic regurgitation is trivial. Aorta: The aortic root is normal in size and structure. Venous: The inferior vena cava is normal in size with greater than 50% respiratory variability, suggesting right atrial pressure of 3 mmHg. IAS/Shunts: No atrial level shunt detected by color flow Doppler.  LEFT VENTRICLE PLAX 2D LVIDd:         4.95 cm       Diastology LVIDs:         3.47 cm       LV e' lateral:   7.72 cm/s LV PW:         1.01 cm       LV E/e' lateral: 6.5 LV IVS:        0.85 cm       LV e' medial:    8.70 cm/s LVOT diam:     2.10 cm       LV E/e' medial:  5.8 LV SV:         66 ml LV SV Index:   32.73 LVOT Area:     3.46 cm  LV Volumes (MOD) LV area d, A2C:    30.90 cm LV area  d, A4C:    32.80 cm LV area s, A2C:    17.50 cm LV area s, A4C:    19.90 cm LV major d, A2C:   8.45 cm LV major d, A4C:   7.98 cm LV major s, A2C:   7.05 cm LV major s, A4C:   7.16 cm LV vol d, MOD A2C: 95.2 ml LV vol d, MOD A4C: 111.0 ml LV vol s, MOD A2C: 38.4 ml LV vol s, MOD A4C: 46.6 ml LV SV MOD A2C:     56.8 ml LV SV MOD A4C:     111.0 ml LV SV MOD BP:      63.2 ml RIGHT VENTRICLE RV S prime:     11.70 cm/s TAPSE (M-mode): 1.5 cm LEFT ATRIUM             Index       RIGHT ATRIUM           Index LA diam:        2.60 cm 1.31 cm/m  RA Area:     15.40 cm LA Vol (A2C):   52.8 ml 26.58 ml/m RA Volume:   43.00 ml  21.65 ml/m LA Vol (A4C):   32.1 ml 16.16 ml/m LA Biplane Vol: 41.7 ml 20.99 ml/m  AORTIC VALVE LVOT Vmax:   128.00 cm/s LVOT Vmean:  75.900 cm/s LVOT VTI:     0.199 m  AORTA Ao Root diam: 3.30 cm MITRAL VALVE MV Area (PHT): 2.73 cm             SHUNTS MV PHT:        80.62 msec           Systemic VTI:  0.20 m MV Decel Time: 278 msec             Systemic Diam: 2.10 cm MV E velocity: 50.30 cm/s 103 cm/s MV A velocity: 80.60 cm/s 70.3 cm/s MV E/A ratio:  0.62       1.5  Nona Dell MD Electronically signed by Nona Dell MD Signature Date/Time: 05/28/2019/4:40:02 PM    Final     Assessment & Plan:   Vashawn was seen today for follow-up.  Diagnoses and all orders for this visit:  Hematochezia -     Ambulatory referral to Gastroenterology     No orders of the defined types were placed in this encounter.    Follow-up: No follow-ups on file.  Sonda Primes, MD

## 2020-08-19 NOTE — Assessment & Plan Note (Signed)
Last colonoscopy 2013 Dr Dulce Sellar Will ref to Dr Dulce Sellar

## 2020-08-22 ENCOUNTER — Encounter: Payer: Self-pay | Admitting: Internal Medicine

## 2020-08-22 NOTE — Assessment & Plan Note (Signed)
No angina.  Can continue with Lipitor, aspirin, metoprolol

## 2020-08-22 NOTE — Assessment & Plan Note (Signed)
  BP Readings from Last 3 Encounters:  08/19/20 120/80  07/30/20 130/72  04/06/20 124/72

## 2020-08-22 NOTE — Assessment & Plan Note (Signed)
Zolpidem prn  Potential benefits of a long term benzodiazepines  use as well as potential risks  and complications were explained to the patient and were aknowledged. 

## 2020-08-24 ENCOUNTER — Ambulatory Visit: Payer: Managed Care, Other (non HMO) | Admitting: Internal Medicine

## 2020-08-27 ENCOUNTER — Other Ambulatory Visit: Payer: Self-pay | Admitting: Cardiovascular Disease

## 2020-08-27 ENCOUNTER — Telehealth: Payer: Self-pay | Admitting: Cardiovascular Disease

## 2020-08-27 NOTE — Telephone Encounter (Signed)
*  STAT* If patient is at the pharmacy, call can be transferred to refill team.   1. Which medications need to be refilled? (please list name of each medication and dose if known)  atorvastatin (LIPITOR) 80 MG tablet famotidine (PEPCID) 20 MG tablet metoprolol succinate (TOPROL-XL) 25 MG 24 hr tablet  2. Which pharmacy/location (including street and city if local pharmacy) is medication to be sent to? [id number] [place], [place] - [address]  3. Do they need a 30 day or 90 day supply? 90 day

## 2020-09-06 ENCOUNTER — Telehealth: Payer: Self-pay

## 2020-09-06 NOTE — Telephone Encounter (Signed)
   [facility] Pre-operative Risk Assessment    Patient Name: Jonathon Griffin  DOB: 1955-09-10  MRN: [id number]   HEARTCARE STAFF: - Please ensure there is not already an duplicate clearance open for this procedure. - Under Visit Info/Reason for Call, type in Other and utilize the format Clearance MM/DD/YY or Clearance TBD. Do not use dashes or single digits. - If request is for dental extraction, please clarify the # of teeth to be extracted.  Request for surgical clearance:  1. What type of surgery is being performed? Colonoscopy    2. When is this surgery scheduled? 09/13/20   3. What type of clearance is required (medical clearance vs. Pharmacy clearance to hold med vs. Both)? both  4. Are there any medications that need to be held prior to surgery and how long? Aspirin  5. Practice name and name of physician performing surgery? [facility] (Dr. Arta Silence)   6. What is the office phone number? [phone]   7.   What is the office fax number? [phone]  8.   Anesthesia type (None, local, MAC, general) ? unknown   Beatrix Fetters 09/06/2020, 4:31 PM  _________________________________________________________________   (provider comments below)

## 2020-09-07 ENCOUNTER — Telehealth: Payer: Self-pay | Admitting: Cardiovascular Disease

## 2020-09-07 NOTE — Telephone Encounter (Signed)
Patient returning call.

## 2020-09-07 NOTE — Telephone Encounter (Signed)
I spoke with the patient's wife, he gets off work around 5 PM today, I will call him around 5:30 PM to discuss cardiac clearance.

## 2020-09-07 NOTE — Telephone Encounter (Signed)
   Name: Jonathon Griffin  DOB: 1956/03/23  MRN: [id number]   Primary Cardiologist: Reatha Harps, MD  Chart reviewed as part of pre-operative protocol coverage. Patient was contacted 09/07/2020 in reference to pre-operative risk assessment for pending surgery as outlined below.  Jonathon Griffin was last seen on 07/30/2020 by Dr. Flora Lipps.  Since that day, Jonathon Griffin has done well without chest pain or worsening dyspnea.  Therefore, based on ACC/AHA guidelines, the patient would be at acceptable risk for the planned procedure without further cardiovascular testing.   With his prior history of cardiac arrest in 2020 and multiple strokes, he would be higher risk off of aspirin.  I spoke with Dr. Hulen Shouts nurse, who states they can do the procedure on the aspirin if needed.  The patient was advised that if he develops new symptoms prior to surgery to contact our office to arrange for a follow-up visit, and he verbalized understanding.  I will route this recommendation to the requesting party via Epic fax function and remove from pre-op pool. Please call with questions.  [name], [place] 09/07/2020, 4:58 PM

## 2020-09-07 NOTE — Telephone Encounter (Signed)
Follow up:     Patient wife calling to check the status of her husband medical clearance. Please advise

## 2020-09-21 ENCOUNTER — Other Ambulatory Visit: Payer: Self-pay | Admitting: Internal Medicine

## 2020-09-27 ENCOUNTER — Other Ambulatory Visit: Payer: Self-pay | Admitting: Internal Medicine

## 2020-11-01 ENCOUNTER — Other Ambulatory Visit: Payer: Self-pay | Admitting: Internal Medicine

## 2020-11-02 ENCOUNTER — Other Ambulatory Visit: Payer: Self-pay | Admitting: Physician Assistant

## 2020-11-02 DIAGNOSIS — R198 Other specified symptoms and signs involving the digestive system and abdomen: Secondary | ICD-10-CM

## 2020-11-04 NOTE — Telephone Encounter (Signed)
Check [place] registry last filled 09/21/2020. MD is out of the office until Monday 11/08/20. Pls advise on refill.Marland KitchenRaechel Chute

## 2020-11-04 NOTE — Telephone Encounter (Signed)
1.Medication Requested: traMADol (ULTRAM) 50 MG tablet   2. Pharmacy (Name, Street, [place]): [id number] [place], [place] - [address]   3. On Med List: yes   4. Last Visit with PCP: 08-19-20  5. Next visit date with PCP: 03-22-21   Agent: Please be advised that RX refills may take up to 3 business days. We ask that you follow-up with your pharmacy.

## 2020-11-06 ENCOUNTER — Other Ambulatory Visit: Payer: Self-pay | Admitting: Cardiovascular Disease

## 2020-11-08 ENCOUNTER — Other Ambulatory Visit: Payer: Self-pay | Admitting: Internal Medicine

## 2020-11-08 NOTE — Progress Notes (Signed)
done

## 2020-11-19 ENCOUNTER — Inpatient Hospital Stay: Admission: RE | Admit: 2020-11-19 | Payer: Managed Care, Other (non HMO) | Source: Ambulatory Visit

## 2020-12-09 ENCOUNTER — Other Ambulatory Visit: Payer: Self-pay | Admitting: Physician Assistant

## 2020-12-09 DIAGNOSIS — R198 Other specified symptoms and signs involving the digestive system and abdomen: Secondary | ICD-10-CM

## 2020-12-20 ENCOUNTER — Other Ambulatory Visit: Payer: Self-pay | Admitting: Internal Medicine

## 2020-12-21 ENCOUNTER — Other Ambulatory Visit: Payer: Self-pay | Admitting: Internal Medicine

## 2020-12-22 ENCOUNTER — Telehealth: Payer: Self-pay | Admitting: Internal Medicine

## 2020-12-22 NOTE — Telephone Encounter (Signed)
   Request to refill Tramadol

## 2020-12-24 ENCOUNTER — Other Ambulatory Visit: Payer: Self-pay | Admitting: Internal Medicine

## 2020-12-24 ENCOUNTER — Telehealth: Payer: Self-pay | Admitting: Internal Medicine

## 2020-12-24 NOTE — Telephone Encounter (Signed)
What do we need to do?  Thanks ?

## 2020-12-24 NOTE — Telephone Encounter (Signed)
1.Medication Requested: traMADol (ULTRAM) 50 MG tablet  2. Pharmacy (Name, Street, [place]): [id number] [place], [place] - [address]  Phone:  [phone] Fax:  [phone]   3. On Med List: yes  4. Last Visit with PCP: 04.05.22  5. Next visit date with PCP: 11.01.22   Agent: Please be advised that RX refills may take up to 3 business days. We ask that you follow-up with your pharmacy.

## 2020-12-24 NOTE — Telephone Encounter (Signed)
Refill request.. MD already approved.Marland KitchenRaechel Griffin

## 2020-12-26 MED ORDER — TRAMADOL HCL 50 MG PO TABS: 50.0000 mg | ORAL_TABLET | Freq: Four times a day (QID) | ORAL | 2 refills | Status: DC | PRN

## 2020-12-26 NOTE — Telephone Encounter (Signed)
Okay.  Thanks.

## 2021-01-17 NOTE — Assessment & Plan Note (Signed)
Status post cardiac arrest, MI On Lipitor, aspirin, metoprolol, Brilinta 

## 2021-01-17 NOTE — Assessment & Plan Note (Signed)
Status post cardiac arrest, MI On Lipitor, aspirin, metoprolol, Brilinta

## 2021-01-28 ENCOUNTER — Other Ambulatory Visit: Payer: Managed Care, Other (non HMO)

## 2021-01-28 ENCOUNTER — Ambulatory Visit
Admission: RE | Admit: 2021-01-28 | Discharge: 2021-01-28 | Disposition: A | Discharge status: Home / Self Care | Payer: Managed Care, Other (non HMO) | Source: Ambulatory Visit | Attending: Physician Assistant | Admitting: Physician Assistant

## 2021-01-28 DIAGNOSIS — R198 Other specified symptoms and signs involving the digestive system and abdomen: Secondary | ICD-10-CM

## 2021-01-28 MED ORDER — IOPAMIDOL (ISOVUE-370) INJECTION 76%
80.0000 mL | Freq: Once | INTRAVENOUS | Status: AC | PRN
  Administered 2021-01-28: 80 mL via INTRAVENOUS

## 2021-02-04 ENCOUNTER — Other Ambulatory Visit: Payer: Self-pay | Admitting: Physician Assistant

## 2021-02-04 DIAGNOSIS — K603 Anal fistula, unspecified: Secondary | ICD-10-CM

## 2021-02-04 DIAGNOSIS — R935 Abnormal findings on diagnostic imaging of other abdominal regions, including retroperitoneum: Secondary | ICD-10-CM

## 2021-02-20 ENCOUNTER — Other Ambulatory Visit: Payer: Self-pay

## 2021-02-20 ENCOUNTER — Ambulatory Visit
Admission: RE | Admit: 2021-02-20 | Discharge: 2021-02-20 | Disposition: A | Discharge status: Home / Self Care | Payer: Managed Care, Other (non HMO) | Source: Ambulatory Visit | Attending: Physician Assistant | Admitting: Physician Assistant

## 2021-02-20 DIAGNOSIS — K603 Anal fistula: Secondary | ICD-10-CM

## 2021-02-20 DIAGNOSIS — R935 Abnormal findings on diagnostic imaging of other abdominal regions, including retroperitoneum: Secondary | ICD-10-CM

## 2021-02-20 MED ORDER — GADOBENATE DIMEGLUMINE 529 MG/ML IV SOLN
20.0000 mL | Freq: Once | INTRAVENOUS | Status: AC | PRN
  Administered 2021-02-20: 20 mL via INTRAVENOUS

## 2021-03-07 ENCOUNTER — Ambulatory Visit: Payer: Self-pay | Admitting: Surgery

## 2021-03-22 ENCOUNTER — Encounter: Payer: Self-pay | Admitting: Internal Medicine

## 2021-03-22 ENCOUNTER — Other Ambulatory Visit: Payer: Self-pay

## 2021-03-22 ENCOUNTER — Ambulatory Visit (INDEPENDENT_AMBULATORY_CARE_PROVIDER_SITE_OTHER): Payer: Managed Care, Other (non HMO) | Admitting: Internal Medicine

## 2021-03-22 VITALS — BP 122/70 | HR 62 | Temp 98.8°F | Ht 70.0 in | Wt 209.4 lb

## 2021-03-22 DIAGNOSIS — K604 Rectal fistula: Secondary | ICD-10-CM

## 2021-03-22 DIAGNOSIS — M2550 Pain in unspecified joint: Secondary | ICD-10-CM | POA: Diagnosis not present

## 2021-03-22 DIAGNOSIS — G4700 Insomnia, unspecified: Secondary | ICD-10-CM

## 2021-03-22 DIAGNOSIS — Z0000 Encounter for general adult medical examination without abnormal findings: Secondary | ICD-10-CM | POA: Diagnosis not present

## 2021-03-22 DIAGNOSIS — I10 Essential (primary) hypertension: Secondary | ICD-10-CM | POA: Diagnosis not present

## 2021-03-22 DIAGNOSIS — K921 Melena: Secondary | ICD-10-CM

## 2021-03-22 MED ORDER — TRAZODONE HCL 50 MG PO TABS: 50.0000 mg | ORAL_TABLET | Freq: Every evening | ORAL | 1 refills | Status: DC | PRN

## 2021-03-22 MED ORDER — HIBICLENS 4 % EX LIQD: Freq: Every day | CUTANEOUS | 0 refills | Status: DC | PRN

## 2021-03-22 MED ORDER — TRAMADOL HCL 50 MG PO TABS: 50.0000 mg | ORAL_TABLET | Freq: Four times a day (QID) | ORAL | 2 refills | Status: DC | PRN

## 2021-03-22 MED ORDER — LAMOTRIGINE 100 MG PO TABS: 100.0000 mg | ORAL_TABLET | Freq: Every day | ORAL | 1 refills | Status: DC

## 2021-03-22 MED ORDER — ZOLPIDEM TARTRATE 10 MG PO TABS: 10.0000 mg | ORAL_TABLET | Freq: Every evening | ORAL | 1 refills | Status: DC | PRN

## 2021-03-22 NOTE — Progress Notes (Signed)
Subjective:  Patient ID: Jonathon Griffin, male    DOB: [birth year]  Age: 65 y.o. MRN: [id number]  CC: Follow-up   HPI TAURENCE GOOS presents for rectal fistula - Dr Johney Maine: surgery is Dec 1st F/u OA, insomnia, dyslipidemia  Outpatient Medications Prior to Visit  Medication Sig Dispense Refill   aspirin EC 81 MG tablet Take 1 tablet (81 mg total) by mouth daily. 90 tablet 3   atorvastatin (LIPITOR) 80 MG tablet TAKE 1 TABLET BY MOUTH EVERY DAY AT 6PM 90 tablet 3   Cholecalciferol (VITAMIN D3) 50 MCG (2000 UT) capsule Take 1 capsule (2,000 Units total) by mouth daily. 100 capsule 3   famotidine (PEPCID) 20 MG tablet TAKE 1 TABLET BY MOUTH TWICE A DAY 180 tablet 2   metoprolol succinate (TOPROL-XL) 25 MG 24 hr tablet TAKE 1 TABLET BY MOUTH EVERY DAY 90 tablet 2   tadalafil (CIALIS) 5 MG tablet Take 1 tablet (5 mg total) by mouth daily. 90 tablet 3   triamcinolone ointment (KENALOG) 0.1 % Apply 1 application topically 2 (two) times daily. 80 g 1   lamoTRIgine (LAMICTAL) 100 MG tablet TAKE 1 TABLET BY MOUTH EVERY DAY 90 tablet 1   traMADol (ULTRAM) 50 MG tablet Take 1 tablet (50 mg total) by mouth every 6 (six) hours as needed for severe pain. 90 tablet 2   traZODone (DESYREL) 50 MG tablet TAKE 1 TO 2 TABLETS BY MOUTH AT BEDTIME AS NEEDED FOR SLEEP 180 tablet 1   zolpidem (AMBIEN) 10 MG tablet TAKE 0.5-1 TABLETS (5-10 MG TOTAL) BY MOUTH AT BEDTIME AS NEEDED FOR SLEEP. 90 tablet 0   No facility-administered medications prior to visit.    ROS: Review of Systems  Constitutional:  Negative for appetite change, fatigue and unexpected weight change.  HENT:  Negative for congestion, nosebleeds, sneezing, sore throat and trouble swallowing.   Eyes:  Negative for itching and visual disturbance.  Respiratory:  Negative for cough.   Cardiovascular:  Negative for chest pain, palpitations and leg swelling.  Gastrointestinal:  Positive for anal bleeding and rectal pain. Negative for abdominal  distention, blood in stool, diarrhea and nausea.  Genitourinary:  Negative for frequency and hematuria.  Musculoskeletal:  Positive for arthralgias. Negative for back pain, gait problem, joint swelling and neck pain.  Skin:  Negative for rash.  Neurological:  Negative for dizziness, tremors, speech difficulty and weakness.  Psychiatric/Behavioral:  Positive for sleep disturbance. Negative for agitation and dysphoric mood. The patient is not nervous/anxious.    Objective:  BP 122/70 (BP Location: Left Arm)   Pulse 62   Temp 98.8 F (37.1 C) (Oral)   Ht 5\' 10"  (1.778 m)   Wt 209 lb 6.4 oz (95 kg)   SpO2 97%   BMI 30.05 kg/m   BP Readings from Last 3 Encounters:  03/22/21 122/70  08/19/20 120/80  07/30/20 130/72    Wt Readings from Last 3 Encounters:  03/22/21 209 lb 6.4 oz (95 kg)  08/19/20 202 lb (91.6 kg)  07/30/20 202 lb (91.6 kg)    Physical Exam Constitutional:      General: He is not in acute distress.    Appearance: He is well-developed.     Comments: NAD  Eyes:     Conjunctiva/sclera: Conjunctivae normal.     Pupils: Pupils are equal, round, and reactive to light.  Neck:     Thyroid: No thyromegaly.     Vascular: No JVD.  Cardiovascular:     Rate  and Rhythm: Normal rate and regular rhythm.     Heart sounds: Normal heart sounds. No murmur heard.   No friction rub. No gallop.  Pulmonary:     Effort: Pulmonary effort is normal. No respiratory distress.     Breath sounds: Normal breath sounds. No wheezing or rales.  Chest:     Chest wall: No tenderness.  Abdominal:     General: Bowel sounds are normal. There is no distension.     Palpations: Abdomen is soft. There is no mass.     Tenderness: There is no abdominal tenderness. There is no guarding or rebound.  Musculoskeletal:        General: Tenderness present. Normal range of motion.     Cervical back: Normal range of motion.  Lymphadenopathy:     Cervical: No cervical adenopathy.  Skin:    General: Skin  is warm and dry.     Findings: No rash.  Neurological:     Mental Status: He is alert and oriented to person, place, and time.     Cranial Nerves: No cranial nerve deficit.     Motor: No abnormal muscle tone.     Coordination: Coordination normal.     Gait: Gait normal.     Deep Tendon Reflexes: Reflexes are normal and symmetric.  Psychiatric:        Behavior: Behavior normal.        Thought Content: Thought content normal.        Judgment: Judgment normal.   Stiff joints  Lab Results  Component Value Date   WBC 9.6 03/24/2020   HGB 13.2 03/24/2020   HCT 38.8 (L) 03/24/2020   PLT 195.0 03/24/2020   GLUCOSE 89 03/24/2020   CHOL 110 03/24/2020   TRIG 223.0 (H) 03/24/2020   HDL 38.60 (L) 03/24/2020   LDLDIRECT 51.0 03/24/2020   LDLCALC 56 06/09/2019   ALT 26 03/24/2020   AST 20 03/24/2020   NA 139 03/24/2020   K 3.9 03/24/2020   CL 104 03/24/2020   CREATININE 1.16 03/24/2020   BUN 20 03/24/2020   CO2 29 03/24/2020   TSH 2.16 03/24/2020   PSA 0.75 03/24/2020   INR 2.3 (H) 05/14/2019    MR PELVIS W WO CONTRAST  Result Date: 02/21/2021 CLINICAL DATA:  Left anorectal pain, bleeding and pus for 5 months. EXAM: MRI PELVIS WITHOUT AND WITH CONTRAST TECHNIQUE: Multiplanar multisequence MR imaging of the pelvis was performed both before and after administration of intravenous contrast. CONTRAST:  78mL MULTIHANCE GADOBENATE DIMEGLUMINE 529 MG/ML IV SOLN COMPARISON:  01/28/2021 CT pelvis. FINDINGS: Urinary Tract:  Normal urinary bladder. Bowel: There is an intersphincteric perianal fistula arising from 1-2 o'clock anterior mid left anal wall (series 7/image 17) with inferior extension to the medial left gluteal fold, with thick enhancing wall and surrounding inflammatory fat stranding throughout the course of the fistula tract. There is a 3.3 x 1.2 x 0.9 cm gas containing abscess in the anterolateral left perianal region along the fistula tract (series 2/image 24 and series 7/image 21).  Vascular/Lymphatic: A few mildly prominent rounded perirectal lymph nodes, largest 0.6 cm on the left (series 9/image 9). No acute vascular abnormality. Reproductive: Mild prostatomegaly. Symmetric normal seminal vesicles. Other:  No pelvic ascites. Musculoskeletal: No aggressive appearing focal osseous lesions. IMPRESSION: 1. Intersphincteric left perianal fistula arising from the 1-2 o'clock anterior mid left anal wall with inferior extension to the medial left gluteal fold. 2. Gas-containing 3.3 x 1.2 x 0.9 cm left perianal abscess  along the fistula tract. 3. Mildly prominent perirectal lymph nodes, largest 0.6 cm on the left, nonspecific. 4. Mild prostatomegaly. Electronically Signed   By: Ilona Sorrel M.D.   On: 02/21/2021 10:30    Assessment & Plan:   Problem List Items Addressed This Visit     Arthralgia    Chronic Will continue with tramadol prn  Potential benefits of a long term opioids use as well as potential risks (i.e. addiction risk, apnea etc) and complications (i.e. Somnolence, constipation and others) were explained to the patient and were aknowledged.      Essential hypertension    Monitor blood pressure at home.  No added salt diet      Hematochezia     Due to rectal fistula - Dr Johney Maine: surgery is Dec 1st      Insomnia    Chronic Continue with zolpidem prn  Potential benefits of a long term opioids use as well as potential risks (i.e. addiction risk, apnea etc) and complications (i.e. Somnolence, constipation and others) were explained to the patient and were aknowledged.      Rectal fistula    Chronic rectal fistula - Dr Johney Maine: surgery is Dec 1st      Well adult exam - Primary   Relevant Orders   TSH   Urinalysis   CBC with Differential/Platelet   Lipid panel   PSA   Comprehensive metabolic panel      Meds ordered this encounter  Medications   traMADol (ULTRAM) 50 MG tablet    Sig: Take 1 tablet (50 mg total) by mouth every 6 (six) hours as needed for  severe pain.    Dispense:  90 tablet    Refill:  2    This request is for a new prescription for a controlled substance as required by Federal/State law..   zolpidem (AMBIEN) 10 MG tablet    Sig: Take 1 tablet (10 mg total) by mouth at bedtime as needed for sleep.    Dispense:  90 tablet    Refill:  1   traZODone (DESYREL) 50 MG tablet    Sig: Take 1-2 tablets (50-100 mg total) by mouth at bedtime as needed. for sleep    Dispense:  180 tablet    Refill:  1   lamoTRIgine (LAMICTAL) 100 MG tablet    Sig: Take 1 tablet (100 mg total) by mouth daily.    Dispense:  90 tablet    Refill:  1   chlorhexidine (HIBICLENS) 4 % external liquid    Sig: Apply topically daily as needed.    Dispense:  950 mL    Refill:  0      Follow-up: Return in about 4 months (around 07/20/2021) for Wellness Exam.  Walker Kehr, MD

## 2021-03-26 DIAGNOSIS — K604 Rectal fistula: Secondary | ICD-10-CM | POA: Insufficient documentation

## 2021-03-26 NOTE — Assessment & Plan Note (Addendum)
Chronic Continue with zolpidem prn  Potential benefits of a long term opioids use as well as potential risks (i.e. addiction risk, apnea etc) and complications (i.e. Somnolence, constipation and others) were explained to the patient and were aknowledged.

## 2021-03-26 NOTE — Assessment & Plan Note (Signed)
Monitor blood pressure at home.  No added salt diet

## 2021-03-26 NOTE — Assessment & Plan Note (Signed)
Chronic Will continue with tramadol prn  Potential benefits of a long term opioids use as well as potential risks (i.e. addiction risk, apnea etc) and complications (i.e. Somnolence, constipation and others) were explained to the patient and were aknowledged.

## 2021-03-26 NOTE — Assessment & Plan Note (Signed)
Chronic rectal fistula - Dr Michaell Cowing: surgery is Dec 1st

## 2021-03-26 NOTE — Assessment & Plan Note (Signed)
Due to rectal fistula - Dr Michaell Cowing: surgery is Dec 1st

## 2021-04-01 NOTE — Progress Notes (Addendum)
COVID swab appointment: n/a  COVID Vaccine Completed: yes x3 Date COVID Vaccine completed: 08/02/19, 08/20/19 Has received booster: 11/05/20 COVID vaccine manufacturer: Pfizer      Date of COVID positive in last 90 days: no  PCP - Jacinta Shoe, MD Cardiologist - Mackey Birchwood, MD  Chest x-ray - n/a EKG - 04/04/21 Epic Stress Test - n/a ECHO - 05/28/19 Epic Cardiac Cath - 2020 Pacemaker/ICD device last checked: n/a Spinal Cord Stimulator: n/a  Sleep Study - positive CPAP - had surgery, doesn't use CPAP  Fasting Blood Sugar - n/a Checks Blood Sugar _____ times a day  Blood Thinner Instructions: Aspirin Instructions: ASA 81, told to keep taking Last Dose:  Activity level: Can go up a flight of stairs and perform activities of daily living without stopping and without symptoms of chest pain or shortness of breath.    Anesthesia review: HTN, STEMI, CAD, respiratory failure, OSA, stroke  Patient denies shortness of breath, fever, cough and chest pain at PAT appointment   Patient verbalized understanding of instructions that were given to them at the PAT appointment. Patient was also instructed that they will need to review over the PAT instructions again at home before surgery.

## 2021-04-01 NOTE — Patient Instructions (Addendum)
DUE TO COVID-19 ONLY ONE VISITOR IS ALLOWED TO COME WITH YOU AND STAY IN THE WAITING ROOM ONLY DURING PRE OP AND PROCEDURE.   **NO VISITORS ARE ALLOWED IN THE SHORT STAY AREA OR RECOVERY ROOM!!**       Your procedure is scheduled on: 04/06/21   Report to [facility] Main Entrance    Report to admitting at 1:45 PM   Call this number if you have problems the morning of surgery [phone]   Follow instructions given to you by surgeons office for diet day before surgery.   May have liquids until 1:00 PM day of surgery  CLEAR LIQUID DIET  Foods Allowed                                                                     Foods Excluded  Water, Black Coffee and tea (no milk or creamer)           liquids that you cannot  Plain Jell-O in any flavor  (No red)                                    see through such as: Fruit ices (not with fruit pulp)                                            milk, soups, orange juice              Iced Popsicles (No red)                                               All solid food                                   Apple juices  Sports drinks like Gatorade (No red) Lightly seasoned clear broth or consume(fat free) Sugar   Drink 2 Ensure drinks the night before surgery, have finished by 10pm.  Complete one Ensure drink the day of surgery 3 hours prior to scheduled surgery at 1PM.     The day of surgery:  Drink ONE (1) Pre-Surgery Clear Ensure by 1 pm the day of surgery. Drink in one sitting. Do not sip.  This drink was given to you during your hospital  pre-op appointment visit. Nothing else to drink after completing the  Pre-Surgery Clear Ensure.          If you have questions, please contact your surgeon's office.     Oral Hygiene is also important to reduce your risk of infection.                                    Remember - BRUSH YOUR TEETH THE MORNING OF SURGERY WITH YOUR REGULAR TOOTHPASTE   Take these medicines the morning  of surgery  with A SIP OF WATER: Augmentin, Pepcid, Lamictal, Metoprolol succinate, Tramadol.                               You may not have any metal on your body including jewelry, and body piercing             Do not wear lotions, powders, cologne, or deodorant              Men may shave face and neck.   Do not bring valuables to the hospital.  IS NOT             RESPONSIBLE   FOR VALUABLES.    Patients discharged on the day of surgery will not be allowed to drive home.  Special Instructions: Bring a copy of your healthcare power of attorney and living will documents         the day of surgery if you haven't scanned them before.   Please read over the following fact sheets you were given: IF YOU HAVE QUESTIONS ABOUT YOUR PRE-OP INSTRUCTIONS PLEASE CALL [phone]- [facility] - Preparing for Surgery Before surgery, you can play an important role.  Because skin is not sterile, your skin needs to be as free of germs as possible.  You can reduce the number of germs on your skin by washing with CHG (chlorahexidine gluconate) soap before surgery.  CHG is an antiseptic cleaner which kills germs and bonds with the skin to continue killing germs even after washing. Please DO NOT use if you have an allergy to CHG or antibacterial soaps.  If your skin becomes reddened/irritated stop using the CHG and inform your nurse when you arrive at Short Stay. Do not shave (including legs and underarms) for at least 48 hours prior to the first CHG shower.  You may shave your face/neck.  Please follow these instructions carefully:  1.  Shower with CHG Soap the night before surgery and the  morning of surgery.  2.  If you choose to wash your hair, wash your hair first as usual with your normal  shampoo.  3.  After you shampoo, rinse your hair and body thoroughly to remove the shampoo.                             4.  Use CHG as you would any other liquid soap.  You can apply chg directly to the skin  and wash.  Gently with a scrungie or clean washcloth.  5.  Apply the CHG Soap to your body ONLY FROM THE NECK DOWN.   Do   not use on face/ open                           Wound or open sores. Avoid contact with eyes, ears mouth and   genitals (private parts).                       Wash face,  Genitals (private parts) with your normal soap.             6.  Wash thoroughly, paying special attention to the area where your    surgery  will be performed.  7.  Thoroughly rinse your body with warm water from the neck down.  8.  DO NOT shower/wash with your normal soap after using and rinsing off the CHG Soap.                9.  Pat yourself dry with a clean towel.            10.  Wear clean pajamas.            11.  Place clean sheets on your bed the night of your first shower and do not  sleep with pets. Day of Surgery : Do not apply any lotions/deodorants the morning of surgery.  Please wear clean clothes to the hospital/surgery center.  FAILURE TO FOLLOW THESE INSTRUCTIONS MAY RESULT IN THE CANCELLATION OF YOUR SURGERY  PATIENT SIGNATURE_________________________________  NURSE SIGNATURE__________________________________  ________________________________________________________________________

## 2021-04-04 ENCOUNTER — Encounter (HOSPITAL_COMMUNITY): Payer: Self-pay

## 2021-04-04 ENCOUNTER — Other Ambulatory Visit: Payer: Self-pay

## 2021-04-04 ENCOUNTER — Encounter (HOSPITAL_COMMUNITY)
Admission: RE | Admit: 2021-04-04 | Discharge: 2021-04-04 | Disposition: A | Discharge status: Home / Self Care | Payer: Managed Care, Other (non HMO) | Source: Ambulatory Visit | Attending: Surgery | Admitting: Surgery

## 2021-04-04 VITALS — BP 143/85 | HR 60 | Temp 98.2°F | Resp 16 | Ht 70.0 in | Wt 204.5 lb

## 2021-04-04 DIAGNOSIS — Z01818 Encounter for other preprocedural examination: Secondary | ICD-10-CM | POA: Diagnosis not present

## 2021-04-04 DIAGNOSIS — I10 Essential (primary) hypertension: Secondary | ICD-10-CM | POA: Diagnosis not present

## 2021-04-04 DIAGNOSIS — Z8673 Personal history of transient ischemic attack (TIA), and cerebral infarction without residual deficits: Secondary | ICD-10-CM | POA: Insufficient documentation

## 2021-04-04 DIAGNOSIS — K604 Rectal fistula: Secondary | ICD-10-CM | POA: Insufficient documentation

## 2021-04-04 DIAGNOSIS — Z955 Presence of coronary angioplasty implant and graft: Secondary | ICD-10-CM | POA: Diagnosis not present

## 2021-04-04 DIAGNOSIS — Z8674 Personal history of sudden cardiac arrest: Secondary | ICD-10-CM | POA: Diagnosis not present

## 2021-04-04 DIAGNOSIS — I2510 Atherosclerotic heart disease of native coronary artery without angina pectoris: Secondary | ICD-10-CM | POA: Insufficient documentation

## 2021-04-04 DIAGNOSIS — G4733 Obstructive sleep apnea (adult) (pediatric): Secondary | ICD-10-CM | POA: Insufficient documentation

## 2021-04-04 DIAGNOSIS — Z7982 Long term (current) use of aspirin: Secondary | ICD-10-CM | POA: Diagnosis not present

## 2021-04-04 HISTORY — DX: Gastro-esophageal reflux disease without esophagitis: K21.9

## 2021-04-04 LAB — BASIC METABOLIC PANEL
Anion gap: 5 (ref 5–15)
BUN: 15 mg/dL (ref 8–23)
CO2: 27 mmol/L (ref 22–32)
Calcium: 9.5 mg/dL (ref 8.9–10.3)
Chloride: 106 mmol/L (ref 98–111)
Creatinine, Ser: 1.06 mg/dL (ref 0.61–1.24)
GFR, Estimated: >60 mL/min (ref >60)
Glucose, Bld: 105 mg/dL — ABNORMAL HIGH (ref 70–99)
Potassium: 4.4 mmol/L (ref 3.5–5.1)
Sodium: 138 mmol/L (ref 135–145)

## 2021-04-04 LAB — CBC
HCT: 43.2 % (ref 39.0–52.0)
Hemoglobin: 13.9 g/dL (ref 13.0–17.0)
MCH: 30 pg (ref 26.0–34.0)
MCHC: 32.2 g/dL (ref 30.0–36.0)
MCV: 93.1 fL (ref 80.0–100.0)
Platelets: 190 10*3/uL (ref 150–400)
RBC: 4.64 MIL/uL (ref 4.22–5.81)
RDW: 13.9 % (ref 11.5–15.5)
WBC: 7.1 10*3/uL (ref 4.0–10.5)
nRBC: 0 % (ref 0.0–0.2)

## 2021-04-05 NOTE — Progress Notes (Signed)
Anesthesia Chart Review   Case: [id number] Date/Time: 04/06/21 1045   Procedure: REPAIR OF PERIRECTAL FISTULA. POSSIBLE HEMORRHOIDECTOMY. ANORECTAL EXAMINATION UNDER ANESTHESIA - GEN w/ERAS PATHWAY LOCAL   Anesthesia type: General   Pre-op diagnosis: PERIRECTAL FISTULA   Location: [facility] ROOM 08 / [facility]   Surgeons: Karie Soda, MD       DISCUSSION:65 y.o. never smoker with h/o HTN, sleep apnea, CAD (VF arrest 03/2019 in [place], s/p PCI to RCA), CVA, perirectal fistula scheduled for above procedure 04/06/2021 with Dr. Karie Soda.   Pt last seen by PCP 03/22/21.   Per cardiology preoperative evaluation 09/07/2020, "Chart reviewed as part of pre-operative protocol coverage. Patient was contacted 09/07/2020 in reference to pre-operative risk assessment for pending surgery as outlined below.  LAKSHYA MCGILLICUDDY was last seen on 07/30/2020 by Dr. Flora Lipps.  Since that day, [name] has done well without chest pain or worsening dyspnea.   Therefore, based on ACC/AHA guidelines, the patient would be at acceptable risk for the planned procedure without further cardiovascular testing.  With his prior history of cardiac arrest in 2020 and multiple strokes, he would be higher risk off of aspirin.  I spoke with Dr. Hulen Shouts nurse, who states they can do the procedure on the aspirin if needed."  Anticipate pt can proceed with planned procedure barring acute status change.   VS: BP (!) 143/85   Pulse 60   Temp 36.8 C (Oral)   Resp 16   Ht 5\' 10"  (1.778 m)   Wt 92.8 kg   SpO2 95%   BMI 29.34 kg/m   PROVIDERS: Plotnikov, , MD is PCP   Georgina Quint, MD is Cardiologist  LABS: Labs reviewed: Acceptable for surgery. (all labs ordered are listed, but only abnormal results are displayed)  Labs Reviewed  BASIC METABOLIC PANEL - Abnormal; Notable for the following components:      Result Value   Glucose, Bld 105 (*)    All other components within normal limits  CBC      IMAGES:   EKG: 04/04/2021 Rate 58 bpm  Sinus bradycardia with premature atrial complexes RBBB T wave abnormality, consider inferior ischemia   CV: Echo 05/28/2019  1. Left ventricular ejection fraction, by visual estimation, is 50 to  55%. The left ventricle has low normal function. There is no left  ventricular hypertrophy.   2. Basal inferoseptal segment, basal inferolateral segment, basal  inferior segment, and mid inferolateral segment are abnormal.   3. Left ventricular diastolic parameters are indeterminate.   4. The left ventricle demonstrates regional wall motion abnormalities.   5. Global right ventricle has low normal systolic function.The right  ventricular size is normal. No increase in right ventricular wall  thickness.   6. Left atrial size was normal.   7. Right atrial size was normal.   8. The mitral valve is grossly normal. Trivial mitral valve  regurgitation.   9. The tricuspid valve is grossly normal.  10. The aortic valve is tricuspid. Aortic valve regurgitation is trivial.  11. The pulmonic valve was grossly normal. Pulmonic valve regurgitation is  trivial.  12. TR signal is inadequate for assessing pulmonary artery systolic  pressure.  13. The inferior vena cava is normal in size with greater than 50%  respiratory variability, suggesting right atrial pressure of 3 mmHg. Past Medical History:  Diagnosis Date   Acute on chronic respiratory failure with hypoxia (HCC)    Acute ST elevation myocardial infarction (STEMI) (HCC)  Anxiety    Arthritis    Aspiration pneumonia due to gastric secretions (HCC)    Cardiac arrest (HCC)    Coronary artery disease    Depression    Embolic stroke involving anterior cerebral artery (HCC)    GERD (gastroesophageal reflux disease)    Hyperlipidemia    Hypertension    Obstructive sleep apnea    Sleep apnea     Past Surgical History:  Procedure Laterality Date   bicep surgery     CARDIAC CATHETERIZATION      PEG PLACEMENT     TRACHEOSTOMY      MEDICATIONS:  amoxicillin-clavulanate (AUGMENTIN) 875-125 MG tablet   aspirin EC 81 MG tablet   atorvastatin (LIPITOR) 80 MG tablet   chlorhexidine (HIBICLENS) 4 % external liquid   Cholecalciferol (VITAMIN D3) 50 MCG (2000 UT) capsule   famotidine (PEPCID) 20 MG tablet   lamoTRIgine (LAMICTAL) 100 MG tablet   metoprolol succinate (TOPROL-XL) 25 MG 24 hr tablet   tadalafil (CIALIS) 5 MG tablet   traMADol (ULTRAM) 50 MG tablet   traZODone (DESYREL) 50 MG tablet   triamcinolone ointment (KENALOG) 0.1 %   zolpidem (AMBIEN) 10 MG tablet   No current facility-administered medications for this encounter.     Jodell Cipro Ward, PA-C [facility] Pre-Surgical Testing [phone]

## 2021-04-05 NOTE — Anesthesia Preprocedure Evaluation (Addendum)
Anesthesia Evaluation  Patient identified by MRN, date of birth, ID band Patient awake    Reviewed: Allergy & Precautions, NPO status , Patient's Chart, lab work & pertinent test results  History of Anesthesia Complications Negative for: history of anesthetic complications  Airway Mallampati: II  TM Distance: >3 FB Neck ROM: Full    Dental  (+) Dental Advisory Given, Caps   Pulmonary sleep apnea (s/p UPPP, no CPAP use) ,  H/o trach for VDRF, has healed   breath sounds clear to auscultation       Cardiovascular hypertension, (-) angina+ CAD and + Past MI   Rhythm:Regular Rate:Normal  '20 cardiac arrest/CPR with defib, straight to cath/RCA stent, CPR resulted in sternal trauma with rib separation, pt with VDRF  '21 ECHO: EF 50-55%. The LV has low normal function, with Basal inferoseptal segment, basal inferolateral segment, basal inferior segment, and mid inferolateral segment abnormal. Global right ventricle has low normal systolic function. No significant valvular abnormalities   Neuro/Psych Anxiety Depression CVA, No Residual Symptoms    GI/Hepatic GERD  Medicated and Controlled,(+)     substance abuse  alcohol use,   Endo/Other  negative endocrine ROS  Renal/GU negative Renal ROS     Musculoskeletal  (+) Arthritis ,   Abdominal   Peds  Hematology negative hematology ROS (+)   Anesthesia Other Findings   Reproductive/Obstetrics                           Anesthesia Physical Anesthesia Plan  ASA: 3  Anesthesia Plan: General   Post-op Pain Management:    Induction: Intravenous  PONV Risk Score and Plan: 2 and Ondansetron and Dexamethasone  Airway Management Planned: Oral ETT  Additional Equipment: None  Intra-op Plan:   Post-operative Plan: Extubation in OR  Informed Consent: I have reviewed the patients History and Physical, chart, labs and discussed the procedure including  the risks, benefits and alternatives for the proposed anesthesia with the patient or authorized representative who has indicated his/her understanding and acceptance.     Dental advisory given  Plan Discussed with: CRNA and Surgeon  Anesthesia Plan Comments: (See PAT note 04/04/2021, Jodell Cipro Ward, PA-C)      Anesthesia Quick Evaluation

## 2021-04-06 ENCOUNTER — Encounter (HOSPITAL_COMMUNITY)
Admission: RE | Disposition: A | Discharge status: Home / Self Care | Payer: Self-pay | Source: Home / Self Care | Attending: Surgery

## 2021-04-06 ENCOUNTER — Ambulatory Visit (HOSPITAL_COMMUNITY)
Admission: RE | Admit: 2021-04-06 | Discharge: 2021-04-06 | Disposition: A | Discharge status: Home / Self Care | Payer: Managed Care, Other (non HMO) | Attending: Surgery | Admitting: Surgery

## 2021-04-06 ENCOUNTER — Encounter (HOSPITAL_COMMUNITY): Payer: Self-pay | Admitting: Surgery

## 2021-04-06 ENCOUNTER — Ambulatory Visit (HOSPITAL_COMMUNITY): Payer: Managed Care, Other (non HMO) | Admitting: Certified Registered Nurse Anesthetist

## 2021-04-06 ENCOUNTER — Ambulatory Visit (HOSPITAL_COMMUNITY): Payer: Managed Care, Other (non HMO) | Admitting: Physician Assistant

## 2021-04-06 DIAGNOSIS — Z955 Presence of coronary angioplasty implant and graft: Secondary | ICD-10-CM | POA: Diagnosis not present

## 2021-04-06 DIAGNOSIS — I2510 Atherosclerotic heart disease of native coronary artery without angina pectoris: Secondary | ICD-10-CM | POA: Insufficient documentation

## 2021-04-06 DIAGNOSIS — I252 Old myocardial infarction: Secondary | ICD-10-CM | POA: Diagnosis not present

## 2021-04-06 DIAGNOSIS — I10 Essential (primary) hypertension: Secondary | ICD-10-CM | POA: Diagnosis not present

## 2021-04-06 DIAGNOSIS — F418 Other specified anxiety disorders: Secondary | ICD-10-CM | POA: Insufficient documentation

## 2021-04-06 DIAGNOSIS — G4730 Sleep apnea, unspecified: Secondary | ICD-10-CM | POA: Diagnosis not present

## 2021-04-06 DIAGNOSIS — Z7902 Long term (current) use of antithrombotics/antiplatelets: Secondary | ICD-10-CM | POA: Insufficient documentation

## 2021-04-06 DIAGNOSIS — K611 Rectal abscess: Secondary | ICD-10-CM | POA: Insufficient documentation

## 2021-04-06 DIAGNOSIS — Z8674 Personal history of sudden cardiac arrest: Secondary | ICD-10-CM | POA: Insufficient documentation

## 2021-04-06 DIAGNOSIS — Z8673 Personal history of transient ischemic attack (TIA), and cerebral infarction without residual deficits: Secondary | ICD-10-CM | POA: Diagnosis not present

## 2021-04-06 DIAGNOSIS — K648 Other hemorrhoids: Secondary | ICD-10-CM | POA: Diagnosis not present

## 2021-04-06 DIAGNOSIS — K603 Anal fistula: Secondary | ICD-10-CM

## 2021-04-06 DIAGNOSIS — M1990 Unspecified osteoarthritis, unspecified site: Secondary | ICD-10-CM | POA: Diagnosis not present

## 2021-04-06 DIAGNOSIS — K219 Gastro-esophageal reflux disease without esophagitis: Secondary | ICD-10-CM | POA: Diagnosis not present

## 2021-04-06 DIAGNOSIS — K604 Rectal fistula: Secondary | ICD-10-CM | POA: Diagnosis not present

## 2021-04-06 HISTORY — PX: EVALUATION UNDER ANESTHESIA WITH HEMORRHOIDECTOMY: SHX5624

## 2021-04-06 SURGERY — EXAM UNDER ANESTHESIA WITH HEMORRHOIDECTOMY
Anesthesia: General

## 2021-04-06 MED ORDER — ONDANSETRON HCL 4 MG/2ML IJ SOLN
INTRAMUSCULAR | Status: AC
  Filled 2021-04-06: qty 2

## 2021-04-06 MED ORDER — CHLORHEXIDINE GLUCONATE CLOTH 2 % EX PADS: 6.0000 | MEDICATED_PAD | Freq: Once | CUTANEOUS | Status: DC

## 2021-04-06 MED ORDER — LACTATED RINGERS IV SOLN: INTRAVENOUS | Status: DC

## 2021-04-06 MED ORDER — MIDAZOLAM HCL 2 MG/2ML IJ SOLN: 0.5000 mg | Freq: Once | INTRAMUSCULAR | Status: DC | PRN

## 2021-04-06 MED ORDER — FENTANYL CITRATE (PF) 100 MCG/2ML IJ SOLN
INTRAMUSCULAR | Status: DC | PRN
  Administered 2021-04-06: 50 ug via INTRAVENOUS
  Administered 2021-04-06: 100 ug via INTRAVENOUS
  Administered 2021-04-06 (×2): 50 ug via INTRAVENOUS

## 2021-04-06 MED ORDER — METHYLENE BLUE 0.5 % INJ SOLN
INTRAVENOUS | Status: AC
  Filled 2021-04-06: qty 10

## 2021-04-06 MED ORDER — PROPOFOL 500 MG/50ML IV EMUL
INTRAVENOUS | Status: AC
  Filled 2021-04-06: qty 50

## 2021-04-06 MED ORDER — BUPIVACAINE LIPOSOME 1.3 % IJ SUSP: 20.0000 mL | Freq: Once | INTRAMUSCULAR | Status: DC

## 2021-04-06 MED ORDER — DIBUCAINE (PERIANAL) 1 % EX OINT
TOPICAL_OINTMENT | CUTANEOUS | Status: AC
  Filled 2021-04-06: qty 28

## 2021-04-06 MED ORDER — OXYCODONE HCL 5 MG PO TABS
5.0000 mg | ORAL_TABLET | Freq: Four times a day (QID) | ORAL | 0 refills | Status: DC | PRN
Start: 2021-04-06 — End: 2021-04-06

## 2021-04-06 MED ORDER — HYDROMORPHONE HCL 1 MG/ML IJ SOLN
0.5000 mg | INTRAMUSCULAR | Status: DC | PRN
  Administered 2021-04-06 (×2): 0.5 mg via INTRAVENOUS

## 2021-04-06 MED ORDER — FENTANYL CITRATE (PF) 250 MCG/5ML IJ SOLN
INTRAMUSCULAR | Status: AC
  Filled 2021-04-06: qty 5

## 2021-04-06 MED ORDER — DIBUCAINE (PERIANAL) 1 % EX OINT
TOPICAL_OINTMENT | CUTANEOUS | Status: DC | PRN
  Administered 2021-04-06: 1 via RECTAL

## 2021-04-06 MED ORDER — PROPOFOL 10 MG/ML IV BOLUS
INTRAVENOUS | Status: DC | PRN
  Administered 2021-04-06: 150 mg via INTRAVENOUS
  Administered 2021-04-06: 60 mg via INTRAVENOUS

## 2021-04-06 MED ORDER — LIDOCAINE 2% (20 MG/ML) 5 ML SYRINGE
INTRAMUSCULAR | Status: DC | PRN
  Administered 2021-04-06: 50 mg via INTRAVENOUS

## 2021-04-06 MED ORDER — PROPOFOL 10 MG/ML IV BOLUS
INTRAVENOUS | Status: AC
  Filled 2021-04-06: qty 20

## 2021-04-06 MED ORDER — ACETAMINOPHEN 500 MG PO TABS
ORAL_TABLET | ORAL | Status: AC
  Filled 2021-04-06: qty 2

## 2021-04-06 MED ORDER — HYDROCODONE-ACETAMINOPHEN 5-325 MG PO TABS: 1.0000 | ORAL_TABLET | Freq: Four times a day (QID) | ORAL | 0 refills | Status: DC | PRN

## 2021-04-06 MED ORDER — SODIUM CHLORIDE 0.9 % IV SOLN
2.0000 g | Freq: Once | INTRAVENOUS | Status: AC
  Administered 2021-04-06: 2 g via INTRAVENOUS
  Filled 2021-04-06: qty 20

## 2021-04-06 MED ORDER — HYDROMORPHONE HCL 1 MG/ML IJ SOLN
INTRAMUSCULAR | Status: AC
  Filled 2021-04-06: qty 1

## 2021-04-06 MED ORDER — HYDROCODONE-ACETAMINOPHEN 5-325 MG PO TABS
ORAL_TABLET | ORAL | Status: AC
  Filled 2021-04-06: qty 1

## 2021-04-06 MED ORDER — CHLORHEXIDINE GLUCONATE 0.12 % MT SOLN
15.0000 mL | Freq: Once | OROMUCOSAL | Status: AC
  Administered 2021-04-06: 15 mL via OROMUCOSAL

## 2021-04-06 MED ORDER — ROCURONIUM BROMIDE 10 MG/ML (PF) SYRINGE
PREFILLED_SYRINGE | INTRAVENOUS | Status: DC | PRN
  Administered 2021-04-06: 60 mg via INTRAVENOUS
  Administered 2021-04-06: 20 mg via INTRAVENOUS

## 2021-04-06 MED ORDER — ACETAMINOPHEN 500 MG PO TABS
1000.0000 mg | ORAL_TABLET | ORAL | Status: AC
  Administered 2021-04-06: 1000 mg via ORAL
  Filled 2021-04-06: qty 2

## 2021-04-06 MED ORDER — MEPERIDINE HCL 50 MG/ML IJ SOLN: 6.2500 mg | INTRAMUSCULAR | Status: DC | PRN

## 2021-04-06 MED ORDER — ONDANSETRON HCL 4 MG/2ML IJ SOLN
INTRAMUSCULAR | Status: DC | PRN
  Administered 2021-04-06: 4 mg via INTRAVENOUS

## 2021-04-06 MED ORDER — METHYLENE BLUE 1 % INJ SOLN
INTRAVENOUS | Status: DC | PRN
  Administered 2021-04-06: 1 mL

## 2021-04-06 MED ORDER — BUPIVACAINE-EPINEPHRINE 0.5% -1:200000 IJ SOLN
INTRAMUSCULAR | Status: DC | PRN
  Administered 2021-04-06: 20 mL

## 2021-04-06 MED ORDER — BUPIVACAINE-EPINEPHRINE (PF) 0.5% -1:200000 IJ SOLN
INTRAMUSCULAR | Status: AC
  Filled 2021-04-06: qty 30

## 2021-04-06 MED ORDER — ROCURONIUM BROMIDE 10 MG/ML (PF) SYRINGE
PREFILLED_SYRINGE | INTRAVENOUS | Status: AC
  Filled 2021-04-06: qty 10

## 2021-04-06 MED ORDER — ORAL CARE MOUTH RINSE: 15.0000 mL | Freq: Once | OROMUCOSAL | Status: AC

## 2021-04-06 MED ORDER — BUPIVACAINE LIPOSOME 1.3 % IJ SUSP
INTRAMUSCULAR | Status: DC | PRN
  Administered 2021-04-06: 20 mL

## 2021-04-06 MED ORDER — BUPIVACAINE LIPOSOME 1.3 % IJ SUSP
INTRAMUSCULAR | Status: AC
  Filled 2021-04-06: qty 20

## 2021-04-06 MED ORDER — ENSURE PRE-SURGERY PO LIQD
296.0000 mL | Freq: Once | ORAL | Status: DC
  Filled 2021-04-06: qty 296

## 2021-04-06 MED ORDER — HYDROCODONE-ACETAMINOPHEN 5-325 MG PO TABS
1.0000 | ORAL_TABLET | Freq: Once | ORAL | Status: AC
  Administered 2021-04-06: 1 via ORAL

## 2021-04-06 MED ORDER — PROMETHAZINE HCL 25 MG/ML IJ SOLN
6.2500 mg | INTRAMUSCULAR | Status: DC | PRN
Start: 2021-04-06 — End: 2021-04-07

## 2021-04-06 MED ORDER — SUGAMMADEX SODIUM 200 MG/2ML IV SOLN
INTRAVENOUS | Status: DC | PRN
  Administered 2021-04-06: 200 mg via INTRAVENOUS

## 2021-04-06 MED ORDER — HYDROMORPHONE HCL 1 MG/ML IJ SOLN
0.2500 mg | INTRAMUSCULAR | Status: DC | PRN
  Administered 2021-04-06 (×4): 0.5 mg via INTRAVENOUS

## 2021-04-06 MED ORDER — GABAPENTIN 300 MG PO CAPS
300.0000 mg | ORAL_CAPSULE | ORAL | Status: AC
  Administered 2021-04-06: 300 mg via ORAL
  Filled 2021-04-06: qty 1

## 2021-04-06 MED ORDER — DEXAMETHASONE SODIUM PHOSPHATE 10 MG/ML IJ SOLN
INTRAMUSCULAR | Status: AC
  Filled 2021-04-06: qty 1

## 2021-04-06 MED ORDER — DEXAMETHASONE SODIUM PHOSPHATE 4 MG/ML IJ SOLN
INTRAMUSCULAR | Status: DC | PRN
  Administered 2021-04-06: 5 mg via INTRAVENOUS

## 2021-04-06 MED ORDER — DIAZEPAM 5 MG PO TABS: 5.0000 mg | ORAL_TABLET | Freq: Three times a day (TID) | ORAL | 1 refills | Status: DC | PRN

## 2021-04-06 SURGICAL SUPPLY — 38 items
APL SKNCLS STERI-STRIP NONHPOA (GAUZE/BANDAGES/DRESSINGS) ×1
BAG COUNTER SPONGE SURGICOUNT (BAG) IMPLANT
BAG SPNG CNTER NS LX DISP (BAG)
BAG SURGICOUNT SPONGE COUNTING (BAG)
BENZOIN TINCTURE PRP APPL 2/3 (GAUZE/BANDAGES/DRESSINGS) ×3 IMPLANT
BLADE SURG 15 STRL LF DISP TIS (BLADE) IMPLANT
BLADE SURG 15 STRL SS (BLADE)
CNTNR URN SCR LID CUP LEK RST (MISCELLANEOUS) ×1 IMPLANT
CONT SPEC 4OZ STRL OR WHT (MISCELLANEOUS) ×3
COVER SURGICAL LIGHT HANDLE (MISCELLANEOUS) ×3 IMPLANT
DECANTER SPIKE VIAL GLASS SM (MISCELLANEOUS) ×3 IMPLANT
DRAPE LAPAROTOMY T 102X78X121 (DRAPES) ×3 IMPLANT
DRSG PAD ABDOMINAL 8X10 ST (GAUZE/BANDAGES/DRESSINGS) ×3 IMPLANT
ELECT REM PT RETURN 15FT ADLT (MISCELLANEOUS) ×3 IMPLANT
GAUZE 4X4 16PLY ~~LOC~~+RFID DBL (SPONGE) ×3 IMPLANT
GAUZE SPONGE 4X4 12PLY STRL (GAUZE/BANDAGES/DRESSINGS) ×3 IMPLANT
GLOVE SURG NEOPR MICRO LF SZ8 (GLOVE) ×3 IMPLANT
GLOVE SURG UNDER LTX SZ8 (GLOVE) ×3 IMPLANT
GOWN STRL REUS W/TWL XL LVL3 (GOWN DISPOSABLE) ×6 IMPLANT
KIT BASIN OR (CUSTOM PROCEDURE TRAY) ×3 IMPLANT
KIT TURNOVER KIT A (KITS) IMPLANT
LOOP VESSEL MAXI BLUE (MISCELLANEOUS) IMPLANT
NEEDLE HYPO 22GX1.5 SAFETY (NEEDLE) ×3 IMPLANT
PACK BASIC VI WITH GOWN DISP (CUSTOM PROCEDURE TRAY) ×3 IMPLANT
PANTS MESH DISP LRG (UNDERPADS AND DIAPERS) ×1 IMPLANT
PANTS MESH DISPOSABLE L (UNDERPADS AND DIAPERS) ×2
PENCIL SMOKE EVACUATOR (MISCELLANEOUS) IMPLANT
SHEARS HARMONIC 9CM CVD (BLADE) IMPLANT
SURGILUBE 2OZ TUBE FLIPTOP (MISCELLANEOUS) ×3 IMPLANT
SUT CHROMIC 2 0 SH (SUTURE) ×3 IMPLANT
SUT CHROMIC 3 0 SH 27 (SUTURE) IMPLANT
SUT VIC AB 2-0 SH 27 (SUTURE)
SUT VIC AB 2-0 SH 27X BRD (SUTURE) IMPLANT
SUT VIC AB 2-0 UR6 27 (SUTURE) ×18 IMPLANT
SYR 20ML LL LF (SYRINGE) ×3 IMPLANT
SYR 3ML LL SCALE MARK (SYRINGE) IMPLANT
TOWEL OR 17X26 10 PK STRL BLUE (TOWEL DISPOSABLE) ×3 IMPLANT
TOWEL OR NON WOVEN STRL DISP B (DISPOSABLE) ×3 IMPLANT

## 2021-04-06 NOTE — Interval H&P Note (Signed)
History and Physical Interval Note:  04/06/2021 9:47 AM  Jonathon Griffin  has presented today for surgery, with the diagnosis of PERIRECTAL FISTULA.  The various methods of treatment have been discussed with the patient and family. After consideration of risks, benefits and other options for treatment, the patient has consented to  Procedure(s) with comments: REPAIR OF PERIRECTAL FISTULA. POSSIBLE HEMORRHOIDECTOMY. ANORECTAL EXAMINATION UNDER ANESTHESIA (N/A) - GEN w/ERAS PATHWAY LOCAL as a surgical intervention.  The patient's history has been reviewed, patient examined, no change in status, stable for surgery.  I have reviewed the patient's chart and labs.  Questions were answered to the patient's satisfaction.    I have re-reviewed the the patient's records, history, medications, and allergies.  I have re-examined the patient.  I again discussed intraoperative plans and goals of post-operative recovery.  The patient agrees to proceed.  Jonathon Griffin  [birth year] [id number]  Patient Care Team: Tresa Garter, MD as PCP - General (Internal Medicine) O'Neal, Ronnald Ramp, MD as PCP - Cardiology (Cardiology) Frederico Hamman, MD as Consulting Physician (Orthopedic Surgery) Rosemarie Beath, NP as Nurse Practitioner (Psychiatry) Drema Dallas, DO as Consulting Physician (Neurology) Willis Modena, MD as Consulting Physician (Gastroenterology) Karie Soda, MD as Consulting Physician (General Surgery)  Patient Active Problem List   Diagnosis Date Noted   Rectal fistula 03/26/2021   Hematochezia 08/19/2020   BPH (benign prostatic hyperplasia) 04/06/2020   Arthralgia 04/06/2020   Coronary artery disease 01/04/2020   AC joint pain 10/02/2019   Shoulder pain, left 10/02/2019   Erectile dysfunction 10/02/2019   Acute on chronic respiratory failure with hypoxia (HCC)    Obstructive sleep apnea    Aspiration pneumonia due to gastric secretions (HCC)    Acute ST elevation myocardial  infarction (STEMI) [facility])    Cardiac arrest (HCC)    Embolic stroke involving anterior cerebral artery (HCC)    MDD (major depressive disorder) 02/24/2018   Anxiety disorder 02/08/2018   Abdominal pain 01/16/2018   Weight loss, non-intentional 01/16/2018   Well adult exam 08/10/2017   Warts 08/10/2017   Acute bronchitis 07/07/2014   Cough 07/07/2014   Osteoarthritis 07/07/2014   Polycythemia, secondary 03/06/2014   Essential hypertension 03/06/2014   Insomnia 08/16/2012   Elevated blood pressure 06/14/2012   Patient exposure to body fluids 04/11/2012   Situational depression 04/11/2012   Hyperlipidemia 03/19/2009   Alcohol abuse, in remission 03/19/2009   Abnormal CBC measurement 03/19/2009   Elevated AST (SGOT) 03/19/2009   DYSPNEA 02/15/2009   PULMONARY FUNCTION TESTS, ABNORMAL 02/15/2009    Past Medical History:  Diagnosis Date   Acute on chronic respiratory failure with hypoxia (HCC)    Acute ST elevation myocardial infarction (STEMI) (HCC)    Anxiety    Arthritis    Aspiration pneumonia due to gastric secretions (HCC)    Cardiac arrest (HCC)    Coronary artery disease    Depression    Embolic stroke involving anterior cerebral artery (HCC)    GERD (gastroesophageal reflux disease)    Hyperlipidemia    Hypertension    Obstructive sleep apnea    Sleep apnea     Past Surgical History:  Procedure Laterality Date   bicep surgery     CARDIAC CATHETERIZATION     PEG PLACEMENT     TRACHEOSTOMY      Social History   Socioeconomic History   Marital status: Married    Spouse name: Not on file   Number of children: Not on file  Years of education: Not on file   Highest education level: Not on file  Occupational History   Occupation: welder  Tobacco Use   Smoking status: Never   Smokeless tobacco: Never  Vaping Use   Vaping Use: Never used  Substance and Sexual Activity   Alcohol use: No   Drug use: No   Sexual activity: Yes  Other Topics Concern   Not  on file  Social History Narrative   Not on file   Social Determinants of Health   Financial Resource Strain: Not on file  Food Insecurity: Not on file  Transportation Needs: Not on file  Physical Activity: Not on file  Stress: Not on file  Social Connections: Not on file  Intimate Partner Violence: Not on file    Family History  Problem Relation Age of Onset   Arthritis Mother    Arthritis Sister     Medications Prior to Admission  Medication Sig Dispense Refill Last Dose   acetaminophen (TYLENOL) 500 MG tablet Take 500 mg by mouth every 6 (six) hours as needed for moderate pain.   04/05/2021 at 0900   amoxicillin-clavulanate (AUGMENTIN) 875-125 MG tablet Take 1 tablet by mouth 2 (two) times daily.   04/03/2021   aspirin EC 81 MG tablet Take 1 tablet (81 mg total) by mouth daily. 90 tablet 3 04/06/2021 at 0530   atorvastatin (LIPITOR) 80 MG tablet TAKE 1 TABLET BY MOUTH EVERY DAY AT 6PM 90 tablet 3 04/05/2021   chlorhexidine (HIBICLENS) 4 % external liquid Apply topically daily as needed. 950 mL 0 04/06/2021   Cholecalciferol (VITAMIN D3) 50 MCG (2000 UT) capsule Take 1 capsule (2,000 Units total) by mouth daily. 100 capsule 3 04/05/2021   famotidine (PEPCID) 20 MG tablet TAKE 1 TABLET BY MOUTH TWICE A DAY 180 tablet 2 04/06/2021 at 0530   lamoTRIgine (LAMICTAL) 100 MG tablet Take 1 tablet (100 mg total) by mouth daily. 90 tablet 1 04/05/2021 at 0630   metoprolol succinate (TOPROL-XL) 25 MG 24 hr tablet TAKE 1 TABLET BY MOUTH EVERY DAY 90 tablet 2 04/06/2021 at 0530   tadalafil (CIALIS) 5 MG tablet Take 1 tablet (5 mg total) by mouth daily. (Patient taking differently: Take 5 mg by mouth daily as needed for erectile dysfunction.) 90 tablet 3 Past Month   traMADol (ULTRAM) 50 MG tablet Take 1 tablet (50 mg total) by mouth every 6 (six) hours as needed for severe pain. 90 tablet 2 04/05/2021 at 1500   traZODone (DESYREL) 50 MG tablet Take 1-2 tablets (50-100 mg total) by mouth at  bedtime as needed. for sleep 180 tablet 1 04/04/2021   triamcinolone ointment (KENALOG) 0.1 % Apply 1 application topically 2 (two) times daily. (Patient taking differently: Apply 1 application topically 2 (two) times daily as needed (irritation).) 80 g 1 04/04/2021   zolpidem (AMBIEN) 10 MG tablet Take 1 tablet (10 mg total) by mouth at bedtime as needed for sleep. 90 tablet 1 04/05/2021 at 2200    Current Facility-Administered Medications  Medication Dose Route Frequency Provider Last Rate Last Admin   acetaminophen (TYLENOL) 500 MG tablet            bupivacaine liposome (EXPAREL) 1.3 % injection 266 mg  20 mL Infiltration Once Karie Soda, MD       cefTRIAXone (ROCEPHIN) 2 g in sodium chloride 0.9 % 100 mL IVPB  2 g Intravenous Once Karie Soda, MD       Chlorhexidine Gluconate Cloth 2 % PADS 6  each  6 each Topical Once Karie Soda, MD       And   Chlorhexidine Gluconate Cloth 2 % PADS 6 each  6 each Topical Once Karie Soda, MD       Melene Muller ON 04/07/2021] feeding supplement (ENSURE PRE-SURGERY) liquid 296 mL  296 mL Oral Once Karie Soda, MD       lactated ringers infusion   Intravenous Continuous Eilene Ghazi, MD 10 mL/hr at 04/06/21 0934 New Bag at 04/06/21 0934     Allergies  Allergen Reactions   Nsaids Other (See Comments)    Told to avoid with h/o MI   Oxycodone Itching   Wellbutrin [Bupropion]     tremors    BP (!) 154/94   Pulse 73   Temp 98.6 F (37 C) (Oral)   Resp 16   SpO2 97%   Labs: No results found for this or any previous visit (from the past 48 hour(s)).  Imaging / Studies: No results found.   Ardeth Sportsman, M.D., F.A.C.S. Gastrointestinal and Minimally Invasive Surgery [facility] [address], [address] [place], [place] [address] [phone] Main / Paging  04/06/2021 9:47 AM    Ardeth Sportsman

## 2021-04-06 NOTE — Op Note (Signed)
04/06/2021  1:07 PM  PATIENT:  Jonathon Griffin  65 y.o. male  Patient Care Team: Plotnikov, Georgina Quint, MD as PCP - General (Internal Medicine) O'Neal, Ronnald Ramp, MD as PCP - Cardiology (Cardiology) Frederico Hamman, MD as Consulting Physician (Orthopedic Surgery) Rosemarie Beath, NP as Nurse Practitioner (Psychiatry) Drema Dallas, DO as Consulting Physician (Neurology) Willis Modena, MD as Consulting Physician (Gastroenterology) Karie Soda, MD as Consulting Physician (General Surgery)  PRE-OPERATIVE DIAGNOSIS:  PERIRECTAL FISTULA  POST-OPERATIVE DIAGNOSIS:  INTERSPHINCTERIC PERIRECTAL FISTULA.  PROCEDURE: LIFT (ligation of intersphincteric fistula tract) REPAIR OF FISTULA.  HEMORRHOIDAL LIGATION/PEXY ANORECTAL EXAMINATION UNDER ANESTHESIA  SURGEON:  Ardeth Sportsman, MD  ASSISTANT: OR Staff   ANESTHESIA:   General Anorectal & Local field block (0.25% bupivacaine with epinephrine mixed with Liposomal bupivacaine (Experel)    EBL:  Total I/O In: 100 [IV Piggyback:100] Out: 50 [Blood:50].  See anesthesia record  Delay start of Pharmacological VTE agent (>24hrs) due to surgical blood loss or risk of bleeding:  no  DRAINS: none   SPECIMEN: External perirectal fistulous tract  DISPOSITION OF SPECIMEN:  PATHOLOGY  COUNTS:  YES  PLAN OF CARE: Discharge to home after PACU  PATIENT DISPOSITION:  PACU - hemodynamically stable.  INDICATION: Patient with probable perirectal fistula.  I recommended examination and surgical treatment:  The anatomy & physiology of the anorectal region was discussed.  We discussed the pathophysiology of anorectal abscess and fistula.  Differential diagnosis was discussed.  Natural history progression was discussed.   I stressed the importance of a bowel regimen to have daily soft bowel movements to minimize progression of disease.     The patient's condition is not adequately controlled.  Non-operative treatment has not healed the  fistula.  Therefore, I recommended examination under anaesthesia to confirm the diagnosis and treat the fistula.  I discussed techniques that may be required such as fistulotomy, ligation by LIFT technique, and/or seton placement.  Benefits & alternatives discussed.  I noted a good likelihood this will help address the problem, but sometimes repeat operations and prolonged healing times may occur.  Risks such as bleeding, pain, recurrence, reoperation, incontinence, heart attack, death, and other risks were discussed.      Educational handouts further explaining the pathology, treatment options, and bowel regimen were given.  The patient expressed understanding & wishes to proceed.  We will work to coordinate surgery for a mutually convenient time.   OR FINDINGS: Patient had an intersphincteric fistula.    External location LEFT LATERAL  about 2.5 cm from anal verge.  Internal location : Anterior left midline at anal crypt about 1.5 cm from anal verge.  DESCRIPTION:   Informed consent was confirmed. Patient underwent general anesthesia without difficulty. Patient was placed into prone/jacknife positioning.  The perianal region was prepped and draped in sterile fashion. Surgical timeout confirmed or plan.  I did digital rectal examination and then transitioned over to anoscopy to get a sense of the anatomy.  I did place a probe through the external opening.  I also injected the track with methylene blue.  With this I was able to locate an internal opening.  The tract did not feel superficial, concerning for a probable intersphincteric fistula.  No abscess located.  I went ahead and proceeded with the LIFT technique.  Used a large Research scientist (life sciences) and transition to a Physicist, medical   I began to excise the external opening with a radial biconcave incision around it.  I transitioned to cautery and help  free the fistulous tract circumferentially all way down towards the sphincter component.    I made  an incision at the anal squamocolumnar junction .  Did careful dissection to get down to the sphincter complex.  I carefully went between the internal and external sphincter using careful blunt dissection parallel to the fibers.  I was able to locate the intersphincteric component of the fistulous tract.  I was able to get around it gently with a right angle clamp.  I carefully skeletonized the intersphincteric component.  I placed 2-0 Vicryl stitches through the intersphincteric tract on the proximal side and on the distal side in between the external & internal sphincters.  I transected the intersphincteric segment of the fistulous tract.   I ligated the stumps of the transected segments with 2-0 Vicryl again with a figure-of-eight stitch in a 90 degree fashion to doubly ligate and prove that the tract had been closed.   I removed the superficial external end of the fistulous tract & ligated the base of the external wound just outside the sphincter component with 2-0 vicryl.  I then transitioned to the rectal component.  Did a figure-of-eight stitch of 2-0 Vicryl suture several centimeters proximal to the internal opening in the rectum along a hemorrhoidal column.  I ran that longitudinally until it came to the opening.  I transected the rectal tissue anorectal component and a longitudinal fusiform fashion until I had healthier tissue.  I then ran the 2-0 Vicryl stitch down to the anal verge to also help cover up the intersphincteric ligation wound as well.  I tied that running suture down, thus closing the internal opening and protecting the LIFT repair.  I did a another horizontal mattress suture at the anal verge of Vicryl to help cover over the sphincter.  I did an internal hemorrhoidal ligation on the remaining hemorrhoidal columns (left posterior, right posterior/lateral/anterior) using 2-0 Vicryl suture in a longitudinal running interrupted fashion to provide hemorrhoidal ligation and pexy on the  remaining hemorrhoidal columns.  Again did that over a Programmer, systems to avoid any narrowing.  I excised the external fistulous track and prior left posterior lateral scarring to leave an open flat ellipsoid wound 25 x 25 mm.  I did a 2-0 Vicryl pursestring suture between the distal dermal anal coverage and proximal ischial rectal fat to help protect the ligation of the fistulous tract at the external component and tied that down.  That allowed the external wound to be more superficial.   I reexamined the anal canal.   There is was no narrowing.  Hemostasis was excellent.  I repeated anoscopy and examination.  Hemostasis was good.  We placed fluff gauze to onlay over the wounds.  No packing done.  Patient is being extubated go to recovery room.  I discussed operative findings, updated the patient's status, discussed probable steps to recovery, and gave postoperative recommendations to the patient's spouse, Clara.  Recommendations were made.  Questions were answered.  She expressed understanding & appreciation.   Ardeth Sportsman, M.D., F.A.C.S. Gastrointestinal and Minimally Invasive Surgery [facility] [address], [address] [place], [place] [address] [phone] Main / Paging

## 2021-04-06 NOTE — Anesthesia Postprocedure Evaluation (Signed)
Anesthesia Post Note  Patient: Jonathon Griffin  Procedure(s) Performed: LIFT REPAIR OF Intersphenteric perirectal FISTULA. HEMORRHOID ligation. ANORECTAL EXAMINATION UNDER ANESTHESIA     Patient location during evaluation: PACU Anesthesia Type: General Level of consciousness: awake and alert, patient cooperative and oriented Pain management: pain level controlled (pain improving) Vital Signs Assessment: post-procedure vital signs reviewed and stable Respiratory status: spontaneous breathing, nonlabored ventilation and respiratory function stable Cardiovascular status: blood pressure returned to baseline and stable Postop Assessment: no apparent nausea or vomiting Anesthetic complications: no   No notable events documented.  Last Vitals:  Vitals:   04/06/21 1345 04/06/21 1400  BP: 131/79 (!) 139/92  Pulse: (!) 55 (!) 54  Resp: 15 11  Temp:    SpO2: 100% 100%    Last Pain:  Vitals:   04/06/21 1410  TempSrc:   PainSc: 6                  Drena Ham,E. Kanna Dafoe

## 2021-04-06 NOTE — H&P (Signed)
04/06/2021    REFERRING PHYSICIAN: Dawayne Cirri, PA  Patient Care Team: Plotnikov, Evie Lacks, MD as PCP - General (Internal Medicine) Johney Maine, Adrian Saran, MD as Consulting Provider (General Surgery) Hulan Fray (Cardiovascular Disease) Arta Silence, MD (Internal Medicine)  PROVIDER: Hollace Kinnier, MD  [facility] MRN: [id number] DOB: [birth year] DATE OF ENCOUNTER:  Subjective   Chief Complaint: No chief complaint on file. Perirectal abscess/fistula  History of Present Illness: Jonathon Griffin is a 65 y.o. male who is seen today as an office consultation at the request of Deliah Goody, [facility] gastroenterology. Perirectal abscess probable fistula.   65 year old male. Survivor of a life-threatening myocardial infarction requiring stenting on on Brilinta. Now on aspirin only. Patient normally has a bowel movement every day. He already have an episode of drainage and bleeding. Was concerning. Underwent colonoscopy by Dr. Paulita Fujita on April. I do not have those records but per the patient it was underwhelming. Patient's had intermittent perianal drainage persisting. Placed on antibiotics at least 3 times. Fistula suspected. He did have an MRI done earlier this month which confirmed a probable intersphincteric fistula and a small abscess. Patient notes he still has a lot of pain. He completed his last round of antibiotics. Uncomfortable to sit. Having chronic drainage. Irritating.  Patient does not smoke. No diabetes. He had sleep apnea in distant past but had palate surgery by Dr. Janace Hoard over a decade ago and does not have his those issues according to him. He can walk at least 1/2-hour without difficulty. Followed by Dr. Davina Poke with cardiology since his massive myocardial infarction 2 years ago. No exertional chest pain or shortness of breath  No new events.  Ready for surgery  Medical History: Past Medical History:  Diagnosis Date   Anxiety   Arthritis   Hypertension    Sleep apnea   There is no problem list on file for this patient.  Past Surgical History:  Procedure Laterality Date   fuse 2 discs in neck  2011   Reattach right biscep  2008   Sleep apnea   STENT PLACEMENT INTRACRANIAL PERCUTANEOUS  2020    Allergies  Allergen Reactions   Oxycodone-Acetaminophen Hives  REACTION: itching   Current Outpatient Medications on File Prior to Visit  Medication Sig Dispense Refill   aspirin 81 MG EC tablet Take by mouth   cholecalciferol (VITAMIN D3) 2,000 unit capsule Take by mouth   traZODone (DESYREL) 50 MG tablet TAKE 1 TO 2 TABLETS BY MOUTH AT BEDTIME AS NEEDED FOR SLEEP   atorvastatin (LIPITOR) 80 MG tablet TAKE 1 TABLET BY MOUTH EVERY DAY AT 6PM   famotidine (PEPCID) 20 MG tablet   lamoTRIgine (LAMICTAL) 100 MG tablet Take 100 mg by mouth once daily   metoprolol succinate (TOPROL-XL) 25 MG XL tablet Take 25 mg by mouth once daily   zolpidem (AMBIEN) 10 mg tablet TAKE 0.5-1 TABLETS (5-10 MG TOTAL) BY MOUTH AT BEDTIME AS NEEDED FOR SLEEP.   No current facility-administered medications on file prior to visit.   Family History  Problem Relation Age of Onset   Diabetes Mother    Social History   Tobacco Use  Smoking Status Never Smoker  Smokeless Tobacco Never Used    Social History   Socioeconomic History   Marital status: Married  Tobacco Use   Smoking status: Never Smoker   Smokeless tobacco: Never Used  Substance and Sexual Activity   Alcohol use: Never   Drug use: Never   ############################################################  Review  of Systems: A complete review of systems (ROS) was obtained from the patient. I have reviewed this information and discussed as appropriate with the patient. See HPI as well for other pertinent ROS.  Constitutional: No fevers, chills, sweats. Weight stable Eyes: No vision changes, No discharge HENT: No sore throats, nasal drainage Lymph: No neck swelling, No bruising  easily Pulmonary: No cough, productive sputum CV: No orthopnea, PND Patient walks 30 minutes for about 2 miles without difficulty. No exertional chest/neck/shoulder/arm pain.  GI: No personal nor family history of GI/colon cancer, inflammatory bowel disease, irritable bowel syndrome, allergy such as Celiac Sprue, dietary/dairy problems, colitis, ulcers nor gastritis. No recent sick contacts/gastroenteritis. No travel outside the country. No changes in diet.  Renal: No UTIs, No hematuria Genital: No drainage, bleeding, masses Musculoskeletal: No severe joint pain. Good ROM major joints Skin: No sores or lesions Heme/Lymph: No easy bleeding. No swollen lymph nodes  Objective:   Vitals:  03/07/21 1533  BP: 132/70  Pulse: 75  Temp: 37.1 C (98.7 F)  SpO2: 98%  Weight: 92.4 kg (203 lb 9.6 oz)  Height: 177.8 cm (5\' 10" )    Body mass index is 29.21 kg/m.  PHYSICAL EXAM:  Constitutional: Not cachectic. Hygeine adequate. Vitals signs as above.  Eyes: Pupils reactive, normal extraocular movements. Sclera nonicteric Neuro: CN II-XII intact. No major focal sensory defects. No major motor deficits. Lymph: No head/neck/groin lymphadenopathy Psych: No severe agitation. No severe anxiety. Judgment & insight Adequate, Oriented x4, HENT: Normocephalic, Mucus membranes moist. No thrush.  Neck: Supple, No tracheal deviation. No obvious thyromegaly Chest: No pain to chest wall compression. Good respiratory excursion. No audible wheezing CV: Pulses intact. Regular rhythm. No major extremity edema  Abdomen: Flat Hernia: Small umbilical hernia through flat stalk. Diastasis recti: Mild supraumbilical midline. Soft. Nondistended. Nontender. No hepatomegaly. No splenomegaly  Gen: Inguinal hernia: Not present. Inguinal lymph nodes: without lymphadenopathy.   Rectal:  ##################################  Patient examined in decubitus position.  Perianal skin Mild fecal soiling  Pruritis ani:  Moderate Anal fissure: Not present Perirectal abscess/fistula Present at: Left posterior lateral aspect 5x 3 cm area of erythema with 3x2 cm area of skin necrosis and pustules nation consistent with chronic abscess  External hemorrhoids Not present Pilonidal disease: Not present Condyloma / warts: Not present  Digital and anoscopic rectal exam Not tolerated  Sphincter tone Increased  Hemorrhoidal piles  Rectal masses:   Exam done with assistance of male Medical Assistant in the room.   Patient with obvious perirectal abscess with thickening and phlegmon. Uncontrolled perirectal abscess with probable fistula by MRI. I think he requires incision and drainage for better decompression and control. He agreed. The anatomy & physiology of the anorectal region was discussed. The pathophysiology of anorectal abscess and differential diagnosis was discussed. Natural history progression such as fistula, gangrene, etc was discussed. I stressed the importance of a bowel regimen to have daily soft bowel movements to minimize progression of disease.   The patient's symptoms are not adequately controlled. Non-operative treatment has not healed the abscess. Therefore, I recommended incision & drainage of the abscess to allow the infection to resolve and heal. Technique, risks, benefits, alternatives discussed. The patient expressed understanding & wished to proceed.  The patient was positioned lateral decubitus. I placed a field block with local anaesthetic. I incised the skin over the left posterior perirectal abscess to release the infection. I excised skin at the wound to have an adequate opening for drainage & prevent skin reclosure. I  probed into the gluteal fat as well as to the base of the pelvis and some areas of thickening. Did not find any undrained abscess. I was able to do digital exam once he was numb and confirmed some thickening in the left anterior rectal wall but no abscess to that. Thrombosed  hemorrhoid left lateral pile and I was able to excise and decompress through the perirectal wound. The patient tolerated the procedure.   Educational handouts further explaining the pathology, treatment options, and bowel regimen were given. We will have the patient return to clinic for close follow up to make sure the infection heals  ###################################  Ext: No obvious deformity or contracture. Edema: Not present. No cyanosis Skin: No major subcutaneous nodules. Warm and dry Musculoskeletal: Severe joint rigidity not present. No obvious clubbing. No digital petechiae.   Labs, Imaging and Diagnostic Testing:  Located in 'Care Everywhere' section of Epic EMR chart  PRIOR NOTES   Not applicable  SURGERY NOTES:  Not applicable  PATHOLOGY:  Not applicable  Assessment and Plan:  DIAGNOSES:  Diagnoses and all orders for this visit:  Perirectal abscess  Intersphincteric fistula  History of ST elevation myocardial infarction (STEMI)  Other orders - HYDROcodone-acetaminophen (NORCO) 5-325 mg tablet; Take 1 tablet by mouth every 6 (six) hours as needed for Pain for up to 5 days - amoxicillin-clavulanate (AUGMENTIN) 875-125 mg tablet; Take 1 tablet (875 mg total) by mouth every 12 (twelve) hours for 5 days    ASSESSMENT/PLAN  Persistent perirectal abscess despite numerous rounds of antibiotics and some spontaneous drainage. This required incision and drainage in the office today. It under local anesthetic with excision of chronic fibrotic abscess cavity.  I think he requires operative examination under anesthesia and probable anal fistula repair since it looks intersphincteric by MRI. I would like to wait about 6 weeks for this infection to be minimal in the area to heal. I asked him to call us in a couple weeks to let us know how things are going. If his pain has gone down he is feeling much better, follow-up with surgery in 6 weeks. If questions or concerns  follow more aggressively.   The anatomy & physiology of the anorectal region was discussed. We discussed the pathophysiology of anorectal abscess and fistula. Differential diagnosis was discussed. Natural history progression was discussed. I stressed the importance of a bowel regimen to have daily soft bowel movements to minimize progression of disease.   The patient's condition is not adequately controlled. Non-operative treatment has not healed the fistula. Therefore, I recommended examination under anaesthesia to confirm the diagnosis and treat the fistula. I discussed techniques that may be required such as fistulotomy, ligation by LIFT technique, and/or seton placement. Benefits & alternatives discussed. I noted a good likelihood this will help address the problem, but sometimes repeat operations and prolonged healing times may occur. Risks such as bleeding, pain, recurrence, reoperation, injury to other organs, need for repair of tissues / organs reoperation, incontinence, heart attack, death, and other risks were discussed.   Educational handouts further explaining the pathology, treatment options, and bowel regimen were given. The patient expressed understanding & wishes to proceed. We will work to coordinate surgery for a mutually convenient time.     With his history of massive myocardial infarction requiring stenting, I think he requires cardiac clearance. He has good exercise tolerance now and this is not a high risk surgery.  Was cleared earlier this year for his colonoscopy by cardiology and they  are aware    ########################################################  Adin Hector, MD, FACS, MASCRS Esophageal, Gastrointestinal & Colorectal Surgery Robotic and Minimally Invasive Surgery  [facility], [place]  [place]. [address] [place], [place] [address] [phone] Fax [phone]  Main       Note: Portions of this report may have been transcribed using voice recognition software. Every effort was made to ensure accuracy; however, inadvertent computerized transcription errors may be present. Any transcriptional errors that result from this process are unintentional.

## 2021-04-06 NOTE — Discharge Instructions (Signed)
##############################################  ANORECTAL SURGERY:  POST OPERATIVE INSTRUCTIONS  ######################################################################  EAT Start with a pureed / full liquid diet After 24 hours, gradually transition to a high fiber diet.    CONTROL PAIN Control pain so you can tolerate bowel movements,  walk, sleep, tolerate sneezing/coughing, and go up/down stairs.   HAVE A BOWEL MOVEMENT DAILY Keep your bowels regular to avoid problems.   Taking a fiber supplement every day to keep bowels soft.   Try a laxative to override constipation. Use an antidairrheal to slow down diarrhea.   Call if not better after 2 tries  WALK Walk an hour a day.  Control your pain to do that.   CALL IF YOU HAVE PROBLEMS/CONCERNS Call if you are still struggling despite following these instructions. Call if you have concerns not answered by these instructions  ######################################################################    Take your usually prescribed home medications unless otherwise directed.  DIET: Follow a light bland diet & liquids the first 24 hours after arrival home, such as soup, liquids, starches, etc.  Be sure to drink plenty of fluids.  Quickly advance to a usual solid diet within a few days.  Avoid fast food or heavy meals as your are more likely to get nauseated or have irregular bowels.  A low-fat, high-fiber diet for the rest of your life is ideal.  PAIN CONTROL: Pain is best controlled by a usual combination of many methods TOGETHER: Warm baths/soaks or Ice packs Over the counter pain medication Prescription pain medications Topical creams  Expect swelling and discomfort in the anus/rectal area.  Warm water baths (30-60 minutes up to 6 times a day, especially after bowel meovements) will help. Use ice for the first few days to help decrease swelling and bruising, then switch to heat such as warm towels, sitz baths, warm baths, etc to  help relax tight/sore spots and speed recovery.  Some people prefer to use ice alone, heat alone, alternating between ice & heat.  Experiment to what works for you.   It is helpful to take an over-the-counter pain medication continuously for the first few weeks.  Choose Acetaminophen (Tylenol, etc) 500-1000mg  four times a day (every meal & bedtime) A  prescription for pain medication (such as oxycodone, hydrocodone, etc) should be given to you upon discharge.  Take your pain medication as prescribed.  If you are having problems/concerns with the prescription medicine (does not control pain, nausea, vomiting, rash, itching, etc), please call us [phone] to see if we need to switch you to a different pain medicine that will work better for you and/or control your side effect better. If you need a refill on your pain medication, please contact your pharmacy.  They will contact our office to request authorization. Prescriptions will not be filled after 5 pm or on week-ends.  If can take up to 48 hours for it to be filled & ready so avoid waiting until you are down to thel ast pill. A topical cream (Dibucaine) or a prescription for a cream (such as diltiazem 2% gel) may be given to you.  Many people find relief with topical creams.  Some people find it burns too much.  Experiment.  If it helps, use it.  If it burns, don't using it. You also may receive a prescription for diazepam, and a muscle relaxant to help you to be able to urinate and defecate more easily.  It is safe to take a few doses with the other medications as long as  you are not planning to drive or do anything intense.  Hopefully this can minimize the chance of needing a Foley catheter into your bladder   Use a Sitz Bath 4-8 times a day for relief   Sitz Bath A sitz bath is a warm water bath taken in the sitting position that covers only the hips and buttocks. It may be used for either healing or hygiene purposes. Sitz baths are also used  to relieve pain, itching, or muscle spasms. The water may contain medicine. Moist heat will help you heal and relax.  HOME CARE INSTRUCTIONS  Take 3 to 4 sitz baths a day. Fill the bathtub half full with warm water. Sit in the water and open the drain a little. Turn on the warm water to keep the tub half full. Keep the water running constantly. Soak in the water for 15 to 20 minutes. After the sitz bath, pat the affected area dry first.   KEEP YOUR BOWELS REGULAR The goal is one soft bowel movement a day Avoid getting constipated.  Between the surgery and the pain medications, it is common to experience some constipation.  Increasing fluid intake and taking a fiber supplement (such as Metamucil, Citrucel, FiberCon, MiraLax, etc) 2-3 times a day regularly will usually help prevent this problem from occurring.  A mild laxative (prune juice, Milk of Magnesia, MiraLax, etc) should be taken according to package directions if there are no bowel movements after 48 hours. Watch out for diarrhea.  If you have many loose bowel movements, simplify your diet to bland foods & liquids for a few days.  Stop any stool softeners and decrease your fiber supplement.  Switching to mild anti-diarrheal medications (Kayopectate, Pepto Bismol) can help.  Can try an imodium/loperamide dose.  If this worsens or does not improve, please call us.  Wound Care  Remove your bandages with your first bowel movement, usually the day after surgery.  Let the gauze fall off with the first bowel movement or shower.  There is no packing Wear an absorbent pad or soft cotton balls in your underwear as needed to catch any drainage and help keep the area  Keep the area clean and dry.  Bathe / shower every day.  Keep the area clean by showering / bathing over the incision / wound.   It is okay to soak an open wound to help wash it.  Consider using a squeeze bottle filled with warm water to gently wash the anal area.  Wet wipes or showers /  gentle washing after bowel movements is often less traumatic than regular toilet paper. You will often notice bleeding with bowel movements.  This should slow down by the end of the first week of surgery.  Sitting on an ice pack can help. Expect some drainage.  This should slow down by the end of the first week of surgery, but you will have occasional bleeding or drainage up to a few months after surgery.  Wear an absorbent pad or soft cotton gauze in your underwear until the drainage stops.  ACTIVITIES as tolerated:   You may resume regular (light) daily activities beginning the next day--such as daily self-care, walking, climbing stairs--gradually increasing activities as tolerated.  If you can walk 30 minutes without difficulty, it is safe to try more intense activity such as jogging, treadmill, bicycling, low-impact aerobics, swimming, etc. Save the most intensive and strenuous activity for last such as sit-ups, heavy lifting, contact sports, etc  Refrain from any  heavy lifting or straining until you are off narcotics for pain control.   DO NOT PUSH THROUGH PAIN.  Let pain be your guide: If it hurts to do something, don't do it.  Pain is your body warning you to avoid that activity for another week until the pain goes down. You may drive when you are no longer taking prescription pain medication, you can comfortably sit for long periods of time, and you can safely maneuver your car and apply brakes. You may have sexual intercourse when it is comfortable.  FOLLOW UP in our office Please call [facility] at [phone] to set up an appointment to see your surgeon in the office for a follow-up appointment approximately 2-3 weeks after your surgery. Make sure that you call for this appointment the day you arrive home to ensure a convenient appointment time.  8. IF YOU HAVE DISABILITY OR FAMILY LEAVE FORMS, BRING THEM TO THE OFFICE FOR PROCESSING.  DO NOT GIVE THEM TO YOUR DOCTOR.        WHEN TO CALL  us [phone]: Poor pain control Reactions / problems with new medications (rash/itching, nausea, etc)  Fever over 101.5 F (38.5 C) Inability to urinate Nausea and/or vomiting Worsening swelling or bruising Continued bleeding from incision. Increased pain, redness, or drainage from the incision  The clinic staff is available to answer your questions during regular business hours (8:30am-5pm).  Please don't hesitate to call and ask to speak to one of our nurses for clinical concerns.   A surgeon from [facility] is always on call at the hospitals   If you have a medical emergency, go to the nearest emergency room or call 911.    [facility] [address], [address], [place], [place]  [address] ? MAIN: [phone] ? TOLL FREE: [phone] ? FAX [phone] [email or web address]  #####################################################

## 2021-04-06 NOTE — Transfer of Care (Signed)
Immediate Anesthesia Transfer of Care Note  Patient: Jonathon Griffin  Procedure(s) Performed: LIFT REPAIR OF Intersphenteric perirectal FISTULA. HEMORRHOID ligation. ANORECTAL EXAMINATION UNDER ANESTHESIA  Patient Location: PACU  Anesthesia Type:General  Level of Consciousness: drowsy  Airway & Oxygen Therapy: Patient Spontanous Breathing and Patient connected to face mask  Post-op Assessment: Report given to RN and Post -op Vital signs reviewed and stable  Post vital signs: Reviewed and stable  Last Vitals:  Vitals Value Taken Time  BP 132/70 04/06/21 1317  Temp    Pulse 70 04/06/21 1322  Resp 19 04/06/21 1322  SpO2 100 % 04/06/21 1322  Vitals shown include unvalidated device data.  Last Pain:  Vitals:   04/06/21 0913  TempSrc: Oral  PainSc: 6       Patients Stated Pain Goal:  (3) (04/06/21 0913)  Complications: No notable events documented.

## 2021-04-06 NOTE — Anesthesia Procedure Notes (Signed)
Procedure Name: Intubation Date/Time: 04/06/2021 12:00 PM Performed by: Vanessa Slocomb, CRNA Pre-anesthesia Checklist: Patient identified, Emergency Drugs available, Suction available and Patient being monitored Patient Re-evaluated:Patient Re-evaluated prior to induction Oxygen Delivery Method: Circle system utilized Preoxygenation: Pre-oxygenation with 100% oxygen Induction Type: IV induction Ventilation: Mask ventilation without difficulty Laryngoscope Size: 2 and Miller Grade View: Grade I Tube type: Oral Tube size: 7.5 mm Number of attempts: 1 Airway Equipment and Method: Stylet Placement Confirmation: ETT inserted through vocal cords under direct vision, positive ETCO2 and breath sounds checked- equal and bilateral Secured at: 21 cm Tube secured with: Tape Dental Injury: Teeth and Oropharynx as per pre-operative assessment

## 2021-04-07 ENCOUNTER — Encounter (HOSPITAL_COMMUNITY): Payer: Self-pay | Admitting: Surgery

## 2021-04-07 LAB — SURGICAL PATHOLOGY

## 2021-04-19 NOTE — Progress Notes (Signed)
Cardiology Office Note:   Date:  04/20/2021  NAME:  Jonathon Griffin    MRN: [id number] DOB:  [birth year]   PCP:  Cassandria Anger, MD  Cardiologist:  Evalina Field, MD  Electrophysiologist:  None   Referring MD: Cassandria Anger, MD   Chief Complaint  Patient presents with   Follow-up    6 months.   History of Present Illness:   Jonathon Griffin is a 65 y.o. male with a hx of CAD (inferior STEMI), CVA, HLD who presents for follow-up.  He reports he is doing well.  He recently had hemorrhoid surgery as well as anal fistula surgery.  He reports he is slowly recovering.  He denies any chest pain or trouble breathing.  He is still working as a Building control surveyor.  No major issues reported.  He has not had his lipids checked this year.  Cholesterol level was controlled last year.  He reports he is getting plan exercise at work.  He can lift heavy objects and do most things without limitations.  His blood pressure is 122/76.  He remains on aspirin statin beta-blocker.  No concerns for angina.  Problem List 1. Cardiac Arrest 04/06/2019 2/2 inferior STEMI ([place]) -s/p PCI to RCA -course c/b aspiration PNA, acute liver injury, resp failure requiring trach, protein deficiency requiring PEG 2. Multiple strokes (subcortical R occipital lobe/R cerebellar) -in setting of cardiac arrest  -negative monitor for Afib 08/25/2019 3. Hyperlipidemia  -T chol 119, HDL 39, LDL 56, TG 140  Past Medical History: Past Medical History:  Diagnosis Date   Acute on chronic respiratory failure with hypoxia (HCC)    Acute ST elevation myocardial infarction (STEMI) (HCC)    Anxiety    Arthritis    Aspiration pneumonia due to gastric secretions (HCC)    Cardiac arrest (HCC)    Coronary artery disease    Depression    Embolic stroke involving anterior cerebral artery (HCC)    GERD (gastroesophageal reflux disease)    Hyperlipidemia    Hypertension    Obstructive sleep apnea    Sleep apnea      Past Surgical History: Past Surgical History:  Procedure Laterality Date   bicep surgery     CARDIAC CATHETERIZATION     EVALUATION UNDER ANESTHESIA WITH HEMORRHOIDECTOMY N/A 04/06/2021   Procedure: LIFT REPAIR OF Intersphenteric perirectal FISTULA. HEMORRHOID ligation. ANORECTAL EXAMINATION UNDER ANESTHESIA;  Surgeon: Michael Boston, MD;  Location: [facility];  Service: General;  Laterality: N/A;  GEN w/ERAS PATHWAY LOCAL   PEG PLACEMENT     TRACHEOSTOMY      Current Medications: Current Meds  Medication Sig   acetaminophen (TYLENOL) 500 MG tablet Take 500 mg by mouth every 6 (six) hours as needed for moderate pain.   aspirin EC 81 MG tablet Take 1 tablet (81 mg total) by mouth daily.   atorvastatin (LIPITOR) 80 MG tablet TAKE 1 TABLET BY MOUTH EVERY DAY AT 6PM   chlorhexidine (HIBICLENS) 4 % external liquid Apply topically daily as needed.   Cholecalciferol (VITAMIN D3) 50 MCG (2000 UT) capsule Take 1 capsule (2,000 Units total) by mouth daily.   diazepam (VALIUM) 5 MG tablet Take 1 tablet (5 mg total) by mouth every 8 (eight) hours as needed for muscle spasms (difficulty urinating).   famotidine (PEPCID) 20 MG tablet TAKE 1 TABLET BY MOUTH TWICE A DAY   HYDROcodone-acetaminophen (NORCO) 5-325 MG tablet Take 1-2 tablets by mouth every 6 (six) hours as needed for moderate  pain or severe pain.   lamoTRIgine (LAMICTAL) 100 MG tablet Take 1 tablet (100 mg total) by mouth daily.   metoprolol succinate (TOPROL-XL) 25 MG 24 hr tablet TAKE 1 TABLET BY MOUTH EVERY DAY   tadalafil (CIALIS) 5 MG tablet Take 1 tablet (5 mg total) by mouth daily. (Patient taking differently: Take 5 mg by mouth daily as needed for erectile dysfunction.)   traMADol (ULTRAM) 50 MG tablet Take 1 tablet (50 mg total) by mouth every 6 (six) hours as needed for severe pain.   traZODone (DESYREL) 50 MG tablet Take 1-2 tablets (50-100 mg total) by mouth at bedtime as needed. for sleep   triamcinolone ointment (KENALOG) 0.1  % Apply 1 application topically 2 (two) times daily. (Patient taking differently: Apply 1 application topically 2 (two) times daily as needed (irritation).)   zolpidem (AMBIEN) 10 MG tablet Take 1 tablet (10 mg total) by mouth at bedtime as needed for sleep.     Allergies:    Nsaids, Oxycodone, and Wellbutrin [bupropion]   Social History: Social History   Socioeconomic History   Marital status: Married    Spouse name: Not on file   Number of children: Not on file   Years of education: Not on file   Highest education level: Not on file  Occupational History   Occupation: welder  Tobacco Use   Smoking status: Never   Smokeless tobacco: Never  Vaping Use   Vaping Use: Never used  Substance and Sexual Activity   Alcohol use: No   Drug use: No   Sexual activity: Yes  Other Topics Concern   Not on file  Social History Narrative   Not on file   Social Determinants of Health   Financial Resource Strain: Not on file  Food Insecurity: Not on file  Transportation Needs: Not on file  Physical Activity: Not on file  Stress: Not on file  Social Connections: Not on file     Family History: The patient's family history includes Arthritis in his mother and sister.  ROS:   All other ROS reviewed and negative. Pertinent positives noted in the HPI.     EKGs/Labs/Other Studies Reviewed:   The following studies were personally reviewed by me today:   TTE 05/28/2019  1. Left ventricular ejection fraction, by visual estimation, is 50 to  55%. The left ventricle has low normal function. There is no left  ventricular hypertrophy.   2. Basal inferoseptal segment, basal inferolateral segment, basal  inferior segment, and mid inferolateral segment are abnormal.   3. Left ventricular diastolic parameters are indeterminate.   4. The left ventricle demonstrates regional wall motion abnormalities.   5. Global right ventricle has low normal systolic function.The right  ventricular size is  normal. No increase in right ventricular wall  thickness.   6. Left atrial size was normal.   7. Right atrial size was normal.   8. The mitral valve is grossly normal. Trivial mitral valve  regurgitation.   9. The tricuspid valve is grossly normal.  10. The aortic valve is tricuspid. Aortic valve regurgitation is trivial.  11. The pulmonic valve was grossly normal. Pulmonic valve regurgitation is  trivial.  12. TR signal is inadequate for assessing pulmonary artery systolic  pressure.  13. The inferior vena cava is normal in size with greater than 50%  respiratory variability, suggesting right atrial pressure of 3 mmHg.   Recent Labs: 04/04/2021: BUN 15; Creatinine, Ser 1.06; Hemoglobin 13.9; Platelets 190; Potassium 4.4; Sodium  138   Recent Lipid Panel    Component Value Date/Time   CHOL 110 03/24/2020 1641   CHOL 119 06/09/2019 1021   TRIG 223.0 (H) 03/24/2020 1641   HDL 38.60 (L) 03/24/2020 1641   HDL 39 (L) 06/09/2019 1021   CHOLHDL 3 03/24/2020 1641   VLDL 44.6 (H) 03/24/2020 1641   LDLCALC 56 06/09/2019 1021   LDLDIRECT 51.0 03/24/2020 1641    Physical Exam:   VS:  BP 122/76 (BP Location: Left Arm, Patient Position: Sitting, Cuff Size: Normal)   Pulse (!) 56   Ht 5\' 10"  (1.778 m)   Wt 205 lb (93 kg)   BMI 29.41 kg/m    Wt Readings from Last 3 Encounters:  04/20/21 205 lb (93 kg)  04/04/21 204 lb 8 oz (92.8 kg)  03/22/21 209 lb 6.4 oz (95 kg)    General: Well nourished, well developed, in no acute distress Head: Atraumatic, normal size  Eyes: PEERLA, EOMI  Neck: Supple, no JVD Endocrine: No thryomegaly Cardiac: Normal S1, S2; RRR; no murmurs, rubs, or gallops Lungs: Clear to auscultation bilaterally, no wheezing, rhonchi or rales  Abd: Soft, nontender, no hepatomegaly  Ext: No edema, pulses 2+ Musculoskeletal: No deformities, BUE and BLE strength normal and equal Skin: Warm and dry, no rashes   Neuro: Alert and oriented to person, place, time, and  situation, CNII-XII grossly intact, no focal deficits  Psych: Normal mood and affect   ASSESSMENT:   Jonathon Griffin is a 65 y.o. male who presents for the following: 1. Coronary artery disease involving native coronary artery of native heart without angina pectoris   2. Mixed hyperlipidemia   3. Essential hypertension     PLAN:   1. Coronary artery disease involving native coronary artery of native heart without angina pectoris 2. Mixed hyperlipidemia 3. Essential hypertension -He suffered a cardiac arrest from inferior STEMI on 04/06/2019 in [place] [facility] was also complicated by multiple strokes which were attributed to his cardiac arrest.  His monitor was negative for any A. fib. -He also had a rough course with aspiration pneumonia acute liver injury and respiratory failure requiring tracheostomy and gastrostomy tube.  [facility] -Echocardiogram showed EF 45 to 50% with wall motion abnormalities in the RCA distribution.  No symptoms of congestive heart failure.  He is done very well since his heart attack.  No chest pain or trouble breathing reported.  He is getting plenty of activity.  His blood pressure is well controlled today in office.  We will continue aspirin 81 mg indefinitely.  He is on Lipitor 80 mg daily.  He does need a repeat fasting lipid profile this year.  He is not fasting today but we will have him come back in the next few weeks for fasting lipid profile.  I have also recommended he continue metoprolol succinate 25 mg daily.  His blood pressure is well controlled and he has no symptoms of angina.  He overall has done remarkably well since his event.  He is doing everything he can do to another cardiac event.   Disposition: Return in about 1 year (around 04/20/2022).  Medication Adjustments/Labs and Tests Ordered: Current medicines are reviewed at length with the patient today.  Concerns  regarding medicines are outlined above.  Orders Placed This Encounter  Procedures   Lipid panel    No orders of the defined types were placed in this  encounter.   Patient Instructions  Medication Instructions:  The current medical regimen is effective;  continue present plan and medications.  *If you need a refill on your cardiac medications before your next appointment, please call your pharmacy*   Lab Work: LIPID (be fasting, nothing to eat or drink)   If you have labs (blood work) drawn today and your tests are completely normal, you will receive your results only by: MyChart Message (if you have MyChart) OR A paper copy in the mail If you have any lab test that is abnormal or we need to change your treatment, we will call you to review the results.   Follow-Up: At [facility], you and your health needs are our priority.  As part of our continuing mission to provide you with exceptional heart care, we have created designated Provider Care Teams.  These Care Teams include your primary Cardiologist (physician) and Advanced Practice Providers (APPs -  Physician Assistants and Nurse Practitioners) who all work together to provide you with the care you need, when you need it.  We recommend signing up for the patient portal called "MyChart".  Sign up information is provided on this After Visit Summary.  MyChart is used to connect with patients for Virtual Visits (Telemedicine).  Patients are able to view lab/test results, encounter notes, upcoming appointments, etc.  Non-urgent messages can be sent to your provider as well.   To learn more about what you can do with MyChart, go to [email or web address].    Your next appointment:   12 month(s)  The format for your next appointment:   In Person  Provider:   Reatha Harps, MD      Time Spent with Patient: I have spent a total of 25 minutes with patient reviewing hospital notes, telemetry, EKGs, labs and examining the patient  as well as establishing an assessment and plan that was discussed with the patient.  > 50% of time was spent in direct patient care.  Signed, Lenna Gilford. Flora Lipps, MD, [facility]  [facility]  [address], [address] [place], [place] [address] [phone]  04/20/2021 4:24 PM

## 2021-04-20 ENCOUNTER — Other Ambulatory Visit: Payer: Self-pay

## 2021-04-20 ENCOUNTER — Encounter: Payer: Self-pay | Admitting: Cardiovascular Disease

## 2021-04-20 ENCOUNTER — Ambulatory Visit (INDEPENDENT_AMBULATORY_CARE_PROVIDER_SITE_OTHER): Payer: Managed Care, Other (non HMO) | Admitting: Cardiovascular Disease

## 2021-04-20 VITALS — BP 122/76 | HR 56 | Ht 70.0 in | Wt 205.0 lb

## 2021-04-20 DIAGNOSIS — I10 Essential (primary) hypertension: Secondary | ICD-10-CM | POA: Diagnosis not present

## 2021-04-20 DIAGNOSIS — I2510 Atherosclerotic heart disease of native coronary artery without angina pectoris: Secondary | ICD-10-CM | POA: Diagnosis not present

## 2021-04-20 DIAGNOSIS — E782 Mixed hyperlipidemia: Secondary | ICD-10-CM | POA: Diagnosis not present

## 2021-04-20 NOTE — Patient Instructions (Signed)
Medication Instructions:  The current medical regimen is effective;  continue present plan and medications.  *If you need a refill on your cardiac medications before your next appointment, please call your pharmacy*   Lab Work: LIPID (be fasting, nothing to eat or drink)   If you have labs (blood work) drawn today and your tests are completely normal, you will receive your results only by: MyChart Message (if you have MyChart) OR A paper copy in the mail If you have any lab test that is abnormal or we need to change your treatment, we will call you to review the results.   Follow-Up: At [facility], you and your health needs are our priority.  As part of our continuing mission to provide you with exceptional heart care, we have created designated Provider Care Teams.  These Care Teams include your primary Cardiologist (physician) and Advanced Practice Providers (APPs -  Physician Assistants and Nurse Practitioners) who all work together to provide you with the care you need, when you need it.  We recommend signing up for the patient portal called "MyChart".  Sign up information is provided on this After Visit Summary.  MyChart is used to connect with patients for Virtual Visits (Telemedicine).  Patients are able to view lab/test results, encounter notes, upcoming appointments, etc.  Non-urgent messages can be sent to your provider as well.   To learn more about what you can do with MyChart, go to [email or web address].    Your next appointment:   12 month(s)  The format for your next appointment:   In Person  Provider:   Reatha Harps, MD

## 2021-05-02 ENCOUNTER — Other Ambulatory Visit (INDEPENDENT_AMBULATORY_CARE_PROVIDER_SITE_OTHER): Payer: Managed Care, Other (non HMO)

## 2021-05-02 ENCOUNTER — Other Ambulatory Visit: Payer: Self-pay

## 2021-05-02 DIAGNOSIS — Z0000 Encounter for general adult medical examination without abnormal findings: Secondary | ICD-10-CM | POA: Diagnosis not present

## 2021-05-02 DIAGNOSIS — I252 Old myocardial infarction: Secondary | ICD-10-CM | POA: Insufficient documentation

## 2021-05-02 LAB — COMPREHENSIVE METABOLIC PANEL
ALT: 35 U/L (ref 0–53)
AST: 21 U/L (ref 0–37)
Albumin: 4.1 g/dL (ref 3.5–5.2)
Alkaline Phosphatase: 71 U/L (ref 39–117)
BUN: 13 mg/dL (ref 6–23)
CO2: 29 mEq/L (ref 19–32)
Calcium: 9.8 mg/dL (ref 8.4–10.5)
Chloride: 104 mEq/L (ref 96–112)
Creatinine, Ser: 1.22 mg/dL (ref 0.40–1.50)
GFR: 63.02 mL/min (ref >60.00)
Glucose, Bld: 107 mg/dL — ABNORMAL HIGH (ref 70–99)
Potassium: 4.5 mEq/L (ref 3.5–5.1)
Sodium: 141 mEq/L (ref 135–145)
Total Bilirubin: 0.6 mg/dL (ref 0.2–1.2)
Total Protein: 6.5 g/dL (ref 6.0–8.3)

## 2021-05-02 LAB — CBC WITH DIFFERENTIAL/PLATELET
Basophils Absolute: 0.1 10*3/uL (ref 0.0–0.1)
Basophils Relative: 0.7 % (ref 0.0–3.0)
Eosinophils Absolute: 0.2 10*3/uL (ref 0.0–0.7)
Eosinophils Relative: 3.2 % (ref 0.0–5.0)
HCT: 39.9 % (ref 39.0–52.0)
Hemoglobin: 13 g/dL (ref 13.0–17.0)
Lymphocytes Relative: 25.4 % (ref 12.0–46.0)
Lymphs Abs: 1.9 10*3/uL (ref 0.7–4.0)
MCHC: 32.7 g/dL (ref 30.0–36.0)
MCV: 92.1 fl (ref 78.0–100.0)
Monocytes Absolute: 0.6 10*3/uL (ref 0.1–1.0)
Monocytes Relative: 8.4 % (ref 3.0–12.0)
Neutro Abs: 4.7 10*3/uL (ref 1.4–7.7)
Neutrophils Relative %: 62.3 % (ref 43.0–77.0)
Platelets: 189 10*3/uL (ref 150.0–400.0)
RBC: 4.33 Mil/uL (ref 4.22–5.81)
RDW: 15.1 % (ref 11.5–15.5)
WBC: 7.5 10*3/uL (ref 4.0–10.5)

## 2021-05-02 LAB — LIPID PANEL
Cholesterol: 128 mg/dL (ref 0–200)
HDL: 43.6 mg/dL (ref >39.00)
LDL Cholesterol: 51 mg/dL (ref 0–99)
NonHDL: 84.89
Total CHOL/HDL Ratio: 3
Triglycerides: 168 mg/dL — ABNORMAL HIGH (ref 0.0–149.0)
VLDL: 33.6 mg/dL (ref 0.0–40.0)

## 2021-05-02 LAB — PSA: PSA: 0.6 ng/mL (ref 0.10–4.00)

## 2021-05-02 LAB — URINALYSIS
Bilirubin Urine: NEGATIVE
Hgb urine dipstick: NEGATIVE
Ketones, ur: NEGATIVE
Leukocytes,Ua: NEGATIVE
Nitrite: NEGATIVE
Specific Gravity, Urine: 1.025 (ref 1.000–1.030)
Total Protein, Urine: NEGATIVE
Urine Glucose: NEGATIVE
Urobilinogen, UA: 0.2 (ref 0.0–1.0)
pH: 6 (ref 5.0–8.0)

## 2021-05-02 LAB — TSH: TSH: 1.97 u[IU]/mL (ref 0.35–5.50)

## 2021-05-19 ENCOUNTER — Telehealth: Payer: Self-pay | Admitting: Internal Medicine

## 2021-05-19 MED ORDER — ZOLPIDEM TARTRATE 10 MG PO TABS: 10.0000 mg | ORAL_TABLET | Freq: Every evening | ORAL | 1 refills | Status: DC | PRN

## 2021-05-19 NOTE — Telephone Encounter (Signed)
Patient states he received a hard copy rx for zolpidem (AMBIEN) 10 MG tablet (Expired)  Patient states he did not get the rx filled and it has expired  Patient requesting picking up another  hard copy or rx sent to pharmacy   Pharmacy [id number] [place], [place] - [address]

## 2021-05-19 NOTE — Telephone Encounter (Signed)
Rx emailed Thx 

## 2021-06-25 IMAGING — DX DG CHEST 1V PORT
1 series · 1 of 1 positions shown · non-contrast
Comparison: 07/07/14

CLINICAL DATA: Respiratory failure

EXAM:
PORTABLE CHEST 1 VIEW

[chest ap]
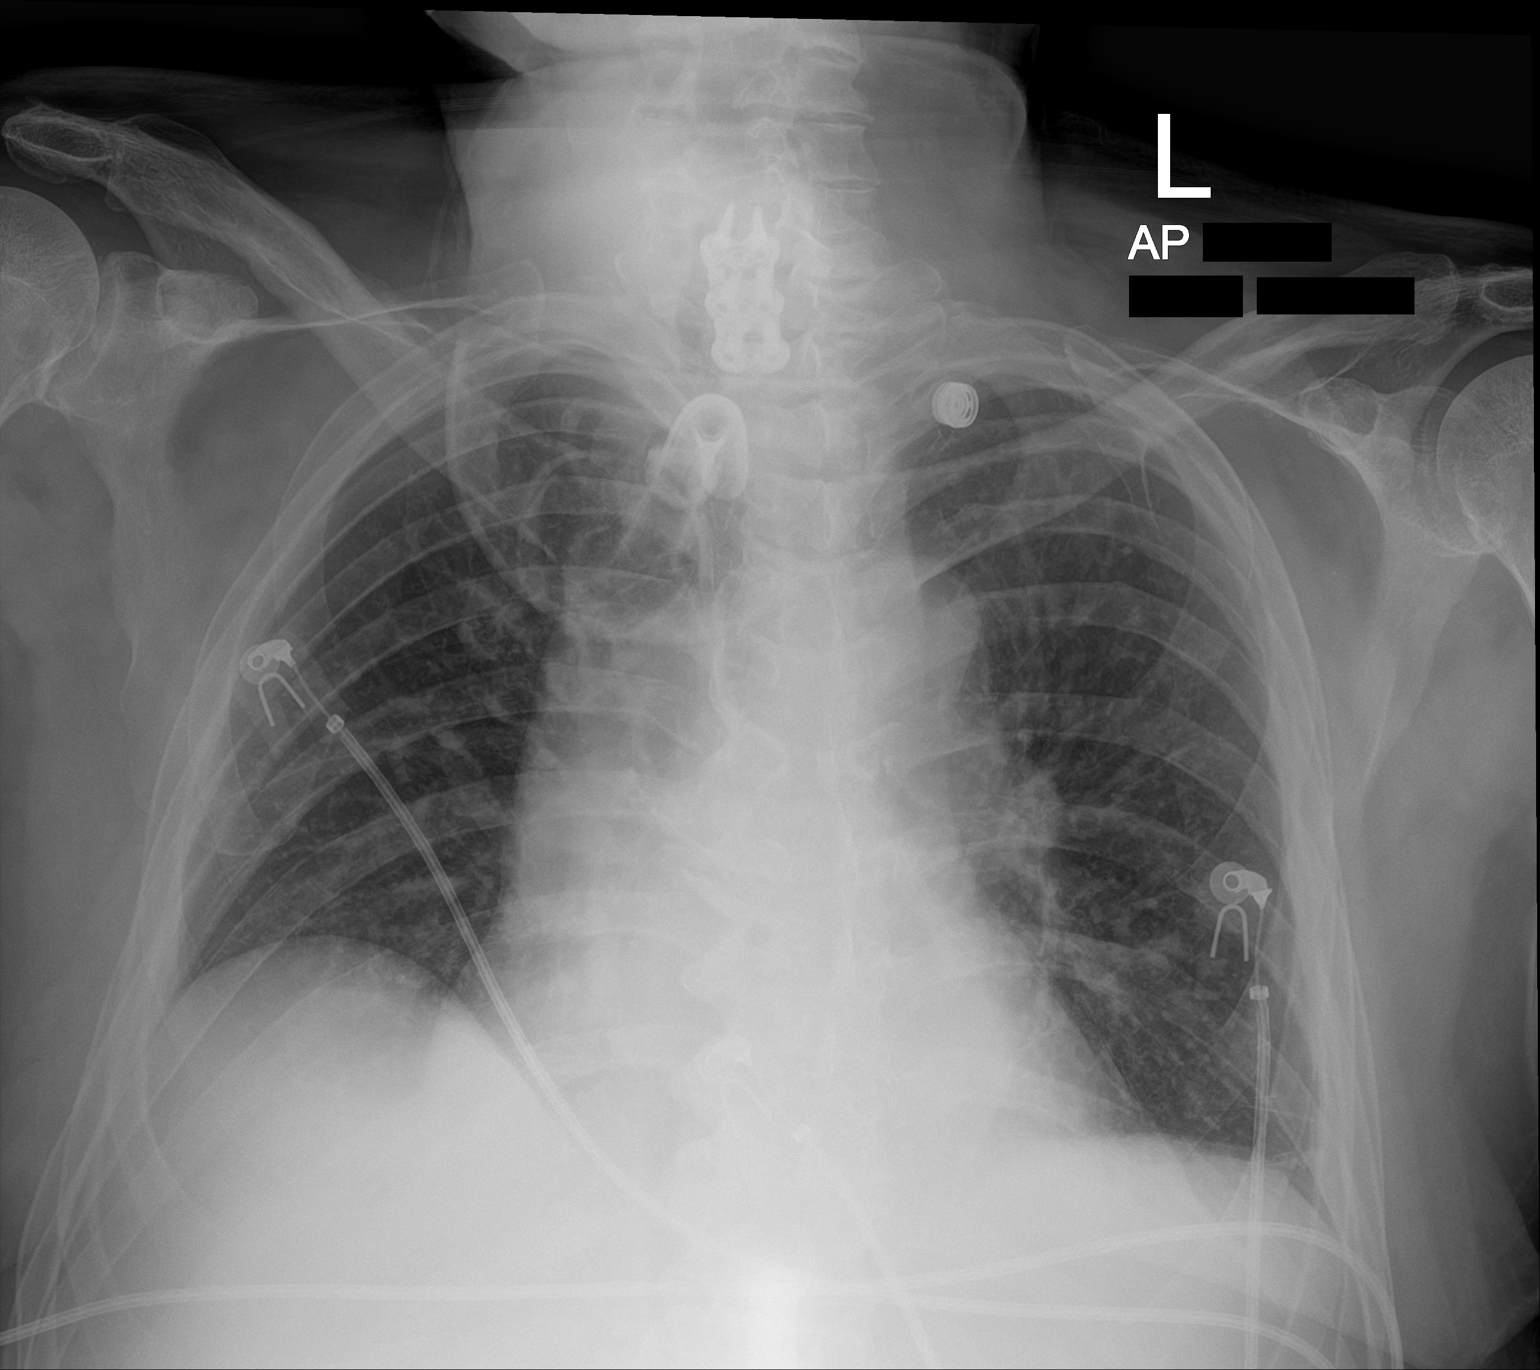

[1 of 1 positions shown; findings below may reference images not displayed]

FINDINGS: Tracheostomy tube is noted in satisfactory position. Postsurgical
changes in the cervical spine are seen. Cardiac shadow is
accentuated by the portable technique but likely within normal
limits. The lungs are well aerated bilaterally. No focal infiltrate
or effusion is seen. No bony abnormality is noted.
IMPRESSION: No acute abnormality noted.

## 2021-07-02 ENCOUNTER — Other Ambulatory Visit: Payer: Self-pay | Admitting: Cardiovascular Disease

## 2021-07-20 ENCOUNTER — Ambulatory Visit (INDEPENDENT_AMBULATORY_CARE_PROVIDER_SITE_OTHER): Payer: Managed Care, Other (non HMO) | Admitting: Internal Medicine

## 2021-07-20 ENCOUNTER — Encounter: Payer: Self-pay | Admitting: Internal Medicine

## 2021-07-20 ENCOUNTER — Other Ambulatory Visit: Payer: Self-pay

## 2021-07-20 VITALS — BP 122/72 | HR 55 | Temp 98.3°F | Ht 70.0 in | Wt 210.0 lb

## 2021-07-20 DIAGNOSIS — I10 Essential (primary) hypertension: Secondary | ICD-10-CM

## 2021-07-20 DIAGNOSIS — G4700 Insomnia, unspecified: Secondary | ICD-10-CM | POA: Diagnosis not present

## 2021-07-20 DIAGNOSIS — M2550 Pain in unspecified joint: Secondary | ICD-10-CM

## 2021-07-20 DIAGNOSIS — R739 Hyperglycemia, unspecified: Secondary | ICD-10-CM

## 2021-07-20 DIAGNOSIS — R635 Abnormal weight gain: Secondary | ICD-10-CM

## 2021-07-20 DIAGNOSIS — K921 Melena: Secondary | ICD-10-CM

## 2021-07-20 MED ORDER — TRAMADOL HCL 50 MG PO TABS: 50.0000 mg | ORAL_TABLET | Freq: Four times a day (QID) | ORAL | 2 refills | Status: DC | PRN

## 2021-07-20 MED ORDER — TADALAFIL 5 MG PO TABS: 5.0000 mg | ORAL_TABLET | Freq: Every day | ORAL | 3 refills | Status: DC

## 2021-07-20 NOTE — Assessment & Plan Note (Signed)
BP Readings from Last 3 Encounters:  ?07/20/21 122/72  ?04/20/21 122/76  ?04/06/21 (!) 140/93  ? ? ?

## 2021-07-20 NOTE — Assessment & Plan Note (Signed)
Chronic  °Continue with zolpidem prn ° Potential benefits of a long term benzodiazepines  use as well as potential risks  and complications were explained to the patient and were aknowledged. °

## 2021-07-20 NOTE — Progress Notes (Signed)
? ?Subjective:  ?Patient ID: Jonathon Griffin, male    DOB: August 20, 1955  Age: 66 y.o. MRN: [id number] ? ?CC: Follow-up (No concerns) ? ? ?HPI ?Jonathon Griffin presents for hyperglycemia, wt gain, obesity, OA ? ?Outpatient Medications Prior to Visit  ?Medication Sig Dispense Refill  ? acetaminophen (TYLENOL) 500 MG tablet Take 500 mg by mouth every 6 (six) hours as needed for moderate pain.    ? aspirin EC 81 MG tablet Take 1 tablet (81 mg total) by mouth daily. 90 tablet 3  ? atorvastatin (LIPITOR) 80 MG tablet TAKE 1 TABLET BY MOUTH EVERY DAY AT 6PM 90 tablet 3  ? chlorhexidine (HIBICLENS) 4 % external liquid Apply topically daily as needed. 950 mL 0  ? Cholecalciferol (VITAMIN D3) 50 MCG (2000 UT) capsule Take 1 capsule (2,000 Units total) by mouth daily. 100 capsule 3  ? diazepam (VALIUM) 5 MG tablet Take 1 tablet (5 mg total) by mouth every 8 (eight) hours as needed for muscle spasms (difficulty urinating). 6 tablet 1  ? famotidine (PEPCID) 20 MG tablet TAKE 1 TABLET BY MOUTH TWICE A DAY 180 tablet 2  ? HYDROcodone-acetaminophen (NORCO) 5-325 MG tablet Take 1-2 tablets by mouth every 6 (six) hours as needed for moderate pain or severe pain. 30 tablet 0  ? lamoTRIgine (LAMICTAL) 100 MG tablet Take 1 tablet (100 mg total) by mouth daily. 90 tablet 1  ? metoprolol succinate (TOPROL-XL) 25 MG 24 hr tablet TAKE 1 TABLET BY MOUTH EVERY DAY 90 tablet 2  ? traZODone (DESYREL) 50 MG tablet Take 1-2 tablets (50-100 mg total) by mouth at bedtime as needed. for sleep 180 tablet 1  ? triamcinolone ointment (KENALOG) 0.1 % Apply 1 application topically 2 (two) times daily. (Patient taking differently: Apply 1 application topically 2 (two) times daily as needed (irritation).) 80 g 1  ? tadalafil (CIALIS) 5 MG tablet Take 1 tablet (5 mg total) by mouth daily. (Patient taking differently: Take 5 mg by mouth daily as needed for erectile dysfunction.) 90 tablet 3  ? traMADol (ULTRAM) 50 MG tablet Take 1 tablet (50 mg total) by mouth  every 6 (six) hours as needed for severe pain. 90 tablet 2  ? zolpidem (AMBIEN) 10 MG tablet Take 1 tablet (10 mg total) by mouth at bedtime as needed for sleep. 90 tablet 1  ? ?No facility-administered medications prior to visit.  ? ? ?ROS: ?Review of Systems  ?Constitutional:  Positive for fatigue and unexpected weight change. Negative for appetite change.  ?HENT:  Negative for congestion, nosebleeds, sneezing, sore throat and trouble swallowing.   ?Eyes:  Negative for itching and visual disturbance.  ?Respiratory:  Negative for cough.   ?Cardiovascular:  Negative for chest pain, palpitations and leg swelling.  ?Gastrointestinal:  Negative for abdominal distention, blood in stool, diarrhea and nausea.  ?Genitourinary:  Negative for frequency and hematuria.  ?Musculoskeletal:  Positive for arthralgias and back pain. Negative for gait problem, joint swelling and neck pain.  ?Skin:  Negative for rash.  ?Neurological:  Negative for dizziness, tremors, speech difficulty and weakness.  ?Psychiatric/Behavioral:  Negative for agitation, dysphoric mood and sleep disturbance. The patient is not nervous/anxious.   ? ?Objective:  ?BP 122/72 (BP Location: Left Arm, Patient Position: Sitting, Cuff Size: Large)   Pulse (!) 55   Temp 98.3 ?F (36.8 ?C) (Oral)   Ht 5\' 10"  (1.778 m)   Wt 210 lb (95.3 kg)   SpO2 98%   BMI 30.13 kg/m?  ? ?BP  Readings from Last 3 Encounters:  ?07/20/21 122/72  ?04/20/21 122/76  ?04/06/21 (!) 140/93  ? ? ?Wt Readings from Last 3 Encounters:  ?07/20/21 210 lb (95.3 kg)  ?04/20/21 205 lb (93 kg)  ?04/04/21 204 lb 8 oz (92.8 kg)  ? ? ?Physical Exam ?Constitutional:   ?   General: He is not in acute distress. ?   Appearance: He is well-developed. He is obese. He is not diaphoretic.  ?HENT:  ?   Head: Normocephalic and atraumatic.  ?   Right Ear: External ear normal.  ?   Left Ear: External ear normal.  ?   Nose: Nose normal.  ?   Mouth/Throat:  ?   Pharynx: No oropharyngeal exudate.  ?Eyes:  ?    General: No scleral icterus.    ?   Right eye: No discharge.     ?   Left eye: No discharge.  ?   Conjunctiva/sclera: Conjunctivae normal.  ?   Pupils: Pupils are equal, round, and reactive to light.  ?Neck:  ?   Thyroid: No thyromegaly.  ?   Vascular: No JVD.  ?   Trachea: No tracheal deviation.  ?Cardiovascular:  ?   Rate and Rhythm: Normal rate and regular rhythm.  ?   Heart sounds: Normal heart sounds. No murmur heard. ?  No friction rub. No gallop.  ?Pulmonary:  ?   Effort: Pulmonary effort is normal. No respiratory distress.  ?   Breath sounds: Normal breath sounds. No stridor. No wheezing or rales.  ?Chest:  ?   Chest wall: No tenderness.  ?Abdominal:  ?   General: Bowel sounds are normal. There is no distension.  ?   Palpations: Abdomen is soft. There is no mass.  ?   Tenderness: There is no abdominal tenderness. There is no guarding or rebound.  ?Genitourinary: ?   Penis: No tenderness.   ?Musculoskeletal:     ?   General: Tenderness present. Normal range of motion.  ?   Cervical back: Normal range of motion and neck supple.  ?Lymphadenopathy:  ?   Cervical: No cervical adenopathy.  ?Skin: ?   General: Skin is warm and dry.  ?   Coloration: Skin is not pale.  ?   Findings: No erythema or rash.  ?Neurological:  ?   Mental Status: He is alert and oriented to person, place, and time.  ?   Cranial Nerves: No cranial nerve deficit.  ?   Motor: No abnormal muscle tone.  ?   Coordination: Coordination normal.  ?   Deep Tendon Reflexes: Reflexes are normal and symmetric. Reflexes normal.  ?Psychiatric:     ?   Behavior: Behavior normal.     ?   Thought Content: Thought content normal.     ?   Judgment: Judgment normal.  ? ? ?Lab Results  ?Component Value Date  ? WBC 7.5 05/02/2021  ? HGB 13.0 05/02/2021  ? HCT 39.9 05/02/2021  ? PLT 189.0 05/02/2021  ? GLUCOSE 107 (H) 05/02/2021  ? CHOL 128 05/02/2021  ? TRIG 168.0 (H) 05/02/2021  ? HDL 43.60 05/02/2021  ? LDLDIRECT 51.0 03/24/2020  ? LDLCALC 51 05/02/2021  ? ALT  35 05/02/2021  ? AST 21 05/02/2021  ? NA 141 05/02/2021  ? K 4.5 05/02/2021  ? CL 104 05/02/2021  ? CREATININE 1.22 05/02/2021  ? BUN 13 05/02/2021  ? CO2 29 05/02/2021  ? TSH 1.97 05/02/2021  ? PSA 0.60 05/02/2021  ? INR 2.3 (  H) 05/14/2019  ? ? ?No results found. ? ?Assessment & Plan:  ? ?Problem List Items Addressed This Visit   ? ? Arthralgia  ?  Chronic ?Continue with tramadol prn ? Potential benefits of a long term opioids use as well as potential risks (i.e. addiction risk, apnea etc) and complications (i.e. Somnolence, constipation and others) were explained to the patient and were aknowledged. ?  ?  ? Essential hypertension  ?  BP Readings from Last 3 Encounters:  ?07/20/21 122/72  ?04/20/21 122/76  ?04/06/21 (!) 140/93  ? ?  ?  ? Relevant Medications  ? tadalafil (CIALIS) 5 MG tablet  ? Other Relevant Orders  ? Hemoglobin A1c  ? Hematochezia  ?  S/p repair 2022 ?  ?  ? Insomnia  ?  Chronic ?Continue with zolpidem prn ? Potential benefits of a long term benzodiazepines  use as well as potential risks  and complications were explained to the patient and were aknowledged. ? ?  ?  ? Weight gain  ?  Low carb diet advised ?Monitor A1c, TG ?Walk >5000 steps a day ?  ?  ? Relevant Orders  ? Comprehensive metabolic panel  ? Hemoglobin A1c  ? ?Other Visit Diagnoses   ? ? Hyperglycemia    -  Primary  ? Relevant Orders  ? Comprehensive metabolic panel  ? Hemoglobin A1c  ? ?  ?  ? ? ?Meds ordered this encounter  ?Medications  ? tadalafil (CIALIS) 5 MG tablet  ?  Sig: Take 1 tablet (5 mg total) by mouth daily.  ?  Dispense:  90 tablet  ?  Refill:  3  ? traMADol (ULTRAM) 50 MG tablet  ?  Sig: Take 1 tablet (50 mg total) by mouth every 6 (six) hours as needed for severe pain.  ?  Dispense:  120 tablet  ?  Refill:  2  ?  ? ? ?Follow-up: Return in about 3 months (around 10/20/2021) for a follow-up visit. ? ?Sonda Primes, MD ?

## 2021-07-20 NOTE — Assessment & Plan Note (Addendum)
S/p repair 2022 ?

## 2021-07-20 NOTE — Patient Instructions (Addendum)
Walk >5000 steps a day ? ?These suggestions will probably help you to improve your metabolism if you are not overweight and to lose weight if you are overweight: ?1.  Reduce your consumption of sugars and starches.  Eliminate high fructose corn syrup from your diet.  Reduce your consumption of processed foods.  For desserts try to have seasonal fruits, berries, nuts, cheeses or dark chocolate with more than 70% cacao. ?2.  Do not snack ?3.  You do not have to eat breakfast.  If you choose to have breakfast - eat plain greek yogurt, eggs, oatmeal (without sugar) - use honey if you need to. ?4.  Drink water, freshly brewed unsweetened tea (green, black or herbal) or coffee.  Do not drink sodas including diet sodas , juices, beverages sweetened with artificial sweeteners. ?5.  Reduce your consumption of refined grains. ?6.  Avoid protein drinks such as Optifast, Slim fast etc. Eat chicken, fish, meat, dairy and beans for your sources of protein. ?7.  Natural unprocessed fats like cold pressed virgin olive oil, butter, coconut oil are good for you.  Eat avocados. ?8.  Increase your consumption of fiber.  Fruits, berries, vegetables, whole grains, flaxseed, chia seeds, beans, popcorn, nuts, oatmeal are good sources of fiber ?9.  Use vinegar in your diet, i.e. apple cider vinegar, red wine or balsamic vinegar ?10.  You can try fasting.  For example you can skip breakfast and lunch every other day (24-hour fast) ?11.  Stress reduction, good night sleep, relaxation, meditation, yoga and other physical activity is likely to help you to maintain low weight too. ?12.  If you drink alcohol, limit your alcohol intake to no more than 2 drinks a day. ? ?

## 2021-07-20 NOTE — Assessment & Plan Note (Signed)
Chronic Continue with tramadol prn  Potential benefits of a long term opioids use as well as potential risks (i.e. addiction risk, apnea etc) and complications (i.e. Somnolence, constipation and others) were explained to the patient and were aknowledged. 

## 2021-07-20 NOTE — Assessment & Plan Note (Addendum)
Low carb diet advised ?Monitor A1c, TG ?Walk >5000 steps a day ?

## 2021-08-14 ENCOUNTER — Other Ambulatory Visit: Payer: Self-pay | Admitting: Cardiovascular Disease

## 2021-09-19 ENCOUNTER — Other Ambulatory Visit: Payer: Self-pay | Admitting: Internal Medicine

## 2021-10-20 ENCOUNTER — Encounter: Payer: Self-pay | Admitting: Internal Medicine

## 2021-10-20 ENCOUNTER — Ambulatory Visit (INDEPENDENT_AMBULATORY_CARE_PROVIDER_SITE_OTHER): Payer: Managed Care, Other (non HMO) | Admitting: Internal Medicine

## 2021-10-20 DIAGNOSIS — G4700 Insomnia, unspecified: Secondary | ICD-10-CM

## 2021-10-20 DIAGNOSIS — I252 Old myocardial infarction: Secondary | ICD-10-CM | POA: Insufficient documentation

## 2021-10-20 DIAGNOSIS — I10 Essential (primary) hypertension: Secondary | ICD-10-CM

## 2021-10-20 DIAGNOSIS — M2550 Pain in unspecified joint: Secondary | ICD-10-CM

## 2021-10-20 DIAGNOSIS — F4321 Adjustment disorder with depressed mood: Secondary | ICD-10-CM

## 2021-10-20 DIAGNOSIS — E785 Hyperlipidemia, unspecified: Secondary | ICD-10-CM | POA: Diagnosis not present

## 2021-10-20 DIAGNOSIS — K5730 Diverticulosis of large intestine without perforation or abscess without bleeding: Secondary | ICD-10-CM | POA: Insufficient documentation

## 2021-10-20 MED ORDER — ZOLPIDEM TARTRATE 10 MG PO TABS: 10.0000 mg | ORAL_TABLET | Freq: Every evening | ORAL | 1 refills | Status: DC | PRN

## 2021-10-20 MED ORDER — TRAMADOL HCL 50 MG PO TABS: 50.0000 mg | ORAL_TABLET | Freq: Four times a day (QID) | ORAL | 2 refills | Status: DC | PRN

## 2021-10-20 NOTE — Patient Instructions (Signed)
Blue-Emu cream -- use 2-3 times a day ? ?

## 2021-10-20 NOTE — Assessment & Plan Note (Signed)
On Lipitor 

## 2021-10-20 NOTE — Assessment & Plan Note (Signed)
Discussed: Stepson died at 64 - colon cancer, his dad died too

## 2021-10-20 NOTE — Assessment & Plan Note (Signed)
Chronic Continue with tramadol prn  Potential benefits of a long term opioids use as well as potential risks (i.e. addiction risk, apnea etc) and complications (i.e. Somnolence, constipation and others) were explained to the patient and were aknowledged. Blue-Emu cream was recommended to use 2-3 times a day

## 2021-10-20 NOTE — Assessment & Plan Note (Signed)
Continue with zolpidem prn  Potential benefits of a long term benzodiazepines  use as well as potential risks  and complications were explained to the patient and were aknowledged.  

## 2021-10-20 NOTE — Assessment & Plan Note (Addendum)
BP OK at home Monitor BP

## 2021-10-20 NOTE — Progress Notes (Signed)
Subjective:  Patient ID: Jonathon Griffin, male    DOB: [birth year]  Age: 66 y.o. MRN: [id number]  CC: No chief complaint on file.   HPI Jonathon Griffin presents for CAD, dyslipidemia, OA w/pains Stepson died at 41 - colon cancer, his dad died too Outpatient Medications Prior to Visit  Medication Sig Dispense Refill   acetaminophen (TYLENOL) 500 MG tablet Take 500 mg by mouth every 6 (six) hours as needed for moderate pain.     aspirin EC 81 MG tablet Take 1 tablet (81 mg total) by mouth daily. 90 tablet 3   atorvastatin (LIPITOR) 80 MG tablet TAKE 1 TABLET BY MOUTH EVERY DAY AT 6PM 90 tablet 3   chlorhexidine (HIBICLENS) 4 % external liquid Apply topically daily as needed. 950 mL 0   Cholecalciferol (VITAMIN D3) 50 MCG (2000 UT) capsule Take 1 capsule (2,000 Units total) by mouth daily. 100 capsule 3   famotidine (PEPCID) 20 MG tablet TAKE 1 TABLET BY MOUTH TWICE A DAY 180 tablet 2   lamoTRIgine (LAMICTAL) 100 MG tablet TAKE 1 TABLET BY MOUTH EVERY DAY 90 tablet 1   metoprolol succinate (TOPROL-XL) 25 MG 24 hr tablet TAKE 1 TABLET BY MOUTH EVERY DAY 90 tablet 2   tadalafil (CIALIS) 5 MG tablet Take 1 tablet (5 mg total) by mouth daily. 90 tablet 3   diazepam (VALIUM) 5 MG tablet Take 1 tablet (5 mg total) by mouth every 8 (eight) hours as needed for muscle spasms (difficulty urinating). 6 tablet 1   HYDROcodone-acetaminophen (NORCO) 5-325 MG tablet Take 1-2 tablets by mouth every 6 (six) hours as needed for moderate pain or severe pain. 30 tablet 0   traMADol (ULTRAM) 50 MG tablet Take 1 tablet (50 mg total) by mouth every 6 (six) hours as needed for severe pain. 120 tablet 2   traZODone (DESYREL) 50 MG tablet Take 1-2 tablets (50-100 mg total) by mouth at bedtime as needed. for sleep 180 tablet 1   triamcinolone ointment (KENALOG) 0.1 % Apply 1 application topically 2 (two) times daily. (Patient taking differently: Apply 1 application. topically 2 (two) times daily as needed (irritation).)  80 g 1   zolpidem (AMBIEN) 10 MG tablet Take 1 tablet (10 mg total) by mouth at bedtime as needed for sleep. 90 tablet 1   No facility-administered medications prior to visit.    ROS: Review of Systems  Constitutional:  Negative for appetite change, fatigue and unexpected weight change.  HENT:  Negative for congestion, nosebleeds, sneezing, sore throat and trouble swallowing.   Eyes:  Negative for itching and visual disturbance.  Respiratory:  Negative for cough.   Cardiovascular:  Negative for chest pain, palpitations and leg swelling.  Gastrointestinal:  Negative for abdominal distention, blood in stool, diarrhea and nausea.  Genitourinary:  Negative for frequency and hematuria.  Musculoskeletal:  Positive for arthralgias and back pain. Negative for gait problem, joint swelling and neck pain.  Skin:  Negative for rash.  Neurological:  Negative for dizziness, tremors, speech difficulty and weakness.  Psychiatric/Behavioral:  Positive for sleep disturbance. Negative for agitation, dysphoric mood and suicidal ideas. The patient is not nervous/anxious.    Objective:  BP 140/82 (BP Location: Left Arm, Patient Position: Sitting, Cuff Size: Large)   Pulse (!) 58   Temp 98.2 F (36.8 C) (Oral)   Ht 5\' 10"  (1.778 m)   Wt 208 lb (94.3 kg)   SpO2 98%   BMI 29.84 kg/m   BP Readings from Last  3 Encounters:  10/20/21 140/82  07/20/21 122/72  04/20/21 122/76    Wt Readings from Last 3 Encounters:  10/20/21 208 lb (94.3 kg)  07/20/21 210 lb (95.3 kg)  04/20/21 205 lb (93 kg)    Physical Exam Constitutional:      General: He is not in acute distress.    Appearance: Normal appearance. He is well-developed.     Comments: NAD  Eyes:     Conjunctiva/sclera: Conjunctivae normal.     Pupils: Pupils are equal, round, and reactive to light.  Neck:     Thyroid: No thyromegaly.     Vascular: No JVD.  Cardiovascular:     Rate and Rhythm: Normal rate and regular rhythm.     Heart sounds:  Normal heart sounds. No murmur heard.   No friction rub. No gallop.  Pulmonary:     Effort: Pulmonary effort is normal. No respiratory distress.     Breath sounds: Normal breath sounds. No wheezing or rales.  Chest:     Chest wall: No tenderness.  Abdominal:     General: Bowel sounds are normal. There is no distension.     Palpations: Abdomen is soft. There is no mass.     Tenderness: There is no abdominal tenderness. There is no guarding or rebound.  Musculoskeletal:        General: No tenderness. Normal range of motion.     Cervical back: Normal range of motion.  Lymphadenopathy:     Cervical: No cervical adenopathy.  Skin:    General: Skin is warm and dry.     Findings: No rash.  Neurological:     Mental Status: He is alert and oriented to person, place, and time.     Cranial Nerves: No cranial nerve deficit.     Motor: No abnormal muscle tone.     Coordination: Coordination normal.     Gait: Gait normal.     Deep Tendon Reflexes: Reflexes are normal and symmetric.  Psychiatric:        Behavior: Behavior normal.        Thought Content: Thought content normal.        Judgment: Judgment normal.  Appears sad  Lab Results  Component Value Date   WBC 7.5 05/02/2021   HGB 13.0 05/02/2021   HCT 39.9 05/02/2021   PLT 189.0 05/02/2021   GLUCOSE 107 (H) 05/02/2021   CHOL 128 05/02/2021   TRIG 168.0 (H) 05/02/2021   HDL 43.60 05/02/2021   LDLDIRECT 51.0 03/24/2020   LDLCALC 51 05/02/2021   ALT 35 05/02/2021   AST 21 05/02/2021   NA 141 05/02/2021   K 4.5 05/02/2021   CL 104 05/02/2021   CREATININE 1.22 05/02/2021   BUN 13 05/02/2021   CO2 29 05/02/2021   TSH 1.97 05/02/2021   PSA 0.60 05/02/2021   INR 2.3 (H) 05/14/2019    No results found.  Assessment & Plan:   Problem List Items Addressed This Visit     Arthralgia    Chronic Continue with tramadol prn  Potential benefits of a long term opioids use as well as potential risks (i.e. addiction risk, apnea etc)  and complications (i.e. Somnolence, constipation and others) were explained to the patient and were aknowledged. Blue-Emu cream was recommended to use 2-3 times a day       Essential hypertension    BP OK at home Monitor BP      Grief    Discussed: Stepson died at 6 -  colon cancer, his dad died too      Hyperlipidemia    On Lipitor      Insomnia    Continue with zolpidem prn  Potential benefits of a long term benzodiazepines  use as well as potential risks  and complications were explained to the patient and were aknowledged.         Meds ordered this encounter  Medications   traMADol (ULTRAM) 50 MG tablet    Sig: Take 1 tablet (50 mg total) by mouth every 6 (six) hours as needed for severe pain.    Dispense:  120 tablet    Refill:  2   zolpidem (AMBIEN) 10 MG tablet    Sig: Take 1 tablet (10 mg total) by mouth at bedtime as needed for sleep.    Dispense:  90 tablet    Refill:  1      Follow-up: Return in about 4 months (around 02/19/2022) for a follow-up visit.  Sonda PrimesAlex Karandeep Resende, MD

## 2021-12-09 ENCOUNTER — Other Ambulatory Visit (INDEPENDENT_AMBULATORY_CARE_PROVIDER_SITE_OTHER): Payer: Managed Care, Other (non HMO)

## 2021-12-09 DIAGNOSIS — R635 Abnormal weight gain: Secondary | ICD-10-CM | POA: Diagnosis not present

## 2021-12-09 DIAGNOSIS — I10 Essential (primary) hypertension: Secondary | ICD-10-CM

## 2021-12-09 DIAGNOSIS — R739 Hyperglycemia, unspecified: Secondary | ICD-10-CM

## 2021-12-09 LAB — COMPREHENSIVE METABOLIC PANEL
ALT: 24 U/L (ref 0–53)
AST: 22 U/L (ref 0–37)
Albumin: 4.4 g/dL (ref 3.5–5.2)
Alkaline Phosphatase: 71 U/L (ref 39–117)
BUN: 18 mg/dL (ref 6–23)
CO2: 29 mEq/L (ref 19–32)
Calcium: 9.8 mg/dL (ref 8.4–10.5)
Chloride: 103 mEq/L (ref 96–112)
Creatinine, Ser: 1.26 mg/dL (ref 0.40–1.50)
GFR: 60.37 mL/min (ref >60.00)
Glucose, Bld: 94 mg/dL (ref 70–99)
Potassium: 4.4 mEq/L (ref 3.5–5.1)
Sodium: 139 mEq/L (ref 135–145)
Total Bilirubin: 0.8 mg/dL (ref 0.2–1.2)
Total Protein: 7 g/dL (ref 6.0–8.3)

## 2021-12-09 LAB — HEMOGLOBIN A1C: Hgb A1c MFr Bld: 6.4 % (ref 4.6–6.5)

## 2022-02-20 ENCOUNTER — Encounter: Payer: Self-pay | Admitting: Internal Medicine

## 2022-02-20 ENCOUNTER — Ambulatory Visit (INDEPENDENT_AMBULATORY_CARE_PROVIDER_SITE_OTHER): Payer: Managed Care, Other (non HMO) | Admitting: Internal Medicine

## 2022-02-20 VITALS — BP 120/74 | HR 64 | Temp 98.8°F | Ht 70.0 in | Wt 206.0 lb

## 2022-02-20 DIAGNOSIS — Z0000 Encounter for general adult medical examination without abnormal findings: Secondary | ICD-10-CM | POA: Diagnosis not present

## 2022-02-20 DIAGNOSIS — I2583 Coronary atherosclerosis due to lipid rich plaque: Secondary | ICD-10-CM

## 2022-02-20 DIAGNOSIS — R350 Frequency of micturition: Secondary | ICD-10-CM

## 2022-02-20 DIAGNOSIS — N401 Enlarged prostate with lower urinary tract symptoms: Secondary | ICD-10-CM | POA: Diagnosis not present

## 2022-02-20 DIAGNOSIS — I2510 Atherosclerotic heart disease of native coronary artery without angina pectoris: Secondary | ICD-10-CM | POA: Diagnosis not present

## 2022-02-20 DIAGNOSIS — M2550 Pain in unspecified joint: Secondary | ICD-10-CM

## 2022-02-20 DIAGNOSIS — I10 Essential (primary) hypertension: Secondary | ICD-10-CM | POA: Diagnosis not present

## 2022-02-20 MED ORDER — TADALAFIL 5 MG PO TABS: 5.0000 mg | ORAL_TABLET | Freq: Every day | ORAL | 3 refills | Status: DC

## 2022-02-20 MED ORDER — ZOLPIDEM TARTRATE 10 MG PO TABS: 10.0000 mg | ORAL_TABLET | Freq: Every evening | ORAL | 1 refills | Status: DC | PRN

## 2022-02-20 MED ORDER — TRAMADOL HCL 50 MG PO TABS: 50.0000 mg | ORAL_TABLET | Freq: Four times a day (QID) | ORAL | 2 refills | Status: DC | PRN

## 2022-02-20 NOTE — Assessment & Plan Note (Signed)
On Metoprolol Monitor BP

## 2022-02-20 NOTE — Progress Notes (Signed)
Subjective:  Patient ID: Jonathon Griffin, male    DOB: [birth year]  Age: 66 y.o. MRN: [id number]  CC: Follow-up (4 MONTH F/U)   HPI Gearldine Bienenstock presents for BPH, OA, insomnia  Outpatient Medications Prior to Visit  Medication Sig Dispense Refill   acetaminophen (TYLENOL) 500 MG tablet Take 500 mg by mouth every 6 (six) hours as needed for moderate pain.     aspirin EC 81 MG tablet Take 1 tablet (81 mg total) by mouth daily. 90 tablet 3   atorvastatin (LIPITOR) 80 MG tablet TAKE 1 TABLET BY MOUTH EVERY DAY AT 6PM 90 tablet 3   Cholecalciferol (VITAMIN D3) 50 MCG (2000 UT) capsule Take 1 capsule (2,000 Units total) by mouth daily. 100 capsule 3   famotidine (PEPCID) 20 MG tablet TAKE 1 TABLET BY MOUTH TWICE A DAY 180 tablet 2   lamoTRIgine (LAMICTAL) 100 MG tablet TAKE 1 TABLET BY MOUTH EVERY DAY 90 tablet 1   metoprolol succinate (TOPROL-XL) 25 MG 24 hr tablet TAKE 1 TABLET BY MOUTH EVERY DAY 90 tablet 2   tadalafil (CIALIS) 5 MG tablet Take 1 tablet (5 mg total) by mouth daily. 90 tablet 3   traMADol (ULTRAM) 50 MG tablet Take 1 tablet (50 mg total) by mouth every 6 (six) hours as needed for severe pain. 120 tablet 2   zolpidem (AMBIEN) 10 MG tablet Take 1 tablet (10 mg total) by mouth at bedtime as needed for sleep. 90 tablet 1   chlorhexidine (HIBICLENS) 4 % external liquid Apply topically daily as needed. (Patient not taking: Reported on 02/20/2022) 950 mL 0   No facility-administered medications prior to visit.    ROS: Review of Systems  Constitutional:  Negative for appetite change, fatigue and unexpected weight change.  HENT:  Negative for congestion, nosebleeds, sneezing, sore throat and trouble swallowing.   Eyes:  Negative for itching and visual disturbance.  Respiratory:  Negative for cough.   Cardiovascular:  Negative for chest pain, palpitations and leg swelling.  Gastrointestinal:  Negative for abdominal distention, blood in stool, diarrhea and nausea.   Genitourinary:  Positive for frequency and urgency. Negative for hematuria.  Musculoskeletal:  Positive for arthralgias and back pain. Negative for gait problem, joint swelling and neck pain.  Skin:  Negative for rash.  Neurological:  Negative for dizziness, tremors, speech difficulty and weakness.  Psychiatric/Behavioral:  Positive for sleep disturbance. Negative for agitation, dysphoric mood and suicidal ideas. The patient is nervous/anxious.     Objective:  BP 120/74 (BP Location: Left Arm)   Pulse 64   Temp 98.8 F (37.1 C) (Oral)   Ht 5\' 10"  (1.778 m)   Wt 206 lb (93.4 kg)   SpO2 99%   BMI 29.56 kg/m   BP Readings from Last 3 Encounters:  02/20/22 120/74  10/20/21 140/82  07/20/21 122/72    Wt Readings from Last 3 Encounters:  02/20/22 206 lb (93.4 kg)  10/20/21 208 lb (94.3 kg)  07/20/21 210 lb (95.3 kg)    Physical Exam Constitutional:      General: He is not in acute distress.    Appearance: Normal appearance. He is well-developed.     Comments: NAD  Eyes:     Conjunctiva/sclera: Conjunctivae normal.     Pupils: Pupils are equal, round, and reactive to light.  Neck:     Thyroid: No thyromegaly.     Vascular: No JVD.  Cardiovascular:     Rate and Rhythm: Normal rate and regular  rhythm.     Heart sounds: Normal heart sounds. No murmur heard.    No friction rub. No gallop.  Pulmonary:     Effort: Pulmonary effort is normal. No respiratory distress.     Breath sounds: Normal breath sounds. No wheezing or rales.  Chest:     Chest wall: No tenderness.  Abdominal:     General: Bowel sounds are normal. There is no distension.     Palpations: Abdomen is soft. There is no mass.     Tenderness: There is no abdominal tenderness. There is no guarding or rebound.  Musculoskeletal:        General: No tenderness. Normal range of motion.     Cervical back: Normal range of motion.  Lymphadenopathy:     Cervical: No cervical adenopathy.  Skin:    General: Skin is  warm and dry.     Findings: No rash.  Neurological:     Mental Status: He is alert and oriented to person, place, and time.     Cranial Nerves: No cranial nerve deficit.     Motor: No abnormal muscle tone.     Coordination: Coordination normal.     Gait: Gait normal.     Deep Tendon Reflexes: Reflexes are normal and symmetric.  Psychiatric:        Behavior: Behavior normal.        Thought Content: Thought content normal.        Judgment: Judgment normal.     Lab Results  Component Value Date   WBC 7.5 05/02/2021   HGB 13.0 05/02/2021   HCT 39.9 05/02/2021   PLT 189.0 05/02/2021   GLUCOSE 94 12/09/2021   CHOL 128 05/02/2021   TRIG 168.0 (H) 05/02/2021   HDL 43.60 05/02/2021   LDLDIRECT 51.0 03/24/2020   LDLCALC 51 05/02/2021   ALT 24 12/09/2021   AST 22 12/09/2021   NA 139 12/09/2021   K 4.4 12/09/2021   CL 103 12/09/2021   CREATININE 1.26 12/09/2021   BUN 18 12/09/2021   CO2 29 12/09/2021   TSH 1.97 05/02/2021   PSA 0.60 05/02/2021   INR 2.3 (H) 05/14/2019   HGBA1C 6.4 12/09/2021    No results found.  Assessment & Plan:   Problem List Items Addressed This Visit     Arthralgia    Chronic Continue with tramadol prn  Potential benefits of a long term opioids use as well as potential risks (i.e. addiction risk, apnea etc) and complications (i.e. Somnolence, constipation and others) were explained to the patient and were aknowledged.      BPH (benign prostatic hyperplasia)    Cont on Cialis 5 mg daily.      Relevant Orders   PSA   Coronary artery disease    On Lipitor, aspirin, metoprolol      Relevant Medications   tadalafil (CIALIS) 5 MG tablet   Other Relevant Orders   TSH   Urinalysis   Lipid panel   CBC with Differential/Platelet   PSA   Comprehensive metabolic panel   Hemoglobin A1c   Essential hypertension    On Metoprolol Monitor BP      Relevant Medications   tadalafil (CIALIS) 5 MG tablet   Other Relevant Orders   TSH    Urinalysis   Lipid panel   CBC with Differential/Platelet   PSA   Comprehensive metabolic panel   Hemoglobin A1c   Well adult exam - Primary   Relevant Orders   TSH  Urinalysis   Lipid panel   CBC with Differential/Platelet   PSA   Comprehensive metabolic panel   Hemoglobin A1c      Meds ordered this encounter  Medications   tadalafil (CIALIS) 5 MG tablet    Sig: Take 1 tablet (5 mg total) by mouth daily.    Dispense:  90 tablet    Refill:  3   zolpidem (AMBIEN) 10 MG tablet    Sig: Take 1 tablet (10 mg total) by mouth at bedtime as needed for sleep.    Dispense:  90 tablet    Refill:  1   traMADol (ULTRAM) 50 MG tablet    Sig: Take 1 tablet (50 mg total) by mouth every 6 (six) hours as needed for severe pain.    Dispense:  120 tablet    Refill:  2      Follow-up: Return in about 6 months (around 08/22/2022) for Wellness Exam.  Sonda Primes, MD

## 2022-02-20 NOTE — Assessment & Plan Note (Signed)
Chronic Continue with tramadol prn  Potential benefits of a long term opioids use as well as potential risks (i.e. addiction risk, apnea etc) and complications (i.e. Somnolence, constipation and others) were explained to the patient and were aknowledged.

## 2022-02-20 NOTE — Assessment & Plan Note (Signed)
On Lipitor, aspirin, metoprolol

## 2022-02-20 NOTE — Assessment & Plan Note (Signed)
Cont on Cialis 5 mg daily.

## 2022-04-03 ENCOUNTER — Telehealth: Payer: Self-pay | Admitting: Internal Medicine

## 2022-04-03 NOTE — Telephone Encounter (Signed)
Pt asking if it would be better to have a medicine sent to the pharmacy due to his history of heart attack in 2020. Pt hopes if he starts medication tonight he can go to work tomorrow. Pt says the cough is the worst symptom.  Call pt [phone]

## 2022-04-03 NOTE — Telephone Encounter (Signed)
Pt called to report cough and clear sputum. Nasal drainage.No chest congestion. The cough is his concern.  Pt want to know what he can take OTC that is safe with his history of having a stent in heart in 2020 from a stroke. Please advise pt [phone] Pt uses [facility] [address]

## 2022-04-04 MED ORDER — PROMETHAZINE-DM 6.25-15 MG/5ML PO SYRP: 5.0000 mL | ORAL_SOLUTION | Freq: Four times a day (QID) | ORAL | 0 refills | Status: DC | PRN

## 2022-04-04 NOTE — Telephone Encounter (Signed)
Notified pt rx MD sent rx to [facility]Marland KitchenRaechel Griffin

## 2022-04-04 NOTE — Telephone Encounter (Signed)
I will send a cough syrup prescription in. Office visit if not better Thanks

## 2022-04-06 ENCOUNTER — Other Ambulatory Visit: Payer: Self-pay | Admitting: Internal Medicine

## 2022-04-14 ENCOUNTER — Other Ambulatory Visit: Payer: Self-pay | Admitting: Cardiovascular Disease

## 2022-05-13 ENCOUNTER — Other Ambulatory Visit: Payer: Self-pay | Admitting: Cardiovascular Disease

## 2022-07-17 NOTE — Progress Notes (Unsigned)
Cardiology Office Note:   Date:  07/18/2022  NAME:  Jonathon Griffin    MRN: [id number] DOB:  November 28, 1955   PCP:  Cassandria Anger, MD  Cardiologist:  Evalina Field, MD  Electrophysiologist:  None   Referring MD: Cassandria Anger, MD   Chief Complaint  Patient presents with   Follow-up   History of Present Illness:   Jonathon Griffin is a 67 y.o. male with a hx of CAD, HTN, HLD who presents for follow-up.  He reports he is doing well.  Denies chest pain or trouble breathing.  LDL cholesterol last year was at goal.  No issues.  Still working in Lobbyist.  Working on his diet.  BP is stable.  Denies any major symptoms in office.  Overall doing quite well.  Problem List 1. Cardiac Arrest 04/06/2019 2/2 inferior STEMI ([place]) -s/p PCI to RCA -course c/b aspiration PNA, acute liver injury, resp failure requiring trach, protein deficiency requiring PEG 2. Multiple strokes (subcortical R occipital lobe/R cerebellar) -in setting of cardiac arrest  -negative monitor for Afib 08/25/2019 3. Hyperlipidemia  -T chol 128, HDL 43, LDL 51, TG 168  Past Medical History: Past Medical History:  Diagnosis Date   Acute on chronic respiratory failure with hypoxia (HCC)    Acute ST elevation myocardial infarction (STEMI) (HCC)    Anxiety    Arthritis    Aspiration pneumonia due to gastric secretions (HCC)    Cardiac arrest (HCC)    Coronary artery disease    Depression    Embolic stroke involving anterior cerebral artery (HCC)    GERD (gastroesophageal reflux disease)    Hyperlipidemia    Hypertension    Obstructive sleep apnea    Sleep apnea     Past Surgical History: Past Surgical History:  Procedure Laterality Date   bicep surgery     CARDIAC CATHETERIZATION     EVALUATION UNDER ANESTHESIA WITH HEMORRHOIDECTOMY N/A 04/06/2021   Procedure: LIFT REPAIR OF Intersphenteric perirectal FISTULA. HEMORRHOID ligation. ANORECTAL EXAMINATION UNDER ANESTHESIA;  Surgeon: Michael Boston, MD;  Location: [facility];  Service: General;  Laterality: N/A;  GEN w/ERAS PATHWAY LOCAL   PEG PLACEMENT     TRACHEOSTOMY      Current Medications: Current Meds  Medication Sig   acetaminophen (TYLENOL) 500 MG tablet Take 500 mg by mouth every 6 (six) hours as needed for moderate pain.   aspirin EC 81 MG tablet Take 1 tablet (81 mg total) by mouth daily.   atorvastatin (LIPITOR) 80 MG tablet TAKE 1 TABLET BY MOUTH EVERY DAY AT 6PM   Cholecalciferol (VITAMIN D3) 50 MCG (2000 UT) capsule Take 1 capsule (2,000 Units total) by mouth daily.   famotidine (PEPCID) 20 MG tablet TAKE 1 TABLET BY MOUTH TWICE A DAY   lamoTRIgine (LAMICTAL) 100 MG tablet TAKE 1 TABLET BY MOUTH EVERY DAY   metoprolol succinate (TOPROL-XL) 25 MG 24 hr tablet TAKE 1 TABLET BY MOUTH EVERY DAY   tadalafil (CIALIS) 5 MG tablet Take 1 tablet (5 mg total) by mouth daily.   traMADol (ULTRAM) 50 MG tablet Take 1 tablet (50 mg total) by mouth every 6 (six) hours as needed for severe pain.   zolpidem (AMBIEN) 10 MG tablet Take 1 tablet (10 mg total) by mouth at bedtime as needed for sleep.     Allergies:    Nsaids, Oxycodone, and Wellbutrin [bupropion]   Social History: Social History   Socioeconomic History   Marital status: Married  Spouse name: Not on file   Number of children: Not on file   Years of education: Not on file   Highest education level: Not on file  Occupational History   Occupation: welder  Tobacco Use   Smoking status: Never   Smokeless tobacco: Never  Vaping Use   Vaping Use: Never used  Substance and Sexual Activity   Alcohol use: No   Drug use: No   Sexual activity: Yes  Other Topics Concern   Not on file  Social History Narrative   Not on file   Social Determinants of Health   Financial Resource Strain: Not on file  Food Insecurity: Not on file  Transportation Needs: Not on file  Physical Activity: Not on file  Stress: Not on file  Social Connections: Not on file      Family History: The patient's family history includes Arthritis in his mother and sister.  ROS:   All other ROS reviewed and negative. Pertinent positives noted in the HPI.     EKGs/Labs/Other Studies Reviewed:   The following studies were personally reviewed by me today:  Recent Labs: 12/09/2021: ALT 24; BUN 18; Creatinine, Ser 1.26; Potassium 4.4; Sodium 139   Recent Lipid Panel    Component Value Date/Time   CHOL 128 05/02/2021 0831   CHOL 119 06/09/2019 1021   TRIG 168.0 (H) 05/02/2021 0831   HDL 43.60 05/02/2021 0831   HDL 39 (L) 06/09/2019 1021   CHOLHDL 3 05/02/2021 0831   VLDL 33.6 05/02/2021 0831   LDLCALC 51 05/02/2021 0831   LDLCALC 56 06/09/2019 1021   LDLDIRECT 51.0 03/24/2020 1641    Physical Exam:   VS:  BP 116/70 (BP Location: Left Arm, Patient Position: Sitting, Cuff Size: Normal)   Pulse (!) 58   Ht '5\' 10"'$  (1.778 m)   Wt 207 lb (93.9 kg)   BMI 29.70 kg/m    Wt Readings from Last 3 Encounters:  07/18/22 207 lb (93.9 kg)  02/20/22 206 lb (93.4 kg)  10/20/21 208 lb (94.3 kg)    General: Well nourished, well developed, in no acute distress Head: Atraumatic, normal size  Eyes: PEERLA, EOMI  Neck: Supple, no JVD Endocrine: No thryomegaly Cardiac: Normal S1, S2; RRR; no murmurs, rubs, or gallops Lungs: Clear to auscultation bilaterally, no wheezing, rhonchi or rales  Abd: Soft, nontender, no hepatomegaly  Ext: No edema, pulses 2+ Musculoskeletal: No deformities, BUE and BLE strength normal and equal Skin: Warm and dry, no rashes   Neuro: Alert and oriented to person, place, time, and situation, CNII-XII grossly intact, no focal deficits  Psych: Normal mood and affect   ASSESSMENT:   Jonathon Griffin is a 67 y.o. male who presents for the following: 1. Coronary artery disease involving native coronary artery of native heart without angina pectoris   2. Mixed hyperlipidemia   3. Essential hypertension     PLAN:   1. Coronary artery disease  involving native coronary artery of native heart without angina pectoris 2. Mixed hyperlipidemia 3. Essential hypertension -Out-of-hospital cardiac arrest due to inferior STEMI at [facility] in [place] in November 2020.  Status post PCI to the RCA.  Course was complicated by extended stay in long-term care.  Had aspiration pneumonia, liver injury, tracheostomy and PEG tube placement.  Also had stroke which was related to cardiac arrest. -He has recovered well.  He is doing quite well.  His EF is 55%.  All of his risk factors are well-controlled.  He will continue aspirin 81 mg daily.  He will continue Lipitor 80 mg daily.  His blood pressure is quite stable.  We discussed proper exercise and improving his diet.  He overall seems to be doing well and all of his risk factors are well-controlled.  He will see [place] yearly.  Disposition: Return in about 1 year (around 07/19/2023).  Medication Adjustments/Labs and Tests Ordered: Current medicines are reviewed at length with the patient today.  Concerns regarding medicines are outlined above.  No orders of the defined types were placed in this encounter.  No orders of the defined types were placed in this encounter.   Patient Instructions  Medication Instructions:  The current medical regimen is effective;  continue present plan and medications.  *If you need a refill on your cardiac medications before your next appointment, please call your pharmacy*   Follow-Up: At [facility], you and your health needs are our priority.  As part of our continuing mission to provide you with exceptional heart care, we have created designated Provider Care Teams.  These Care Teams include your primary Cardiologist (physician) and Advanced Practice Providers (APPs -  Physician Assistants and Nurse Practitioners) who all work together to provide you with the care you need, when you need it.  We recommend signing up for the patient  portal called "MyChart".  Sign up information is provided on this After Visit Summary.  MyChart is used to connect with patients for Virtual Visits (Telemedicine).  Patients are able to view lab/test results, encounter notes, upcoming appointments, etc.  Non-urgent messages can be sent to your provider as well.   To learn more about what you can do with MyChart, go to [email or web address].    Your next appointment:   12 month(s)  Provider:   Almyra Deforest, PA-C or Sande Rives, PA-C, or Diona Browner, NP       Time Spent with Patient: I have spent a total of 25 minutes with patient reviewing hospital notes, telemetry, EKGs, labs and examining the patient as well as establishing an assessment and plan that was discussed with the patient.  > 50% of time was spent in direct patient care.  Signed, Addison Naegeli. Audie Box, MD, [place]  [address] [place], [place] [address] [phone]  07/18/2022 3:55 PM

## 2022-07-18 ENCOUNTER — Encounter: Payer: Self-pay | Admitting: Cardiovascular Disease

## 2022-07-18 ENCOUNTER — Ambulatory Visit: Payer: Managed Care, Other (non HMO) | Attending: Cardiovascular Disease | Admitting: Cardiovascular Disease

## 2022-07-18 VITALS — BP 116/70 | HR 58 | Ht 70.0 in | Wt 207.0 lb

## 2022-07-18 DIAGNOSIS — E782 Mixed hyperlipidemia: Secondary | ICD-10-CM

## 2022-07-18 DIAGNOSIS — I10 Essential (primary) hypertension: Secondary | ICD-10-CM

## 2022-07-18 DIAGNOSIS — I2510 Atherosclerotic heart disease of native coronary artery without angina pectoris: Secondary | ICD-10-CM | POA: Diagnosis not present

## 2022-07-18 NOTE — Patient Instructions (Signed)
Medication Instructions:  The current medical regimen is effective;  continue present plan and medications.  *If you need a refill on your cardiac medications before your next appointment, please call your pharmacy*   Follow-Up: At [facility], you and your health needs are our priority.  As part of our continuing mission to provide you with exceptional heart care, we have created designated Provider Care Teams.  These Care Teams include your primary Cardiologist (physician) and Advanced Practice Providers (APPs -  Physician Assistants and Nurse Practitioners) who all work together to provide you with the care you need, when you need it.  We recommend signing up for the patient portal called "MyChart".  Sign up information is provided on this After Visit Summary.  MyChart is used to connect with patients for Virtual Visits (Telemedicine).  Patients are able to view lab/test results, encounter notes, upcoming appointments, etc.  Non-urgent messages can be sent to your provider as well.   To learn more about what you can do with MyChart, go to [email or web address].    Your next appointment:   12 month(s)  Provider:   Almyra Deforest, PA-C or Sande Rives, PA-C, or Diona Browner, NP

## 2022-08-01 ENCOUNTER — Ambulatory Visit: Payer: Managed Care, Other (non HMO) | Admitting: Cardiovascular Disease

## 2022-08-10 ENCOUNTER — Other Ambulatory Visit: Payer: Self-pay | Admitting: Cardiovascular Disease

## 2022-08-23 ENCOUNTER — Ambulatory Visit: Payer: Managed Care, Other (non HMO) | Admitting: Internal Medicine

## 2022-08-23 ENCOUNTER — Encounter: Payer: Self-pay | Admitting: Internal Medicine

## 2022-08-23 VITALS — BP 102/70 | HR 70 | Temp 99.0°F | Ht 70.0 in | Wt 207.0 lb

## 2022-08-23 DIAGNOSIS — Z0000 Encounter for general adult medical examination without abnormal findings: Secondary | ICD-10-CM

## 2022-08-23 DIAGNOSIS — I2510 Atherosclerotic heart disease of native coronary artery without angina pectoris: Secondary | ICD-10-CM

## 2022-08-23 DIAGNOSIS — R635 Abnormal weight gain: Secondary | ICD-10-CM

## 2022-08-23 DIAGNOSIS — E785 Hyperlipidemia, unspecified: Secondary | ICD-10-CM

## 2022-08-23 DIAGNOSIS — I10 Essential (primary) hypertension: Secondary | ICD-10-CM | POA: Diagnosis not present

## 2022-08-23 DIAGNOSIS — N401 Enlarged prostate with lower urinary tract symptoms: Secondary | ICD-10-CM | POA: Diagnosis not present

## 2022-08-23 DIAGNOSIS — R350 Frequency of micturition: Secondary | ICD-10-CM

## 2022-08-23 DIAGNOSIS — F413 Other mixed anxiety disorders: Secondary | ICD-10-CM

## 2022-08-23 DIAGNOSIS — I2583 Coronary atherosclerosis due to lipid rich plaque: Secondary | ICD-10-CM

## 2022-08-23 MED ORDER — TRAMADOL HCL 50 MG PO TABS: 50.0000 mg | ORAL_TABLET | Freq: Four times a day (QID) | ORAL | 2 refills | Status: DC | PRN

## 2022-08-23 MED ORDER — B COMPLEX PLUS PO TABS: 1.0000 | ORAL_TABLET | Freq: Every day | ORAL | 3 refills | Status: DC

## 2022-08-23 MED ORDER — TADALAFIL 5 MG PO TABS: 5.0000 mg | ORAL_TABLET | Freq: Every day | ORAL | 3 refills | Status: AC

## 2022-08-23 MED ORDER — ZOLPIDEM TARTRATE 10 MG PO TABS: 10.0000 mg | ORAL_TABLET | Freq: Every evening | ORAL | 1 refills | Status: DC | PRN

## 2022-08-23 NOTE — Assessment & Plan Note (Signed)
Wt Readings from Last 3 Encounters:  08/23/22 207 lb (93.9 kg)  07/18/22 207 lb (93.9 kg)  02/20/22 206 lb (93.4 kg)

## 2022-08-23 NOTE — Progress Notes (Signed)
Subjective:  Patient ID: Jonathon Griffin, male    DOB: 1955/12/27  Age: 67 y.o. MRN: [id number]  CC: Annual Exam (Pt would like refill for Tramadol and Tadalafil in paper form.)   HPI Gearldine Bienenstock presents for OA, CAD, dyslipidemia  Outpatient Medications Prior to Visit  Medication Sig Dispense Refill   acetaminophen (TYLENOL) 500 MG tablet Take 500 mg by mouth every 6 (six) hours as needed for moderate pain.     aspirin EC 81 MG tablet Take 1 tablet (81 mg total) by mouth daily. 90 tablet 3   atorvastatin (LIPITOR) 80 MG tablet TAKE 1 TABLET BY MOUTH EVERY DAY AT 6PM 90 tablet 3   Cholecalciferol (VITAMIN D3) 50 MCG (2000 UT) capsule Take 1 capsule (2,000 Units total) by mouth daily. 100 capsule 3   famotidine (PEPCID) 20 MG tablet TAKE 1 TABLET BY MOUTH TWICE A DAY 180 tablet 2   lamoTRIgine (LAMICTAL) 100 MG tablet TAKE 1 TABLET BY MOUTH EVERY DAY 90 tablet 1   metoprolol succinate (TOPROL-XL) 25 MG 24 hr tablet TAKE 1 TABLET BY MOUTH EVERY DAY 90 tablet 1   tadalafil (CIALIS) 5 MG tablet Take 1 tablet (5 mg total) by mouth daily. 90 tablet 3   traMADol (ULTRAM) 50 MG tablet Take 1 tablet (50 mg total) by mouth every 6 (six) hours as needed for severe pain. 120 tablet 2   zolpidem (AMBIEN) 10 MG tablet Take 1 tablet (10 mg total) by mouth at bedtime as needed for sleep. 90 tablet 1   No facility-administered medications prior to visit.    ROS: Review of Systems  Constitutional:  Negative for appetite change, fatigue and unexpected weight change.  HENT:  Negative for congestion, nosebleeds, sneezing, sore throat and trouble swallowing.   Eyes:  Negative for itching and visual disturbance.  Respiratory:  Negative for cough.   Cardiovascular:  Negative for chest pain, palpitations and leg swelling.  Gastrointestinal:  Negative for abdominal distention, blood in stool, diarrhea and nausea.  Genitourinary:  Negative for frequency and hematuria.  Musculoskeletal:  Positive for  arthralgias and gait problem. Negative for back pain, joint swelling and neck pain.  Skin:  Negative for rash.  Neurological:  Negative for dizziness, tremors, speech difficulty and weakness.  Psychiatric/Behavioral:  Negative for agitation, dysphoric mood, sleep disturbance and suicidal ideas. The patient is not nervous/anxious.     Objective:  BP 102/70 (BP Location: Left Arm, Patient Position: Sitting, Cuff Size: Normal)   Pulse 70   Temp 99 F (37.2 C) (Oral)   Ht 5\' 10"  (1.778 m)   Wt 207 lb (93.9 kg)   SpO2 92%   BMI 29.70 kg/m   BP Readings from Last 3 Encounters:  08/23/22 102/70  07/18/22 116/70  02/20/22 120/74    Wt Readings from Last 3 Encounters:  08/23/22 207 lb (93.9 kg)  07/18/22 207 lb (93.9 kg)  02/20/22 206 lb (93.4 kg)    Physical Exam Constitutional:      General: He is not in acute distress.    Appearance: He is well-developed. He is obese.     Comments: NAD  Eyes:     Conjunctiva/sclera: Conjunctivae normal.     Pupils: Pupils are equal, round, and reactive to light.  Neck:     Thyroid: No thyromegaly.     Vascular: No JVD.  Cardiovascular:     Rate and Rhythm: Normal rate and regular rhythm.     Heart sounds: Normal heart sounds.  No murmur heard.    No friction rub. No gallop.  Pulmonary:     Effort: Pulmonary effort is normal. No respiratory distress.     Breath sounds: Normal breath sounds. No wheezing or rales.  Chest:     Chest wall: No tenderness.  Abdominal:     General: Bowel sounds are normal. There is no distension.     Palpations: Abdomen is soft. There is no mass.     Tenderness: There is no abdominal tenderness. There is no guarding or rebound.  Musculoskeletal:        General: No tenderness. Normal range of motion.     Cervical back: Normal range of motion.  Lymphadenopathy:     Cervical: No cervical adenopathy.  Skin:    General: Skin is warm and dry.     Findings: No rash.  Neurological:     Mental Status: He is  alert and oriented to person, place, and time.     Cranial Nerves: No cranial nerve deficit.     Motor: No abnormal muscle tone.     Coordination: Coordination normal.     Gait: Gait normal.     Deep Tendon Reflexes: Reflexes are normal and symmetric.  Psychiatric:        Behavior: Behavior normal.        Thought Content: Thought content normal.        Judgment: Judgment normal.     Lab Results  Component Value Date   WBC 7.5 05/02/2021   HGB 13.0 05/02/2021   HCT 39.9 05/02/2021   PLT 189.0 05/02/2021   GLUCOSE 94 12/09/2021   CHOL 128 05/02/2021   TRIG 168.0 (H) 05/02/2021   HDL 43.60 05/02/2021   LDLDIRECT 51.0 03/24/2020   LDLCALC 51 05/02/2021   ALT 24 12/09/2021   AST 22 12/09/2021   NA 139 12/09/2021   K 4.4 12/09/2021   CL 103 12/09/2021   CREATININE 1.26 12/09/2021   BUN 18 12/09/2021   CO2 29 12/09/2021   TSH 1.97 05/02/2021   PSA 0.60 05/02/2021   INR 2.3 (H) 05/14/2019   HGBA1C 6.4 12/09/2021    No results found.  Assessment & Plan:   Problem List Items Addressed This Visit       Cardiovascular and Mediastinum   Coronary artery disease    On Lipitor, aspirin, metoprolol  Brilinta d/c      Relevant Medications   tadalafil (CIALIS) 5 MG tablet   Essential hypertension - Primary    Losartan - not taking On Metoprolol Monitor BP      Relevant Medications   tadalafil (CIALIS) 5 MG tablet     Other   Anxiety disorder    Doing well      Hyperlipidemia    On Lipitor      Relevant Medications   tadalafil (CIALIS) 5 MG tablet   Weight gain    Wt Readings from Last 3 Encounters:  08/23/22 207 lb (93.9 kg)  07/18/22 207 lb (93.9 kg)  02/20/22 206 lb (93.4 kg)           Meds ordered this encounter  Medications   tadalafil (CIALIS) 5 MG tablet    Sig: Take 1 tablet (5 mg total) by mouth daily.    Dispense:  90 tablet    Refill:  3   traMADol (ULTRAM) 50 MG tablet    Sig: Take 1 tablet (50 mg total) by mouth every 6 (six) hours  as needed for severe pain.  Dispense:  120 tablet    Refill:  2   zolpidem (AMBIEN) 10 MG tablet    Sig: Take 1 tablet (10 mg total) by mouth at bedtime as needed for sleep.    Dispense:  90 tablet    Refill:  1   B Complex-Folic Acid (B COMPLEX PLUS) TABS    Sig: Take 1 tablet by mouth daily.    Dispense:  100 tablet    Refill:  3      Follow-up: Return in about 3 months (around 11/22/2022) for Wellness Exam.  Walker Kehr, MD

## 2022-08-23 NOTE — Assessment & Plan Note (Signed)
Losartan - not taking On Metoprolol Monitor BP

## 2022-08-23 NOTE — Assessment & Plan Note (Signed)
On Lipitor, aspirin, metoprolol  Brilinta d/c

## 2022-08-23 NOTE — Assessment & Plan Note (Signed)
Doing well 

## 2022-08-23 NOTE — Assessment & Plan Note (Signed)
On Lipitor 

## 2022-08-28 LAB — COMPREHENSIVE METABOLIC PANEL
ALT: 25 U/L (ref 0–53)
AST: 20 U/L (ref 0–37)
Albumin: 4.2 g/dL (ref 3.5–5.2)
Alkaline Phosphatase: 69 U/L (ref 39–117)
BUN: 16 mg/dL (ref 6–23)
CO2: 28 mEq/L (ref 19–32)
Calcium: 9.3 mg/dL (ref 8.4–10.5)
Chloride: 105 mEq/L (ref 96–112)
Creatinine, Ser: 1.29 mg/dL (ref 0.40–1.50)
GFR: 58.4 mL/min — ABNORMAL LOW (ref >60.00)
Glucose, Bld: 105 mg/dL — ABNORMAL HIGH (ref 70–99)
Potassium: 4.3 mEq/L (ref 3.5–5.1)
Sodium: 140 mEq/L (ref 135–145)
Total Bilirubin: 0.5 mg/dL (ref 0.2–1.2)
Total Protein: 6.4 g/dL (ref 6.0–8.3)

## 2022-08-28 LAB — CBC WITH DIFFERENTIAL/PLATELET
Basophils Absolute: 0 10*3/uL (ref 0.0–0.1)
Basophils Relative: 0.6 % (ref 0.0–3.0)
Eosinophils Absolute: 0.2 10*3/uL (ref 0.0–0.7)
Eosinophils Relative: 2.7 % (ref 0.0–5.0)
HCT: 41.8 % (ref 39.0–52.0)
Hemoglobin: 14.1 g/dL (ref 13.0–17.0)
Lymphocytes Relative: 30.3 % (ref 12.0–46.0)
Lymphs Abs: 1.9 10*3/uL (ref 0.7–4.0)
MCHC: 33.6 g/dL (ref 30.0–36.0)
MCV: 90.6 fl (ref 78.0–100.0)
Monocytes Absolute: 0.5 10*3/uL (ref 0.1–1.0)
Monocytes Relative: 8.4 % (ref 3.0–12.0)
Neutro Abs: 3.7 10*3/uL (ref 1.4–7.7)
Neutrophils Relative %: 58 % (ref 43.0–77.0)
Platelets: 189 10*3/uL (ref 150.0–400.0)
RBC: 4.62 Mil/uL (ref 4.22–5.81)
RDW: 14.1 % (ref 11.5–15.5)
WBC: 6.4 10*3/uL (ref 4.0–10.5)

## 2022-08-28 LAB — LIPID PANEL
Cholesterol: 116 mg/dL (ref 0–200)
HDL: 37 mg/dL — ABNORMAL LOW (ref >39.00)
LDL Cholesterol: 44 mg/dL (ref 0–99)
NonHDL: 78.69
Total CHOL/HDL Ratio: 3
Triglycerides: 175 mg/dL — ABNORMAL HIGH (ref 0.0–149.0)
VLDL: 35 mg/dL (ref 0.0–40.0)

## 2022-08-28 LAB — PSA: PSA: 0.53 ng/mL (ref 0.10–4.00)

## 2022-08-28 LAB — HEMOGLOBIN A1C: Hgb A1c MFr Bld: 6.3 % (ref 4.6–6.5)

## 2022-08-28 LAB — TSH: TSH: 2.52 u[IU]/mL (ref 0.35–5.50)

## 2022-08-29 LAB — URINALYSIS
Bilirubin Urine: NEGATIVE
Hgb urine dipstick: NEGATIVE
Ketones, ur: NEGATIVE
Leukocytes,Ua: NEGATIVE
Nitrite: NEGATIVE
Specific Gravity, Urine: 1.02 (ref 1.000–1.030)
Total Protein, Urine: NEGATIVE
Urine Glucose: NEGATIVE
Urobilinogen, UA: 0.2 (ref 0.0–1.0)
pH: 5.5 (ref 5.0–8.0)

## 2022-10-06 ENCOUNTER — Other Ambulatory Visit: Payer: Self-pay | Admitting: Internal Medicine

## 2022-10-13 ENCOUNTER — Other Ambulatory Visit: Payer: Self-pay | Admitting: Cardiovascular Disease

## 2022-11-20 ENCOUNTER — Other Ambulatory Visit: Payer: Self-pay | Admitting: Physician Assistant

## 2022-11-20 DIAGNOSIS — M5416 Radiculopathy, lumbar region: Secondary | ICD-10-CM

## 2022-11-21 ENCOUNTER — Ambulatory Visit
Admission: RE | Admit: 2022-11-21 | Discharge: 2022-11-21 | Disposition: A | Discharge status: Home / Self Care | Payer: Managed Care, Other (non HMO) | Source: Ambulatory Visit | Attending: Physician Assistant | Admitting: Physician Assistant

## 2022-11-21 DIAGNOSIS — M5416 Radiculopathy, lumbar region: Secondary | ICD-10-CM

## 2022-11-22 ENCOUNTER — Ambulatory Visit: Payer: Managed Care, Other (non HMO) | Admitting: Internal Medicine

## 2022-12-06 ENCOUNTER — Ambulatory Visit: Payer: Managed Care, Other (non HMO) | Admitting: Internal Medicine

## 2022-12-21 ENCOUNTER — Telehealth: Payer: Self-pay

## 2022-12-21 ENCOUNTER — Encounter: Payer: Self-pay | Admitting: Internal Medicine

## 2022-12-21 ENCOUNTER — Ambulatory Visit: Payer: Managed Care, Other (non HMO) | Admitting: Internal Medicine

## 2022-12-21 VITALS — BP 110/70 | HR 61 | Temp 98.3°F | Ht 70.0 in | Wt 207.0 lb

## 2022-12-21 DIAGNOSIS — M5442 Lumbago with sciatica, left side: Secondary | ICD-10-CM

## 2022-12-21 DIAGNOSIS — I10 Essential (primary) hypertension: Secondary | ICD-10-CM

## 2022-12-21 DIAGNOSIS — M5431 Sciatica, right side: Secondary | ICD-10-CM | POA: Diagnosis not present

## 2022-12-21 DIAGNOSIS — M545 Low back pain, unspecified: Secondary | ICD-10-CM | POA: Insufficient documentation

## 2022-12-21 DIAGNOSIS — M5430 Sciatica, unspecified side: Secondary | ICD-10-CM | POA: Insufficient documentation

## 2022-12-21 MED ORDER — HYDROCODONE-ACETAMINOPHEN 5-325 MG PO TABS: 1.0000 | ORAL_TABLET | Freq: Four times a day (QID) | ORAL | 0 refills | Status: DC | PRN

## 2022-12-21 NOTE — Assessment & Plan Note (Signed)
Severe LBP x weeks - LLE pain MRI w/severe L3-4, L4-5 nerve encroachment. Seeing [facility], pt had a steroid shot B. He was treated w/steroids, Norco.  Tramadol is not working for his LBP. Pain is 8/10. It was 10/10....  D/c Tramadol Start Norco instead until he gets to the Pain clinic via  [facility]

## 2022-12-21 NOTE — Telephone Encounter (Signed)
Pts pharmacy has stated [facility] has notified him that it is a Sport and exercise psychologist on the   HYDROcodone-acetaminophen (NORCO/VICODIN) 5-325 MG tablet   and that they need you to send in a new rx for the 10-325 as they do have this dose in stock. Pts pharmacy is [facility] on [address] which is on file for the pt.

## 2022-12-21 NOTE — Progress Notes (Signed)
Subjective:  Patient ID: Jonathon Griffin, male    DOB: [birth year]  Age: 67 y.o. MRN: [id number]  CC: Follow-up (3 mnth f/u)   HPI Jonathon Griffin presents for severe LBP x weeks - LLE pain MRI w/severe L3-4, L4-5 nerve encroachment. Seeing [facility], pt had a steroid shot B. He was treated w/steroids, Norco.  Tramadol is not working for his LBP. Pain is 8/10. It was 10/10....   Outpatient Medications Prior to Visit  Medication Sig Dispense Refill   acetaminophen (TYLENOL) 500 MG tablet Take 500 mg by mouth every 6 (six) hours as needed for moderate pain.     aspirin EC 81 MG tablet Take 1 tablet (81 mg total) by mouth daily. 90 tablet 3   atorvastatin (LIPITOR) 80 MG tablet TAKE 1 TABLET BY MOUTH EVERY DAY AT 6PM 90 tablet 3   B Complex-Folic Acid (B COMPLEX PLUS) TABS Take 1 tablet by mouth daily. 100 tablet 3   Cholecalciferol (VITAMIN D3) 50 MCG (2000 UT) capsule Take 1 capsule (2,000 Units total) by mouth daily. 100 capsule 3   famotidine (PEPCID) 20 MG tablet TAKE 1 TABLET BY MOUTH TWICE A DAY 180 tablet 2   lamoTRIgine (LAMICTAL) 100 MG tablet TAKE 1 TABLET BY MOUTH EVERY DAY 90 tablet 1   metoprolol succinate (TOPROL-XL) 25 MG 24 hr tablet TAKE 1 TABLET BY MOUTH EVERY DAY 90 tablet 3   tadalafil (CIALIS) 5 MG tablet Take 1 tablet (5 mg total) by mouth daily. 90 tablet 3   zolpidem (AMBIEN) 10 MG tablet Take 1 tablet (10 mg total) by mouth at bedtime as needed for sleep. 90 tablet 1   traMADol (ULTRAM) 50 MG tablet Take 1 tablet (50 mg total) by mouth every 6 (six) hours as needed for severe pain. 120 tablet 2   No facility-administered medications prior to visit.    ROS: Review of Systems  Constitutional:  Negative for appetite change, fatigue and unexpected weight change.  HENT:  Negative for congestion, nosebleeds, sneezing, sore throat and trouble swallowing.   Eyes:  Negative for itching and visual disturbance.  Respiratory:  Negative for cough.    Cardiovascular:  Negative for chest pain, palpitations and leg swelling.  Gastrointestinal:  Negative for abdominal distention, blood in stool, diarrhea and nausea.  Genitourinary:  Negative for frequency and hematuria.  Musculoskeletal:  Positive for back pain and gait problem. Negative for joint swelling and neck pain.  Skin:  Negative for rash.  Neurological:  Negative for dizziness, tremors, speech difficulty and weakness.  Psychiatric/Behavioral:  Negative for agitation, dysphoric mood and sleep disturbance. The patient is not nervous/anxious.     Objective:  BP 110/70 (BP Location: Left Arm, Patient Position: Sitting, Cuff Size: Large)   Pulse 61   Temp 98.3 F (36.8 C) (Oral)   Ht 5\' 10"  (1.778 m)   Wt 207 lb (93.9 kg)   SpO2 96%   BMI 29.70 kg/m   BP Readings from Last 3 Encounters:  12/21/22 110/70  08/23/22 102/70  07/18/22 116/70    Wt Readings from Last 3 Encounters:  12/21/22 207 lb (93.9 kg)  08/23/22 207 lb (93.9 kg)  07/18/22 207 lb (93.9 kg)    Physical Exam Constitutional:      General: He is not in acute distress.    Appearance: He is well-developed. He is obese.     Comments: NAD  Eyes:     Conjunctiva/sclera: Conjunctivae normal.     Pupils:  Pupils are equal, round, and reactive to light.  Neck:     Thyroid: No thyromegaly.     Vascular: No JVD.  Cardiovascular:     Rate and Rhythm: Normal rate and regular rhythm.     Heart sounds: Normal heart sounds. No murmur heard.    No friction rub. No gallop.  Pulmonary:     Effort: Pulmonary effort is normal. No respiratory distress.     Breath sounds: Normal breath sounds. No wheezing or rales.  Chest:     Chest wall: No tenderness.  Abdominal:     General: Bowel sounds are normal. There is no distension.     Palpations: Abdomen is soft. There is no mass.     Tenderness: There is no abdominal tenderness. There is no guarding or rebound.  Musculoskeletal:        General: Tenderness present.  Normal range of motion.     Cervical back: Normal range of motion.     Right lower leg: No edema.     Left lower leg: No edema.  Lymphadenopathy:     Cervical: No cervical adenopathy.  Skin:    General: Skin is warm and dry.     Findings: No rash.  Neurological:     Mental Status: He is alert and oriented to person, place, and time.     Cranial Nerves: No cranial nerve deficit.     Motor: No abnormal muscle tone.     Coordination: Coordination normal.     Gait: Gait abnormal.     Deep Tendon Reflexes: Reflexes are normal and symmetric.  Psychiatric:        Behavior: Behavior normal.        Thought Content: Thought content normal.        Judgment: Judgment normal.   Antalgic gait LS is stiff  Lab Results  Component Value Date   WBC 6.4 08/28/2022   HGB 14.1 08/28/2022   HCT 41.8 08/28/2022   PLT 189.0 08/28/2022   GLUCOSE 105 (H) 08/28/2022   CHOL 116 08/28/2022   TRIG 175.0 (H) 08/28/2022   HDL 37.00 (L) 08/28/2022   LDLDIRECT 51.0 03/24/2020   LDLCALC 44 08/28/2022   ALT 25 08/28/2022   AST 20 08/28/2022   NA 140 08/28/2022   K 4.3 08/28/2022   CL 105 08/28/2022   CREATININE 1.29 08/28/2022   BUN 16 08/28/2022   CO2 28 08/28/2022   TSH 2.52 08/28/2022   PSA 0.53 08/28/2022   INR 2.3 (H) 05/14/2019   HGBA1C 6.3 08/28/2022    MR LUMBAR SPINE WO CONTRAST  Result Date: 11/28/2022 CLINICAL DATA:  Low back pain extending into the right lower extremity for 1 month. Prior fusion 2011. EXAM: MRI LUMBAR SPINE WITHOUT CONTRAST TECHNIQUE: Multiplanar, multisequence MR imaging of the lumbar spine was performed. No intravenous contrast was administered. COMPARISON:  Radiographs 02/14/2018 FINDINGS: Segmentation: The lowest lumbar type non-rib-bearing vertebra is labeled as L5. Alignment: 5 mm degenerative anterolisthesis at L4-5. Mild dextroconvex lumbar scoliosis with rotary component. Vertebrae: Facet edema bilaterally at L4-5 extending into the L5 pedicles Disc desiccation at  L3-4 and L4-5 with loss of disc height. Conus medullaris and cauda equina: Conus extends to the L1 level. Conus and cauda equina appear normal. Paraspinal and other soft tissues: Unremarkable Disc levels: T12-L1: Unremarkable L1-2: Unremarkable L2-3: No impingement.  Minimal disc bulge L3-4: Moderate to prominent central narrowing of the thecal sac with mild bilateral foraminal stenosis mild displacement of both L3 nerves in the  lateral extraforaminal space due to disc bulge, intervertebral spurring, facet arthropathy. L4-5: Prominent central narrowing of the thecal sac with moderate left and mild right subarticular lateral recess stenosis, mild right foraminal stenosis, mild displacement of both L4 nerves in the lateral extraforaminal space due to disc uncovering, disc bulge, left foraminal and lateral extraforaminal disc protrusion, and facet arthropathy. L5-S1: No impingement. Right greater than left degenerative facet arthropathy noted along with mild disc bulge, right inferior foraminal disc protrusion, and a small left synovial cyst along the ligamentum flavum (image 36, series 13). IMPRESSION: 1. Lumbar spondylosis and degenerative disc disease causing prominent impingement at L4-5, and moderate to prominent impingement at L3-4. 2. Degenerative facet edema bilaterally at L4-5 extending into the L5 pedicles. 3. Mild dextroconvex lumbar scoliosis with rotary component. Electronically Signed   By: Gaylyn Rong M.D.   On: 11/28/2022 13:47    Assessment & Plan:   Problem List Items Addressed This Visit     Essential hypertension     On Metoprolol Monitor BP      Low back pain - Primary    Severe LBP x weeks - LLE pain MRI w/severe L3-4, L4-5 nerve encroachment. Seeing [facility], pt had a steroid shot B. He was treated w/steroids, Norco.  Tramadol is not working for his LBP. Pain is 8/10. It was 10/10....  D/c Tramadol Start Norco instead until he gets to the Pain clinic via   [facility]      Relevant Medications   HYDROcodone-acetaminophen (NORCO/VICODIN) 5-325 MG tablet   Sciatica    L>>R Severe LBP x weeks - LLE pain MRI w/severe L3-4, L4-5 nerve encroachment. Seeing [facility], pt had a steroid shot B. He was treated w/steroids, Norco.  Tramadol is not working for his LBP. Pain is 8/10. It was 10/10....  D/c Tramadol Start Norco instead until he gets to the Pain clinic via  [facility]         Meds ordered this encounter  Medications   HYDROcodone-acetaminophen (NORCO/VICODIN) 5-325 MG tablet    Sig: Take 1 tablet by mouth every 6 (six) hours as needed for severe pain.    Dispense:  120 tablet    Refill:  0      Follow-up: Return in about 4 weeks (around 01/18/2023) for a follow-up visit.  Sonda Primes, MD

## 2022-12-21 NOTE — Assessment & Plan Note (Signed)
On Metoprolol Monitor BP

## 2022-12-21 NOTE — Assessment & Plan Note (Signed)
L>>R Severe LBP x weeks - LLE pain MRI w/severe L3-4, L4-5 nerve encroachment. Seeing [facility], pt had a steroid shot B. He was treated w/steroids, Norco.  Tramadol is not working for his LBP. Pain is 8/10. It was 10/10....  D/c Tramadol Start Norco instead until he gets to the Pain clinic via  [facility]

## 2022-12-22 MED ORDER — HYDROCODONE-ACETAMINOPHEN 10-325 MG PO TABS: 0.5000 | ORAL_TABLET | Freq: Three times a day (TID) | ORAL | 0 refills | Status: DC | PRN

## 2022-12-22 NOTE — Telephone Encounter (Signed)
OK. Thx

## 2022-12-22 NOTE — Telephone Encounter (Signed)
MD sent new hydrocodone to [facility].Marland KitchenRaechel Chute

## 2022-12-22 NOTE — Addendum Note (Signed)
Addended by: Tresa Garter on: 12/22/2022 07:46 AM   Modules accepted: Orders

## 2023-01-09 ENCOUNTER — Other Ambulatory Visit: Payer: Self-pay | Admitting: Internal Medicine

## 2023-01-18 ENCOUNTER — Ambulatory Visit: Payer: Managed Care, Other (non HMO) | Admitting: Internal Medicine

## 2023-01-18 ENCOUNTER — Encounter: Payer: Self-pay | Admitting: Internal Medicine

## 2023-01-18 DIAGNOSIS — E785 Hyperlipidemia, unspecified: Secondary | ICD-10-CM

## 2023-01-18 DIAGNOSIS — F413 Other mixed anxiety disorders: Secondary | ICD-10-CM

## 2023-01-18 DIAGNOSIS — I10 Essential (primary) hypertension: Secondary | ICD-10-CM | POA: Diagnosis not present

## 2023-01-18 DIAGNOSIS — M5442 Lumbago with sciatica, left side: Secondary | ICD-10-CM | POA: Diagnosis not present

## 2023-01-18 MED ORDER — HYDROCODONE-ACETAMINOPHEN 7.5-325 MG PO TABS
ORAL_TABLET | ORAL | 0 refills | Status: DC
Start: 2023-01-18 — End: 2023-01-27

## 2023-01-18 NOTE — Progress Notes (Signed)
Subjective:  Patient ID: Jonathon Griffin, male    DOB: [birth year]  Age: 67 y.o. MRN: [id number]  CC: Follow-up (4 WEEK F/U)   HPI Jonathon Griffin presents for LBP. Worse w/walking and standing. Sitting down is ok  Norco works better, however the pain is severe without meds.  Tramadol was not helping with this new kind of back pain. On epidural shots and in PT. One shot is due tomorrow  Outpatient Medications Prior to Visit  Medication Sig Dispense Refill   aspirin EC 81 MG tablet Take 1 tablet (81 mg total) by mouth daily. 90 tablet 3   atorvastatin (LIPITOR) 80 MG tablet TAKE 1 TABLET BY MOUTH EVERY DAY AT 6PM 90 tablet 3   B Complex-Folic Acid (B COMPLEX PLUS) TABS Take 1 tablet by mouth daily. 100 tablet 3   Cholecalciferol (VITAMIN D3) 50 MCG (2000 UT) capsule Take 1 capsule (2,000 Units total) by mouth daily. 100 capsule 3   famotidine (PEPCID) 20 MG tablet TAKE 1 TABLET BY MOUTH TWICE A DAY 180 tablet 2   lamoTRIgine (LAMICTAL) 100 MG tablet TAKE 1 TABLET BY MOUTH EVERY DAY 90 tablet 1   metoprolol succinate (TOPROL-XL) 25 MG 24 hr tablet TAKE 1 TABLET BY MOUTH EVERY DAY 90 tablet 3   tadalafil (CIALIS) 5 MG tablet Take 1 tablet (5 mg total) by mouth daily. 90 tablet 3   zolpidem (AMBIEN) 10 MG tablet Take 1 tablet (10 mg total) by mouth at bedtime as needed for sleep. 90 tablet 1   acetaminophen (TYLENOL) 500 MG tablet Take 500 mg by mouth every 6 (six) hours as needed for moderate pain.     HYDROcodone-acetaminophen (NORCO) 10-325 MG tablet Take 0.5 tablets by mouth every 8 (eight) hours as needed. 60 tablet 0   HYDROcodone-acetaminophen (NORCO/VICODIN) 5-325 MG tablet Take 1 tablet by mouth every 6 (six) hours as needed for severe pain. 120 tablet 0   No facility-administered medications prior to visit.    ROS: Review of Systems  Constitutional:  Negative for appetite change, fatigue and unexpected weight change.  HENT:  Negative for congestion, nosebleeds, sneezing, sore  throat and trouble swallowing.   Eyes:  Negative for itching and visual disturbance.  Respiratory:  Negative for cough.   Cardiovascular:  Negative for chest pain, palpitations and leg swelling.  Gastrointestinal:  Negative for abdominal distention, blood in stool, diarrhea and nausea.  Genitourinary:  Negative for frequency and hematuria.  Musculoskeletal:  Positive for back pain and gait problem. Negative for joint swelling and neck pain.  Skin:  Negative for rash.  Neurological:  Negative for dizziness, tremors, speech difficulty and weakness.  Psychiatric/Behavioral:  Negative for agitation, dysphoric mood, sleep disturbance and suicidal ideas. The patient is not nervous/anxious.     Objective:  BP 118/62 (BP Location: Left Arm, Patient Position: Sitting, Cuff Size: Normal)   Pulse 61   Temp 98 F (36.7 C) (Oral)   Ht 5\' 10"  (1.778 m)   Wt 211 lb (95.7 kg)   SpO2 90%   BMI 30.28 kg/m   BP Readings from Last 3 Encounters:  01/18/23 118/62  12/21/22 110/70  08/23/22 102/70    Wt Readings from Last 3 Encounters:  01/18/23 211 lb (95.7 kg)  12/21/22 207 lb (93.9 kg)  08/23/22 207 lb (93.9 kg)    Physical Exam Constitutional:      General: He is not in acute distress.    Appearance: He is well-developed.  Comments: NAD  Eyes:     Conjunctiva/sclera: Conjunctivae normal.     Pupils: Pupils are equal, round, and reactive to light.  Neck:     Thyroid: No thyromegaly.     Vascular: No JVD.  Cardiovascular:     Rate and Rhythm: Normal rate and regular rhythm.     Heart sounds: Normal heart sounds. No murmur heard.    No friction rub. No gallop.  Pulmonary:     Effort: Pulmonary effort is normal. No respiratory distress.     Breath sounds: Normal breath sounds. No wheezing or rales.  Chest:     Chest wall: No tenderness.  Abdominal:     General: Bowel sounds are normal. There is no distension.     Palpations: Abdomen is soft. There is no mass.     Tenderness:  There is no abdominal tenderness. There is no guarding or rebound.  Musculoskeletal:        General: Tenderness present. Normal range of motion.     Cervical back: Normal range of motion.     Right lower leg: No edema.     Left lower leg: No edema.  Lymphadenopathy:     Cervical: No cervical adenopathy.  Skin:    General: Skin is warm and dry.     Findings: No rash.  Neurological:     Mental Status: He is alert and oriented to person, place, and time.     Cranial Nerves: No cranial nerve deficit.     Motor: No abnormal muscle tone.     Coordination: Coordination normal.     Gait: Gait normal.     Deep Tendon Reflexes: Reflexes are normal and symmetric.  Psychiatric:        Behavior: Behavior normal.        Thought Content: Thought content normal.        Judgment: Judgment normal.   Lumbar spine-pain with range of motion  Lab Results  Component Value Date   WBC 6.4 08/28/2022   HGB 14.1 08/28/2022   HCT 41.8 08/28/2022   PLT 189.0 08/28/2022   GLUCOSE 105 (H) 08/28/2022   CHOL 116 08/28/2022   TRIG 175.0 (H) 08/28/2022   HDL 37.00 (L) 08/28/2022   LDLDIRECT 51.0 03/24/2020   LDLCALC 44 08/28/2022   ALT 25 08/28/2022   AST 20 08/28/2022   NA 140 08/28/2022   K 4.3 08/28/2022   CL 105 08/28/2022   CREATININE 1.29 08/28/2022   BUN 16 08/28/2022   CO2 28 08/28/2022   TSH 2.52 08/28/2022   PSA 0.53 08/28/2022   INR 2.3 (H) 05/14/2019   HGBA1C 6.3 08/28/2022    MR LUMBAR SPINE WO CONTRAST  Result Date: 11/28/2022 CLINICAL DATA:  Low back pain extending into the right lower extremity for 1 month. Prior fusion 2011. EXAM: MRI LUMBAR SPINE WITHOUT CONTRAST TECHNIQUE: Multiplanar, multisequence MR imaging of the lumbar spine was performed. No intravenous contrast was administered. COMPARISON:  Radiographs 02/14/2018 FINDINGS: Segmentation: The lowest lumbar type non-rib-bearing vertebra is labeled as L5. Alignment: 5 mm degenerative anterolisthesis at L4-5. Mild dextroconvex  lumbar scoliosis with rotary component. Vertebrae: Facet edema bilaterally at L4-5 extending into the L5 pedicles Disc desiccation at L3-4 and L4-5 with loss of disc height. Conus medullaris and cauda equina: Conus extends to the L1 level. Conus and cauda equina appear normal. Paraspinal and other soft tissues: Unremarkable Disc levels: T12-L1: Unremarkable L1-2: Unremarkable L2-3: No impingement.  Minimal disc bulge L3-4: Moderate to prominent central narrowing  of the thecal sac with mild bilateral foraminal stenosis mild displacement of both L3 nerves in the lateral extraforaminal space due to disc bulge, intervertebral spurring, facet arthropathy. L4-5: Prominent central narrowing of the thecal sac with moderate left and mild right subarticular lateral recess stenosis, mild right foraminal stenosis, mild displacement of both L4 nerves in the lateral extraforaminal space due to disc uncovering, disc bulge, left foraminal and lateral extraforaminal disc protrusion, and facet arthropathy. L5-S1: No impingement. Right greater than left degenerative facet arthropathy noted along with mild disc bulge, right inferior foraminal disc protrusion, and a small left synovial cyst along the ligamentum flavum (image 36, series 13). IMPRESSION: 1. Lumbar spondylosis and degenerative disc disease causing prominent impingement at L4-5, and moderate to prominent impingement at L3-4. 2. Degenerative facet edema bilaterally at L4-5 extending into the L5 pedicles. 3. Mild dextroconvex lumbar scoliosis with rotary component. Electronically Signed   By: Gaylyn Rong M.D.   On: 11/28/2022 13:47    Assessment & Plan:   Problem List Items Addressed This Visit     Hyperlipidemia    On Lipitor      Essential hypertension - Primary     On Metoprolol Monitor BP      Anxiety disorder    Doing well      Low back pain    Severe LBP x weeks - LLE pain MRI w/severe L3-4, L4-5 nerve encroachment. Seeing [facility], pt had a steroid shot B. He was treated w/steroids, Norco.  Tramadol is not working for his LBP. Pain is 8/10. It was 10/10....  D/c Tramadol Start Norco instead until he gets to the Pain clinic via  [facility] LBP. Worse w/walking and standing. Sitting down is ok 12/2022 Norco works better, however the pain is severe without meds.  Tramadol was not helping with this new kind of back pain. On epidural shots and in PT. One shot is due tomorrow      Relevant Medications   HYDROcodone-acetaminophen (NORCO) 7.5-325 MG tablet      Meds ordered this encounter  Medications   HYDROcodone-acetaminophen (NORCO) 7.5-325 MG tablet    Sig: Take one tablet 3-4 times a day prn severe pain    Dispense:  120 tablet    Refill:  0      Follow-up: Return in about 3 months (around 04/20/2023) for a follow-up visit.  Sonda Primes, MD

## 2023-01-25 ENCOUNTER — Encounter: Payer: Self-pay | Admitting: Internal Medicine

## 2023-01-25 NOTE — Assessment & Plan Note (Signed)
On Lipitor 

## 2023-01-25 NOTE — Assessment & Plan Note (Signed)
On Metoprolol Monitor BP

## 2023-01-25 NOTE — Assessment & Plan Note (Signed)
Doing well 

## 2023-01-25 NOTE — Assessment & Plan Note (Signed)
Severe LBP x weeks - LLE pain MRI w/severe L3-4, L4-5 nerve encroachment. Seeing [facility], pt had a steroid shot B. He was treated w/steroids, Norco.  Tramadol is not working for his LBP. Pain is 8/10. It was 10/10....  D/c Tramadol Start Norco instead until he gets to the Pain clinic via  [facility] LBP. Worse w/walking and standing. Sitting down is ok 12/2022 Norco works better, however the pain is severe without meds.  Tramadol was not helping with this new kind of back pain. On epidural shots and in PT. One shot is due tomorrow

## 2023-01-26 ENCOUNTER — Telehealth: Payer: Self-pay | Admitting: Internal Medicine

## 2023-01-26 NOTE — Telephone Encounter (Signed)
Pt called stating the Pharmacy do not have Hydrocodone 7mg  and asking if they could do like before and give him the 10 mg and the pt broke them in half. Please advise

## 2023-01-27 MED ORDER — HYDROCODONE-ACETAMINOPHEN 10-325 MG PO TABS: 0.5000 | ORAL_TABLET | Freq: Four times a day (QID) | ORAL | 0 refills | Status: DC | PRN

## 2023-01-27 NOTE — Telephone Encounter (Signed)
OK. Thx

## 2023-02-01 ENCOUNTER — Other Ambulatory Visit: Payer: Self-pay | Admitting: Cardiovascular Disease

## 2023-02-12 ENCOUNTER — Telehealth: Payer: Self-pay | Admitting: Cardiovascular Disease

## 2023-02-12 NOTE — Telephone Encounter (Signed)
Spoke with wife per DPR. She is aware its ok take gabapentin and it can cause sedation. She verbalized understanding

## 2023-02-12 NOTE — Telephone Encounter (Signed)
Spoke with wife per DPR she states patient has been prescribed gabapentin 300 mg. She would like to know if its ok to take with his cardiac medications

## 2023-02-12 NOTE — Telephone Encounter (Signed)
Ok to take. Patient should be aware that it can cause sedation.

## 2023-02-12 NOTE — Telephone Encounter (Signed)
Pt spouse returning call

## 2023-02-12 NOTE — Telephone Encounter (Signed)
Pt c/o medication issue:  1. Name of Medication: Gabapentin   2. How are you currently taking this medication (dosage and times per day)?   3. Are you having a reaction (difficulty breathing--STAT)?   4. What is your medication issue? Patient was prescribed this medication and patient's wife would like to make sure it will be okay for patient to take with current meds. Please advise.

## 2023-02-12 NOTE — Telephone Encounter (Signed)
Left voicemail to return call to office.

## 2023-02-21 ENCOUNTER — Other Ambulatory Visit: Payer: Self-pay | Admitting: Internal Medicine

## 2023-02-26 ENCOUNTER — Telehealth: Payer: Self-pay | Admitting: Internal Medicine

## 2023-02-26 NOTE — Telephone Encounter (Signed)
Prescription Request  02/26/2023  LOV: 01/18/2023  What is the name of the medication or equipment?  HYDROcodone-acetaminophen (NORCO) 10-325 MG tablet  zolpidem (AMBIEN) 10 MG tablet  Have you contacted your pharmacy to request a refill? No   Which pharmacy would you like this sent to?  [id number] [place], [place] - [address] [place] [address] Phone: [phone] Fax: [phone]    Patient notified that their request is being sent to the clinical staff for review and that they should receive a response within 2 business days.   Please advise at Mobile [phone] (mobile)

## 2023-02-28 MED ORDER — HYDROCODONE-ACETAMINOPHEN 10-325 MG PO TABS: 0.5000 | ORAL_TABLET | Freq: Four times a day (QID) | ORAL | 0 refills | Status: DC | PRN

## 2023-02-28 MED ORDER — ZOLPIDEM TARTRATE 10 MG PO TABS: 10.0000 mg | ORAL_TABLET | Freq: Every evening | ORAL | 1 refills | Status: DC | PRN

## 2023-02-28 NOTE — Telephone Encounter (Signed)
Okay.  Done.  Thanks 

## 2023-03-27 ENCOUNTER — Telehealth: Payer: Self-pay | Admitting: Internal Medicine

## 2023-03-27 NOTE — Telephone Encounter (Signed)
Prescription Request  03/27/2023  LOV: 01/18/2023  What is the name of the medication or equipment?  HYDROcodone-acetaminophen (NORCO) 10-325 MG tablet   Have you contacted your pharmacy to request a refill? Yes   Which pharmacy would you like this sent to?  [id number] [place], [place] - [address] AT [address] [place] [address] Phone: [phone] Fax: [phone]    Patient notified that their request is being sent to the clinical staff for review and that they should receive a response within 2 business days.   Please advise at Mobile [phone] (mobile)

## 2023-03-28 MED ORDER — HYDROCODONE-ACETAMINOPHEN 10-325 MG PO TABS: 0.5000 | ORAL_TABLET | Freq: Four times a day (QID) | ORAL | 0 refills | Status: DC | PRN

## 2023-03-28 NOTE — Telephone Encounter (Signed)
Okay.  Thanks.

## 2023-04-03 ENCOUNTER — Telehealth: Payer: Self-pay | Admitting: *Deleted

## 2023-04-03 NOTE — Telephone Encounter (Signed)
   Pre-operative Risk Assessment    Patient Name: Jonathon Griffin  DOB: 08-07-1955 MRN: [id number]  DATE OF LAST VISIT: 07/18/22 DR. O'NEAL DATE OF NEXT VISIT: 07/23/23 DR. O'NEAL    Request for Surgical Clearance    Procedure:   LUMBAR FUSION  Date of Surgery:  Clearance TBD                                 Surgeon:  DR. Hoyt Koch Surgeon's Group or Practice Name:  [facility] Phone number:  [phone] EXT 221 Fax number:  [phone] ATN: [id number]   Type of Clearance Requested:   - Medical  - Pharmacy:  Hold Aspirin NOT INDICATED IF NEEDING TO HOLD   Type of Anesthesia:  General    Additional requests/questions:    [name]   04/03/2023, 8:50 AM

## 2023-04-03 NOTE — Telephone Encounter (Signed)
Left message to call back to set up tele pre op appt.  

## 2023-04-03 NOTE — Telephone Encounter (Signed)
   Name: Jonathon Griffin  DOB: [birth year]  MRN: [id number]  Primary Cardiologist: Reatha Harps, MD   Preoperative team, please contact this patient and set up a phone call appointment for further preoperative risk assessment. Please obtain consent and complete medication review. Thank you for your help. Last seen by Dr. Flora Lipps on 07/18/2022.  I confirm that guidance regarding antiplatelet and oral anticoagulation therapy has been completed and, if necessary, noted below.  Per office protocol, if patient is without any new symptoms or concerns at the time of their virtual visit, he may hold ASA for 7 days prior to procedure if requested by the surgeon.  There was no formal inquiry about this in the clearance request.  Please resume ASA as soon as possible postprocedure, at the discretion of the surgeon.    I also confirmed the patient resides in the state of [place]. As per [facility] Medical Board telemedicine laws, the patient must reside in the state in which the provider is licensed.   Joni Reining, NP 04/03/2023, 10:07 AM [facility]

## 2023-04-04 ENCOUNTER — Telehealth: Payer: Self-pay | Admitting: *Deleted

## 2023-04-04 ENCOUNTER — Telehealth: Payer: Self-pay | Admitting: Cardiovascular Disease

## 2023-04-04 NOTE — Telephone Encounter (Signed)
I s/w the pt and he said his wife is just concerned that he won't get his surgery done in time before the end of the year since we cannot get tele visit done sooner. I assured the pt that if I had a sooner appt I would offer it but when we scheduled the tele 04/18/23 was the available tele pre op appt. Pt said that is ok and he understands. I did add him to the wait list incase someone does cancel their surgery. Pt said thank you.

## 2023-04-04 NOTE — Telephone Encounter (Signed)
Pt has been scheduled tele pre op appt 04/18/23. Med rec and consent are done.     Patient Consent for Virtual Visit        Jonathon Griffin has provided verbal consent on 04/04/2023 for a virtual visit (video or telephone).   CONSENT FOR VIRTUAL VISIT FOR:  Jonathon Griffin  By participating in this virtual visit I agree to the following:  I hereby voluntarily request, consent and authorize [facility] and its employed or contracted physicians, physician assistants, nurse practitioners or other licensed health care professionals (the Practitioner), to provide me with telemedicine health care services (the "Services") as deemed necessary by the treating Practitioner. I acknowledge and consent to receive the Services by the Practitioner via telemedicine. I understand that the telemedicine visit will involve communicating with the Practitioner through live audiovisual communication technology and the disclosure of certain medical information by electronic transmission. I acknowledge that I have been given the opportunity to request an in-person assessment or other available alternative prior to the telemedicine visit and am voluntarily participating in the telemedicine visit.  I understand that I have the right to withhold or withdraw my consent to the use of telemedicine in the course of my care at any time, without affecting my right to future care or treatment, and that the Practitioner or I may terminate the telemedicine visit at any time. I understand that I have the right to inspect all information obtained and/or recorded in the course of the telemedicine visit and may receive copies of available information for a reasonable fee.  I understand that some of the potential risks of receiving the Services via telemedicine include:  Delay or interruption in medical evaluation due to technological equipment failure or disruption; Information transmitted may not be sufficient (e.g. poor  resolution of images) to allow for appropriate medical decision making by the Practitioner; and/or  In rare instances, security protocols could fail, causing a breach of personal health information.  Furthermore, I acknowledge that it is my responsibility to provide information about my medical history, conditions and care that is complete and accurate to the best of my ability. I acknowledge that Practitioner's advice, recommendations, and/or decision may be based on factors not within their control, such as incomplete or inaccurate data provided by me or distortions of diagnostic images or specimens that may result from electronic transmissions. I understand that the practice of medicine is not an exact science and that Practitioner makes no warranties or guarantees regarding treatment outcomes. I acknowledge that a copy of this consent can be made available to me via my patient portal [facility] MyChart), or I can request a printed copy by calling the office of [facility].    I understand that my insurance will be billed for this visit.   I have read or had this consent read to me. I understand the contents of this consent, which adequately explains the benefits and risks of the Services being provided via telemedicine.  I have been provided ample opportunity to ask questions regarding this consent and the Services and have had my questions answered to my satisfaction. I give my informed consent for the services to be provided through the use of telemedicine in my medical care

## 2023-04-04 NOTE — Telephone Encounter (Signed)
See previous encounter(s). Patient's wife is requesting a call back to reschedule 11/27 telehealth appointment.

## 2023-04-04 NOTE — Telephone Encounter (Signed)
Pt returning nurses phone call. Please advise ?

## 2023-04-04 NOTE — Telephone Encounter (Signed)
Pt has been scheduled tele pre op appt 04/18/23. Med rec and consent are done.

## 2023-04-06 ENCOUNTER — Telehealth: Payer: Self-pay | Admitting: Internal Medicine

## 2023-04-06 NOTE — Telephone Encounter (Signed)
Patient called and said he is having surgery soon. He was informed that he needs clearance from his PCP and that they faxed over a form for Dr. Posey Rea to fill out. Best callback is [phone].

## 2023-04-10 NOTE — Telephone Encounter (Signed)
Patient's wife Clara called to see if the form was received. She would like a call back at [phone].

## 2023-04-11 NOTE — Telephone Encounter (Signed)
Spoke with pts wife and was able to inform them that the paperwork has been faxed and pt can go on to the next step.

## 2023-04-18 ENCOUNTER — Ambulatory Visit: Payer: Managed Care, Other (non HMO) | Attending: Cardiology

## 2023-04-18 DIAGNOSIS — Z01810 Encounter for preprocedural cardiovascular examination: Secondary | ICD-10-CM | POA: Diagnosis not present

## 2023-04-18 NOTE — Progress Notes (Addendum)
Virtual Visit via Telephone Note   Because of Jonathon Griffin's co-morbid illnesses, he is at least at moderate risk for complications without adequate follow up.  This format is felt to be most appropriate for this patient at this time.  The patient did not have access to video technology/had technical difficulties with video requiring transitioning to audio format only (telephone).  All issues noted in this document were discussed and addressed.  No physical exam could be performed with this format.  Please refer to the patient's chart for his consent to telehealth for Jonathon Griffin.  Evaluation Performed:  Preoperative cardiovascular risk assessment _____________   Date:  04/18/2023   Patient ID:  Jonathon Griffin, DOB Dec 24, 1955, MRN [id number] Patient Location:  Home Provider location:   Office  Primary Care Provider:  Tresa Garter, MD Primary Cardiologist:  Jonathon Harps, MD  Chief Complaint / Patient Profile   67 y.o. y/o male with a h/o coronary artery disease, hyperlipidemia, hypertension who is pending lumbar fusion and presents today for telephonic preoperative cardiovascular risk assessment.  History of Present Illness    Jonathon Griffin is a 67 y.o. male who presents via audio/video conferencing for a telehealth visit today.  Pt was last seen in cardiology clinic on 07/18/2022 by Jonathon Griffin.  At that time Jonathon Griffin was doing well .  The patient is now pending procedure as outlined above. Since his last visit, he remains stable from a cardiac standpoint.  Today he denies chest pain, shortness of breath, lower extremity edema, fatigue, palpitations, melena, hematuria, hemoptysis, diaphoresis, weakness, presyncope, syncope, orthopnea, and PND.   Past Medical History    Past Medical History:  Diagnosis Date   Acute on chronic respiratory failure with hypoxia (HCC)    Acute ST elevation myocardial infarction (STEMI) (HCC)    Anxiety    Arthritis     Aspiration pneumonia due to gastric secretions (HCC)    Cardiac arrest (HCC)    Coronary artery disease    Depression    Embolic stroke involving anterior cerebral artery (HCC)    GERD (gastroesophageal reflux disease)    Hyperlipidemia    Hypertension    Obstructive sleep apnea    Sleep apnea    Past Surgical History:  Procedure Laterality Date   bicep surgery     CARDIAC CATHETERIZATION     EVALUATION UNDER ANESTHESIA WITH HEMORRHOIDECTOMY N/A 04/06/2021   Procedure: LIFT REPAIR OF Intersphenteric perirectal FISTULA. HEMORRHOID ligation. ANORECTAL EXAMINATION UNDER ANESTHESIA;  Surgeon: Karie Soda, MD;  Location: [facility];  Service: General;  Laterality: N/A;  GEN w/ERAS PATHWAY LOCAL   PEG PLACEMENT     TRACHEOSTOMY      Allergies  Allergies  Allergen Reactions   Nsaids Other (See Comments)    Told to avoid with h/o MI   Oxycodone Itching   Wellbutrin [Bupropion]     tremors    Home Medications    Prior to Admission medications   Medication Sig Start Date End Date Taking? Authorizing Provider  aspirin EC 81 MG tablet Take 1 tablet (81 mg total) by mouth daily. 07/28/19   Jonathon Rives, MD  atorvastatin (LIPITOR) 80 MG tablet TAKE 1 TABLET BY MOUTH EVERY DAY AT 6PM 08/11/22   Jonathon Ramp, MD  B Complex-Folic Acid (B COMPLEX PLUS) TABS Take 1 tablet by mouth daily. 08/23/22   Plotnikov, Georgina Quint, MD  Cholecalciferol (VITAMIN D3) 50 MCG (2000 UT) capsule Take 1 capsule (  2,000 Units total) by mouth daily. 06/25/19   Plotnikov, Georgina Quint, MD  famotidine (PEPCID) 20 MG tablet TAKE 1 TABLET BY MOUTH TWICE A DAY 02/01/23   Jonathon, Ronnald Ramp, MD  gabapentin (NEURONTIN) 300 MG capsule Take 300 mg by mouth 3 (three) times daily. 03/09/23   [provider]  HYDROcodone-acetaminophen (NORCO) 10-325 MG tablet Take 0.5 tablets by mouth every 6 (six) hours as needed for severe pain (pain score 7-10). 03/28/23   Plotnikov, Georgina Quint, MD  lamoTRIgine (LAMICTAL)  100 MG tablet TAKE 1 TABLET BY MOUTH EVERY DAY 01/11/23   Plotnikov, Georgina Quint, MD  metoprolol succinate (TOPROL-XL) 25 MG 24 hr tablet TAKE 1 TABLET BY MOUTH EVERY DAY 10/13/22   Jonathon, Ronnald Ramp, MD  tadalafil (CIALIS) 5 MG tablet Take 1 tablet (5 mg total) by mouth daily. 08/23/22   Plotnikov, Georgina Quint, MD  zolpidem (AMBIEN) 10 MG tablet Take 1 tablet (10 mg total) by mouth at bedtime as needed. for sleep 02/28/23   Plotnikov, Georgina Quint, MD    Physical Exam    Vital Signs:  Jonathon Griffin does not have vital signs available for review today.  Given telephonic nature of communication, physical exam is limited. AAOx3. NAD. Normal affect.  Speech and respirations are unlabored.  Accessory Clinical Findings    None  Assessment & Plan    1.  Preoperative Cardiovascular Risk Assessment: Lumbar fusion, TBD, Dr. Hoyt Koch, [facility], [phone]      Primary Cardiologist: Jonathon Harps, MD  Chart reviewed as part of pre-operative protocol coverage. Given past medical history and time since last visit, based on ACC/AHA guidelines, [name] would be at acceptable risk for the planned procedure without further cardiovascular testing.   His RCRI is moderate risk, 6.6% risk of major cardiac event.  He is able to complete greater than 4 METS of physical activity.  His aspirin may be held for 7 days prior to his procedure.  Please resume as soon as hemostasis is achieved postoperatively.  Patient was advised that if he develops new symptoms prior to surgery to contact our office to arrange a follow-up appointment.  He verbalized understanding.  I will route this recommendation to the requesting party via Epic fax function and remove from pre-op pool.  Please call with questions.  Jonathon Griffin. Jonathon Lagoy NP-C     04/18/2023, 9:03 AM [facility] [address] Office [phone] Fax [phone]  I spent greater  than 5 minutes during the telephone encounter for evaluation of diagnosis, medication, management & plan.  Prior to patient's phone evaluation I spent greater than 10 minutes reviewing their past medical history and cardiac medications.

## 2023-04-18 NOTE — Telephone Encounter (Signed)
Patient states this clearance was never received - please refax to:  [phone] for Dr. Maisie Fus

## 2023-04-24 NOTE — Telephone Encounter (Signed)
I was able to speak with the surgical office and they have provided me with an alternative fax number... I have sent the paperwork via the fax number.

## 2023-04-25 NOTE — Telephone Encounter (Signed)
Patient's wife called and said Dr. Maisie Fus' has still not received the fax. They would like for it to be re-sent again.

## 2023-04-26 ENCOUNTER — Telehealth: Payer: Self-pay | Admitting: Internal Medicine

## 2023-04-26 NOTE — Telephone Encounter (Signed)
I've tried re-faxing paperwork over the facility x3 days and keep getting a busy or no answer response to the fax.

## 2023-04-26 NOTE — Telephone Encounter (Signed)
Prescription Request  04/26/2023  LOV: 01/18/2023  What is the name of the medication or equipment? HYDROcodone-acetaminophen (NORCO) 10-325 MG tablet   Have you contacted your pharmacy to request a refill? Yes   Which pharmacy would you like this sent to?  [id number] [place], [place] - [address] [place] [address] Phone: [phone] Fax: [phone]    Patient notified that their request is being sent to the clinical staff for review and that they should receive a response within 2 business days.   Please advise at Mobile [phone] (mobile)

## 2023-04-27 MED ORDER — HYDROCODONE-ACETAMINOPHEN 10-325 MG PO TABS: 0.5000 | ORAL_TABLET | Freq: Four times a day (QID) | ORAL | 0 refills | Status: DC | PRN

## 2023-04-27 NOTE — Telephone Encounter (Signed)
Okay.  He needs to see me every 3 months.  Thanks

## 2023-05-01 NOTE — Telephone Encounter (Signed)
[facility] is still requesting that we re-fax the for.  They have given 2 fax numbers to try: [phone]

## 2023-05-04 ENCOUNTER — Other Ambulatory Visit: Payer: Self-pay | Admitting: Neurosurgery

## 2023-05-25 ENCOUNTER — Other Ambulatory Visit: Payer: Self-pay | Admitting: Internal Medicine

## 2023-05-25 ENCOUNTER — Telehealth: Payer: Self-pay | Admitting: Internal Medicine

## 2023-05-25 NOTE — Telephone Encounter (Signed)
 Copied from CRM [id number]. Topic: Clinical - Medication Refill >> May 25, 2023  9:35 AM Eleanor C wrote: Most Recent Primary Care Visit:  Provider: GARALD KARLYNN GAILS  Department: [facility]  Visit Type: OFFICE VISIT  Date: 01/18/2023  Medication: HYDROcodone -acetaminophen  (NORCO) 10-325 MG tablet  Has the patient contacted their pharmacy? Yes (Agent: If no, request that the patient contact the pharmacy for the refill. If patient does not wish to contact the pharmacy document the reason why and proceed with request.) (Agent: If yes, when and what did the pharmacy advise?)  Is this the correct pharmacy for this prescription? Yes If no, delete pharmacy and type the correct one.  This is the patient's preferred pharmacy:  [id number] [place], [place] - [address] [place] [address] Phone: [phone] Fax: [phone]   Has the prescription been filled recently? No  Is the patient out of the medication? No- Will be out on Sunday   Has the patient been seen for an appointment in the last year OR does the patient have an upcoming appointment? Yes  Can we respond through MyChart? Yes  Agent: Please be advised that Rx refills may take up to 3 business days. We ask that you follow-up with your pharmacy.

## 2023-05-25 NOTE — Telephone Encounter (Signed)
 Pt has been scheduled for 1.13.25

## 2023-05-27 NOTE — Telephone Encounter (Signed)
 Jonathon Griffin has to be seen every 3 months to maintain this prescription active.  Thank you

## 2023-05-29 ENCOUNTER — Other Ambulatory Visit: Payer: Self-pay | Admitting: Neurosurgery

## 2023-06-04 ENCOUNTER — Encounter: Payer: Self-pay | Admitting: Internal Medicine

## 2023-06-04 ENCOUNTER — Ambulatory Visit: Payer: Managed Care, Other (non HMO) | Admitting: Internal Medicine

## 2023-06-04 VITALS — BP 120/82 | HR 85 | Temp 98.6°F | Ht 70.0 in | Wt 209.0 lb

## 2023-06-04 DIAGNOSIS — M5442 Lumbago with sciatica, left side: Secondary | ICD-10-CM

## 2023-06-04 DIAGNOSIS — I2583 Coronary atherosclerosis due to lipid rich plaque: Secondary | ICD-10-CM

## 2023-06-04 DIAGNOSIS — I2510 Atherosclerotic heart disease of native coronary artery without angina pectoris: Secondary | ICD-10-CM

## 2023-06-04 DIAGNOSIS — M180 Bilateral primary osteoarthritis of first carpometacarpal joints: Secondary | ICD-10-CM | POA: Diagnosis not present

## 2023-06-04 DIAGNOSIS — F413 Other mixed anxiety disorders: Secondary | ICD-10-CM | POA: Diagnosis not present

## 2023-06-04 MED ORDER — HYDROCODONE-ACETAMINOPHEN 10-325 MG PO TABS: 0.5000 | ORAL_TABLET | Freq: Four times a day (QID) | ORAL | 0 refills | Status: DC | PRN

## 2023-06-04 NOTE — Assessment & Plan Note (Signed)
 Doing well

## 2023-06-04 NOTE — Assessment & Plan Note (Signed)
Tramadol prn renewed  Potential benefits of a long term opioids use as well as potential risks (i.e. addiction risk, apnea etc) and complications (i.e. Somnolence, constipation and others) were explained to the patient and were aknowledged.

## 2023-06-04 NOTE — Assessment & Plan Note (Signed)
 Severe LBP x weeks - LLE pain MRI w/severe L3-4, L4-5 nerve encroachment. Seeing [facility] [place] [facility], pt had a steroid shot B. He was treated w/steroids, Norco.  Tramadol  is not working for his LBP. Pain is 8/10. It was 10/10....  D/c Tramadol  Start Norco instead until he gets to the Pain clinic via  [facility] [place] [facility] LBP. Worse w/walking and standing. Sitting down is ok 12/2022 Norco works better, however the pain is severe without meds.  Tramadol  was not helping with this new kind of back pain. On epidural shots and in PT. One shot is due tomorrow  Surgery is planned - 06/19/2023 Norco renewed Norco works better, however the pain is severe without meds.  Tramadol  was not helping with this new kind of back pain. On epidural shots and in PT. One shot is due tomorrow

## 2023-06-04 NOTE — Progress Notes (Signed)
 Subjective:  Patient ID: Jonathon Griffin, male    DOB: Oct 15, 1955  Age: 68 y.o. MRN: [id number]  CC: Medication Refill   HPI Jonathon Griffin presents for LBP, anxiety  Outpatient Medications Prior to Visit  Medication Sig Dispense Refill   aspirin  EC 81 MG tablet Take 1 tablet (81 mg total) by mouth daily. 90 tablet 3   atorvastatin  (LIPITOR) 80 MG tablet TAKE 1 TABLET BY MOUTH EVERY DAY AT 6PM 90 tablet 3   B Complex-Folic Acid (B COMPLEX PLUS) TABS Take 1 tablet by mouth daily. 100 tablet 3   Cholecalciferol (VITAMIN D3) 50 MCG (2000 UT) capsule Take 1 capsule (2,000 Units total) by mouth daily. 100 capsule 3   famotidine  (PEPCID ) 20 MG tablet TAKE 1 TABLET BY MOUTH TWICE A DAY 180 tablet 3   gabapentin  (NEURONTIN ) 300 MG capsule Take 300 mg by mouth 3 (three) times daily.     lamoTRIgine  (LAMICTAL ) 100 MG tablet TAKE 1 TABLET BY MOUTH EVERY DAY 90 tablet 1   metoprolol  succinate (TOPROL -XL) 25 MG 24 hr tablet TAKE 1 TABLET BY MOUTH EVERY DAY 90 tablet 3   tadalafil  (CIALIS ) 5 MG tablet Take 1 tablet (5 mg total) by mouth daily. 90 tablet 3   zolpidem  (AMBIEN ) 10 MG tablet Take 1 tablet (10 mg total) by mouth at bedtime as needed. for sleep 90 tablet 1   HYDROcodone -acetaminophen  (NORCO) 10-325 MG tablet Take 0.5 tablets by mouth every 6 (six) hours as needed for severe pain (pain score 7-10). 60 tablet 0   No facility-administered medications prior to visit.    ROS: Review of Systems  Constitutional:  Negative for appetite change, fatigue and unexpected weight change.  HENT:  Negative for congestion, nosebleeds, sneezing, sore throat and trouble swallowing.   Eyes:  Negative for itching and visual disturbance.  Respiratory:  Negative for cough.   Cardiovascular:  Negative for chest pain, palpitations and leg swelling.  Gastrointestinal:  Negative for abdominal distention, blood in stool, diarrhea and nausea.  Genitourinary:  Negative for frequency and hematuria.   Musculoskeletal:  Positive for back pain and neck stiffness. Negative for gait problem, joint swelling and neck pain.  Skin:  Negative for rash.  Neurological:  Negative for dizziness, tremors, speech difficulty and weakness.  Psychiatric/Behavioral:  Negative for agitation, dysphoric mood, sleep disturbance and suicidal ideas. The patient is nervous/anxious.     Objective:  BP 120/82 (BP Location: Left Arm, Patient Position: Sitting, Cuff Size: Normal)   Pulse 85   Temp 98.6 F (37 C) (Oral)   Ht 5' 10 (1.778 m)   Wt 209 lb (94.8 kg)   SpO2 95%   BMI 29.99 kg/m   BP Readings from Last 3 Encounters:  06/04/23 120/82  01/18/23 118/62  12/21/22 110/70    Wt Readings from Last 3 Encounters:  06/04/23 209 lb (94.8 kg)  01/18/23 211 lb (95.7 kg)  12/21/22 207 lb (93.9 kg)    Physical Exam Constitutional:      General: He is not in acute distress.    Appearance: Normal appearance. He is well-developed.     Comments: NAD  Eyes:     Conjunctiva/sclera: Conjunctivae normal.     Pupils: Pupils are equal, round, and reactive to light.  Neck:     Thyroid : No thyromegaly.     Vascular: No JVD.  Cardiovascular:     Rate and Rhythm: Normal rate and regular rhythm.     Heart sounds: Normal heart sounds.  No murmur heard.    No friction rub. No gallop.  Pulmonary:     Effort: Pulmonary effort is normal. No respiratory distress.     Breath sounds: Normal breath sounds. No wheezing or rales.  Chest:     Chest wall: No tenderness.  Abdominal:     General: Bowel sounds are normal. There is no distension.     Palpations: Abdomen is soft. There is no mass.     Tenderness: There is no abdominal tenderness. There is no guarding or rebound.  Musculoskeletal:        General: Tenderness present. Normal range of motion.     Cervical back: Normal range of motion.     Right lower leg: No edema.     Left lower leg: No edema.  Lymphadenopathy:     Cervical: No cervical adenopathy.   Skin:    General: Skin is warm and dry.     Findings: No rash.  Neurological:     Mental Status: He is alert and oriented to person, place, and time.     Cranial Nerves: No cranial nerve deficit.     Motor: No abnormal muscle tone.     Coordination: Coordination normal.     Gait: Gait normal.     Deep Tendon Reflexes: Reflexes are normal and symmetric.  Psychiatric:        Behavior: Behavior normal.        Thought Content: Thought content normal.        Judgment: Judgment normal.   LS w/pain  Lab Results  Component Value Date   WBC 6.4 08/28/2022   HGB 14.1 08/28/2022   HCT 41.8 08/28/2022   PLT 189.0 08/28/2022   GLUCOSE 105 (H) 08/28/2022   CHOL 116 08/28/2022   TRIG 175.0 (H) 08/28/2022   HDL 37.00 (L) 08/28/2022   LDLDIRECT 51.0 03/24/2020   LDLCALC 44 08/28/2022   ALT 25 08/28/2022   AST 20 08/28/2022   NA 140 08/28/2022   K 4.3 08/28/2022   CL 105 08/28/2022   CREATININE 1.29 08/28/2022   BUN 16 08/28/2022   CO2 28 08/28/2022   TSH 2.52 08/28/2022   PSA 0.53 08/28/2022   INR 2.3 (H) 05/14/2019   HGBA1C 6.3 08/28/2022    MR LUMBAR SPINE WO CONTRAST Result Date: 11/28/2022 CLINICAL DATA:  Low back pain extending into the right lower extremity for 1 month. Prior fusion 2011. EXAM: MRI LUMBAR SPINE WITHOUT CONTRAST TECHNIQUE: Multiplanar, multisequence MR imaging of the lumbar spine was performed. No intravenous contrast was administered. COMPARISON:  Radiographs 02/14/2018 FINDINGS: Segmentation: The lowest lumbar type non-rib-bearing vertebra is labeled as L5. Alignment: 5 mm degenerative anterolisthesis at L4-5. Mild dextroconvex lumbar scoliosis with rotary component. Vertebrae: Facet edema bilaterally at L4-5 extending into the L5 pedicles Disc desiccation at L3-4 and L4-5 with loss of disc height. Conus medullaris and cauda equina: Conus extends to the L1 level. Conus and cauda equina appear normal. Paraspinal and other soft tissues: Unremarkable Disc levels:  T12-L1: Unremarkable L1-2: Unremarkable L2-3: No impingement.  Minimal disc bulge L3-4: Moderate to prominent central narrowing of the thecal sac with mild bilateral foraminal stenosis mild displacement of both L3 nerves in the lateral extraforaminal space due to disc bulge, intervertebral spurring, facet arthropathy. L4-5: Prominent central narrowing of the thecal sac with moderate left and mild right subarticular lateral recess stenosis, mild right foraminal stenosis, mild displacement of both L4 nerves in the lateral extraforaminal space due to disc uncovering, disc bulge, left  foraminal and lateral extraforaminal disc protrusion, and facet arthropathy. L5-S1: No impingement. Right greater than left degenerative facet arthropathy noted along with mild disc bulge, right inferior foraminal disc protrusion, and a small left synovial cyst along the ligamentum flavum (image 36, series 13). IMPRESSION: 1. Lumbar spondylosis and degenerative disc disease causing prominent impingement at L4-5, and moderate to prominent impingement at L3-4. 2. Degenerative facet edema bilaterally at L4-5 extending into the L5 pedicles. 3. Mild dextroconvex lumbar scoliosis with rotary component. Electronically Signed   By: Ryan Salvage M.D.   On: 11/28/2022 13:47    Assessment & Plan:   Problem List Items Addressed This Visit     Anxiety disorder   Doing well      Low back pain - Primary   Severe LBP x weeks - LLE pain MRI w/severe L3-4, L4-5 nerve encroachment. Seeing [facility] [place], pt had a steroid shot B. He was treated w/steroids, Norco.  Tramadol  is not working for his LBP. Pain is 8/10. It was 10/10....  D/c Tramadol  Start Norco instead until he gets to the Pain clinic via  [facility] [place] LBP. Worse w/walking and standing. Sitting down is ok 12/2022 Norco works better, however the pain is severe without meds.  Tramadol  was not helping with this new kind of back pain. On epidural shots  and in PT. One shot is due tomorrow  Surgery is planned - 06/19/2023 Norco renewed Norco works better, however the pain is severe without meds.  Tramadol  was not helping with this new kind of back pain. On epidural shots and in PT. One shot is due tomorrow        Relevant Medications   HYDROcodone -acetaminophen  (NORCO) 10-325 MG tablet      Meds ordered this encounter  Medications   HYDROcodone -acetaminophen  (NORCO) 10-325 MG tablet    Sig: Take 0.5 tablets by mouth every 6 (six) hours as needed for severe pain (pain score 7-10).    Dispense:  60 tablet    Refill:  0      Follow-up: No follow-ups on file.  Marolyn Noel, MD

## 2023-06-04 NOTE — Assessment & Plan Note (Signed)
On Lipitor, aspirin, metoprolol

## 2023-06-12 NOTE — Pre-Procedure Instructions (Signed)
Surgical Instructions   Your procedure is scheduled on June 19, 2023. Report to [facility] Main Entrance "A" at 10:00 A.M., then check in with the Admitting office. Any questions or running late day of surgery: call [phone]  Questions prior to your surgery date: call [phone], Monday-Friday, 8am-4pm. If you experience any cold or flu symptoms such as cough, fever, chills, shortness of breath, etc. between now and your scheduled surgery, please notify us at the above number.     Remember:  Do not eat or drink after midnight the night before your surgery    Take these medicines the morning of surgery with A SIP OF WATER: famotidine (PEPCID)  lamoTRIgine (LAMICTAL)  metoprolol succinate (TOPROL-XL)    May take these medicines IF NEEDED: HYDROcodone-acetaminophen (NORCO)    Follow your surgeon's instructions on when to stop Aspirin.  If no instructions were given by your surgeon then you will need to call the office to get those instructions.     One week prior to surgery, STOP taking any Aleve, Naproxen, Ibuprofen, Motrin, Advil, Goody's, BC's, all herbal medications, fish oil, and non-prescription vitamins.                     Do NOT Smoke (Tobacco/Vaping) for 24 hours prior to your procedure.  If you use a CPAP at night, you may bring your mask/headgear for your overnight stay.   You will be asked to remove any contacts, glasses, piercing's, hearing aid's, dentures/partials prior to surgery. Please bring cases for these items if needed.    Patients discharged the day of surgery will not be allowed to drive home, and someone needs to stay with them for 24 hours.  SURGICAL WAITING ROOM VISITATION Patients may have no more than 2 support people in the waiting area - these visitors may rotate.   Pre-op nurse will coordinate an appropriate time for 1 ADULT support person, who may not rotate, to accompany patient in pre-op.  Children under the age of 73 must have an  adult with them who is not the patient and must remain in the main waiting area with an adult.  If the patient needs to stay at the hospital during part of their recovery, the visitor guidelines for inpatient rooms apply.  Please refer to the [facility] website for the visitor guidelines for any additional information.   If you received a COVID test during your pre-op visit  it is requested that you wear a mask when out in public, stay away from anyone that may not be feeling well and notify your surgeon if you develop symptoms. If you have been in contact with anyone that has tested positive in the last 10 days please notify you surgeon.      Pre-operative 5 CHG Bathing Instructions   You can play a key role in reducing the risk of infection after surgery. Your skin needs to be as free of germs as possible. You can reduce the number of germs on your skin by washing with CHG (chlorhexidine gluconate) soap before surgery. CHG is an antiseptic soap that kills germs and continues to kill germs even after washing.   DO NOT use if you have an allergy to chlorhexidine/CHG or antibacterial soaps. If your skin becomes reddened or irritated, stop using the CHG and notify one of our RNs at [phone].   Please shower with the CHG soap starting 4 days before surgery using the following schedule:     Please keep  in mind the following:  DO NOT shave, including legs and underarms, starting the day of your first shower.   You may shave your face at any point before/day of surgery.  Place clean sheets on your bed the day you start using CHG soap. Use a clean washcloth (not used since being washed) for each shower. DO NOT sleep with pets once you start using the CHG.   CHG Shower Instructions:  Wash your face and private area with normal soap. If you choose to wash your hair, wash first with your normal shampoo.  After you use shampoo/soap, rinse your hair and body thoroughly to remove shampoo/soap  residue.  Turn the water OFF and apply about 3 tablespoons (45 ml) of CHG soap to a CLEAN washcloth.  Apply CHG soap ONLY FROM YOUR NECK DOWN TO YOUR TOES (washing for 3-5 minutes)  DO NOT use CHG soap on face, private areas, open wounds, or sores.  Pay special attention to the area where your surgery is being performed.  If you are having back surgery, having someone wash your back for you may be helpful. Wait 2 minutes after CHG soap is applied, then you may rinse off the CHG soap.  Pat dry with a clean towel  Put on clean clothes/pajamas   If you choose to wear lotion, please use ONLY the CHG-compatible lotions that are listed below.  Additional instructions for the day of surgery: DO NOT APPLY any lotions, deodorants, cologne, or perfumes.   Do not bring valuables to the hospital. [facility] is not responsible for any belongings/valuables. Do not wear nail polish, gel polish, artificial nails, or any other type of covering on natural nails (fingers and toes) Do not wear jewelry or makeup Put on clean/comfortable clothes.  Please brush your teeth.  Ask your nurse before applying any prescription medications to the skin.     CHG Compatible Lotions   Aveeno Moisturizing lotion  Cetaphil Moisturizing Cream  Cetaphil Moisturizing Lotion  Clairol Herbal Essence Moisturizing Lotion, Dry Skin  Clairol Herbal Essence Moisturizing Lotion, Extra Dry Skin  Clairol Herbal Essence Moisturizing Lotion, Normal Skin  Curel Age Defying Therapeutic Moisturizing Lotion with Alpha Hydroxy  Curel Extreme Care Body Lotion  Curel Soothing Hands Moisturizing Hand Lotion  Curel Therapeutic Moisturizing Cream, Fragrance-Free  Curel Therapeutic Moisturizing Lotion, Fragrance-Free  Curel Therapeutic Moisturizing Lotion, Original Formula  Eucerin Daily Replenishing Lotion  Eucerin Dry Skin Therapy Plus Alpha Hydroxy Crme  Eucerin Dry Skin Therapy Plus Alpha Hydroxy Lotion  Eucerin Original Crme   Eucerin Original Lotion  Eucerin Plus Crme Eucerin Plus Lotion  Eucerin TriLipid Replenishing Lotion  Keri Anti-Bacterial Hand Lotion  Keri Deep Conditioning Original Lotion Dry Skin Formula Softly Scented  Keri Deep Conditioning Original Lotion, Fragrance Free Sensitive Skin Formula  Keri Lotion Fast Absorbing Fragrance Free Sensitive Skin Formula  Keri Lotion Fast Absorbing Softly Scented Dry Skin Formula  Keri Original Lotion  Keri Skin Renewal Lotion Keri Silky Smooth Lotion  Keri Silky Smooth Sensitive Skin Lotion  Nivea Body Creamy Conditioning Oil  Nivea Body Extra Enriched Lotion  Nivea Body Original Lotion  Nivea Body Sheer Moisturizing Lotion Nivea Crme  Nivea Skin Firming Lotion  NutraDerm 30 Skin Lotion  NutraDerm Skin Lotion  NutraDerm Therapeutic Skin Cream  NutraDerm Therapeutic Skin Lotion  ProShield Protective Hand Cream  Provon moisturizing lotion  Please read over the following fact sheets that you were given.

## 2023-06-13 ENCOUNTER — Encounter (HOSPITAL_COMMUNITY): Payer: Self-pay

## 2023-06-13 ENCOUNTER — Other Ambulatory Visit: Payer: Self-pay

## 2023-06-13 ENCOUNTER — Encounter (HOSPITAL_COMMUNITY)
Admission: RE | Admit: 2023-06-13 | Discharge: 2023-06-13 | Discharge status: Home / Self Care | Payer: Managed Care, Other (non HMO) | Source: Ambulatory Visit | Attending: Neurosurgery

## 2023-06-13 VITALS — BP 131/78 | HR 63 | Temp 97.5°F | Resp 18 | Ht 70.0 in | Wt 215.9 lb

## 2023-06-13 DIAGNOSIS — R9431 Abnormal electrocardiogram [ECG] [EKG]: Secondary | ICD-10-CM | POA: Insufficient documentation

## 2023-06-13 DIAGNOSIS — I2510 Atherosclerotic heart disease of native coronary artery without angina pectoris: Secondary | ICD-10-CM | POA: Insufficient documentation

## 2023-06-13 DIAGNOSIS — Z01812 Encounter for preprocedural laboratory examination: Secondary | ICD-10-CM | POA: Diagnosis present

## 2023-06-13 DIAGNOSIS — Z01818 Encounter for other preprocedural examination: Secondary | ICD-10-CM | POA: Insufficient documentation

## 2023-06-13 DIAGNOSIS — Z01810 Encounter for preprocedural cardiovascular examination: Secondary | ICD-10-CM | POA: Diagnosis present

## 2023-06-13 HISTORY — DX: Other psychoactive substance abuse, uncomplicated: F19.10

## 2023-06-13 LAB — CBC
HCT: 40.2 % (ref 39.0–52.0)
Hemoglobin: 13.4 g/dL (ref 13.0–17.0)
MCH: 30.7 pg (ref 26.0–34.0)
MCHC: 33.3 g/dL (ref 30.0–36.0)
MCV: 92.2 fL (ref 80.0–100.0)
Platelets: 188 10*3/uL (ref 150–400)
RBC: 4.36 MIL/uL (ref 4.22–5.81)
RDW: 13.1 % (ref 11.5–15.5)
WBC: 8 10*3/uL (ref 4.0–10.5)
nRBC: 0 % (ref 0.0–0.2)

## 2023-06-13 LAB — BASIC METABOLIC PANEL
Anion gap: 10 (ref 5–15)
BUN: 18 mg/dL (ref 8–23)
CO2: 24 mmol/L (ref 22–32)
Calcium: 9.4 mg/dL (ref 8.9–10.3)
Chloride: 104 mmol/L (ref 98–111)
Creatinine, Ser: 1.21 mg/dL (ref 0.61–1.24)
GFR, Estimated: >60 mL/min (ref >60)
Glucose, Bld: 110 mg/dL — ABNORMAL HIGH (ref 70–99)
Potassium: 4.3 mmol/L (ref 3.5–5.1)
Sodium: 138 mmol/L (ref 135–145)

## 2023-06-13 LAB — SURGICAL PCR SCREEN
MRSA, PCR: NEGATIVE
Staphylococcus aureus: POSITIVE — AB

## 2023-06-13 NOTE — Progress Notes (Signed)
PCP - Dr. Jacinta Shoe Cardiologist - Dr. Lennie Odor - last office visit 07/18/2022  PPM/ICD - Denies Device Orders - n/a Rep Notified - n/a  Chest x-ray - n/a EKG - 06/13/2023 Stress Test - Denies ECHO - 05/28/2019 Cardiac Cath - 04/06/2019  Sleep Study - +OSA in the past, but after Uvulopalatopharyngoplasty, Tonsillectomy and Septoplasty sx he has no issues CPAP - n/a  No DM  Last dose of GLP1 agonist- n/a GLP1 instructions: n/a  Blood Thinner Instructions: n/a Aspirin Instructions: Pt has already stopped ASA. Last dose was January 20th.  NPO after midnight  COVID TEST- n/a   Anesthesia review: Yes. Cardiac clearance   Patient denies shortness of breath, fever, cough and chest pain at PAT appointment. Pt denies any respiratory illness/infection in the last two months.   All instructions explained to the patient, with a verbal understanding of the material. Patient agrees to go over the instructions while at home for a better understanding. Patient also instructed to self quarantine after being tested for COVID-19. The opportunity to ask questions was provided.

## 2023-06-14 NOTE — Progress Notes (Signed)
Anesthesia Chart Review:  68 year old male follows with cardiology for history of HTN, HLD, multiple CVA (in setting of cardiac arrest), CAD (cardiac arrest 04/06/2019 secondary to inferior STEMI treated with PCI to RCA at [facility]).  Out-of-hospital cardiac arrest due to inferior STEMI at [facility] in [place] in November 2020. Status post PCI to the RCA. Course was complicated by extended stay in long-term care. Had aspiration pneumonia, liver injury, tracheostomy and PEG tube placement. Also had stroke which was related to cardiac arrest.  He was last seen by cardiologist Dr. Flora Lipps on 07/18/2022.  Per note, "He has recovered well. He is doing quite well. His EF is 55%. All of his risk factors are well-controlled. He will continue aspirin 81 mg daily. He will continue Lipitor 80 mg daily. His blood pressure is quite stable. We discussed proper exercise and improving his diet. He overall seems to be doing well and all of his risk factors are well-controlled. He will see [place] yearly.".  Seen by Edd Fabian, NP on 04/12/2023 for preop evaluation.  Per note, "Chart reviewed as part of pre-operative protocol coverage. Given past medical history and time since last visit, based on ACC/AHA guidelines, Jonathon Griffin would be at acceptable risk for the planned procedure without further cardiovascular testing. His RCRI is moderate risk, 6.6% risk of major cardiac event.  He is able to complete greater than 4 METS of physical activity. His aspirin may be held for 7 days prior to his procedure.  Please resume as soon as hemostasis is achieved postoperatively."  Prior history of OSA, but patient states no issues since undergoing uvulopalatopharyngoplasty, tonsillectomy, septoplasty.  Preop labs reviewed, WNL.  EKG 06/13/2023: Sinus bradycardia.  Rate 53.  Right bundle-branch block.  Possible inferior infarct, age-indeterminate.  T wave abnormality, consider inferior ischemia.  No  significant change.  Event monitor 08/2019: 1. Brief ectopic atrial tachycardia episodes (15 in 14 day wear period) with longest 20.5 seconds in duration.  2. No atrial fibrillation.  3. Rare ectopy.   TTE 05/28/2019: 1. Left ventricular ejection fraction, by visual estimation, is 50 to  55%. The left ventricle has low normal function. There is no left  ventricular hypertrophy.   2. Basal inferoseptal segment, basal inferolateral segment, basal  inferior segment, and mid inferolateral segment are abnormal.   3. Left ventricular diastolic parameters are indeterminate.   4. The left ventricle demonstrates regional wall motion abnormalities.   5. Global right ventricle has low normal systolic function.The right  ventricular size is normal. No increase in right ventricular wall  thickness.   6. Left atrial size was normal.   7. Right atrial size was normal.   8. The mitral valve is grossly normal. Trivial mitral valve  regurgitation.   9. The tricuspid valve is grossly normal.  10. The aortic valve is tricuspid. Aortic valve regurgitation is trivial.  11. The pulmonic valve was grossly normal. Pulmonic valve regurgitation is  trivial.  12. TR signal is inadequate for assessing pulmonary artery systolic  pressure.  13. The inferior vena cava is normal in size with greater than 50%  respiratory variability, suggesting right atrial pressure of 3 mmHg.     Jonathon Griffin [facility] Short Stay Center/Anesthesiology Phone [phone] 06/14/2023 11:06 AM

## 2023-06-14 NOTE — Progress Notes (Signed)
Received call from blood bank that patient has a history of antibodies.  Will need to have a T&S drawn on the day of surgery using the same blood band.

## 2023-06-14 NOTE — Anesthesia Preprocedure Evaluation (Addendum)
Anesthesia Evaluation  Patient identified by MRN, date of birth, ID band Patient awake    Reviewed: Allergy & Precautions, NPO status , Patient's Chart, lab work & pertinent test results  Airway Mallampati: II  TM Distance: >3 FB Neck ROM: Full    Dental no notable dental hx.    Pulmonary sleep apnea (s/p UPPP)    Pulmonary exam normal        Cardiovascular hypertension, Pt. on home beta blockers + CAD, + Past MI and + Cardiac Stents (x 1 in 2020)  Normal cardiovascular exam     Neuro/Psych  PSYCHIATRIC DISORDERS Anxiety Depression     Neuromuscular disease CVA, No Residual Symptoms    GI/Hepatic negative GI ROS, Neg liver ROS,,,  Endo/Other  negative endocrine ROS    Renal/GU negative Renal ROS     Musculoskeletal  (+) Arthritis ,    Abdominal   Peds  Hematology negative hematology ROS (+)   Anesthesia Other Findings LUMBAR BACK PAIN WITH RADICULOPATHY AFFECTING LEFT LOWER EXTREMITY  Reproductive/Obstetrics                              Anesthesia Physical Anesthesia Plan  ASA: 3  Anesthesia Plan: General   Post-op Pain Management:    Induction: Intravenous  PONV Risk Score and Plan: 2 and Ondansetron, Dexamethasone, Midazolam and Treatment may vary due to age or medical condition  Airway Management Planned: Oral ETT  Additional Equipment:   Intra-op Plan:   Post-operative Plan: Extubation in OR  Informed Consent: I have reviewed the patients History and Physical, chart, labs and discussed the procedure including the risks, benefits and alternatives for the proposed anesthesia with the patient or authorized representative who has indicated his/her understanding and acceptance.     Dental advisory given  Plan Discussed with: CRNA  Anesthesia Plan Comments: (PAT note by Antionette Poles, PA-C:  68 year old male follows with cardiology for history of HTN, HLD, multiple CVA (in  setting of cardiac arrest), CAD (cardiac arrest 04/06/2019 secondary to inferior STEMI treated with PCI to RCA at [facility]).  Out-of-hospital cardiac arrest due to inferior STEMI at [facility] in [place] in November 2020. Status post PCI to the RCA. Course was complicated by extended stay in long-term care. Had aspiration pneumonia, liver injury, tracheostomy and PEG tube placement. Also had stroke which was related to cardiac arrest.  He was last seen by cardiologist Dr. Flora Lipps on 07/18/2022.  Per note, "He has recovered well. He is doing quite well. His EF is 55%. All of his risk factors are well-controlled. He will continue aspirin 81 mg daily. He will continue Lipitor 80 mg daily. His blood pressure is quite stable. We discussed proper exercise and improving his diet. He overall seems to be doing well and all of his risk factors are well-controlled. He will see [place] yearly.".  Seen by Edd Fabian, NP on 04/12/2023 for preop evaluation.  Per note, "Chart reviewed as part of pre-operative protocol coverage. Given past medical history and time since last visit, based on ACC/AHA guidelines, Jonathon Griffin would be at acceptable risk for the planned procedure without further cardiovascular testing. His RCRI is moderate risk, 6.6% risk of major cardiac event.  He is able to complete greater than 4 METS of physical activity. His aspirin may be held for 7 days prior to his procedure.  Please resume as soon as hemostasis is achieved postoperatively."  Prior history of OSA, but patient states no issues since undergoing uvulopalatopharyngoplasty, tonsillectomy, septoplasty.  Preop labs reviewed, WNL.  EKG 06/13/2023: Sinus bradycardia.  Rate 53.  Right bundle-branch block.  Possible inferior infarct, age-indeterminate.  T wave abnormality, consider inferior ischemia.  No significant change.  Event monitor 08/2019: 1. Brief ectopic atrial tachycardia episodes (15 in 14 day wear  period) with longest 20.5 seconds in duration.  2. No atrial fibrillation.  3. Rare ectopy.   TTE 05/28/2019: 1. Left ventricular ejection fraction, by visual estimation, is 50 to  55%. The left ventricle has low normal function. There is no left  ventricular hypertrophy.   2. Basal inferoseptal segment, basal inferolateral segment, basal  inferior segment, and mid inferolateral segment are abnormal.   3. Left ventricular diastolic parameters are indeterminate.   4. The left ventricle demonstrates regional wall motion abnormalities.   5. Global right ventricle has low normal systolic function.The right  ventricular size is normal. No increase in right ventricular wall  thickness.   6. Left atrial size was normal.   7. Right atrial size was normal.   8. The mitral valve is grossly normal. Trivial mitral valve  regurgitation.   9. The tricuspid valve is grossly normal.  10. The aortic valve is tricuspid. Aortic valve regurgitation is trivial.  11. The pulmonic valve was grossly normal. Pulmonic valve regurgitation is  trivial.  12. TR signal is inadequate for assessing pulmonary artery systolic  pressure.  13. The inferior vena cava is normal in size with greater than 50%  respiratory variability, suggesting right atrial pressure of 3 mmHg.    )         Anesthesia Quick Evaluation

## 2023-06-19 ENCOUNTER — Observation Stay (HOSPITAL_COMMUNITY)
Admission: RE | Admit: 2023-06-19 | Discharge: 2023-06-20 | Disposition: A | Discharge status: Home / Self Care | Payer: Managed Care, Other (non HMO) | Source: Ambulatory Visit | Attending: Neurosurgery | Admitting: Neurosurgery

## 2023-06-19 ENCOUNTER — Ambulatory Visit (HOSPITAL_COMMUNITY): Payer: Managed Care, Other (non HMO)

## 2023-06-19 ENCOUNTER — Ambulatory Visit (HOSPITAL_BASED_OUTPATIENT_CLINIC_OR_DEPARTMENT_OTHER): Payer: Managed Care, Other (non HMO)

## 2023-06-19 ENCOUNTER — Other Ambulatory Visit: Payer: Self-pay

## 2023-06-19 ENCOUNTER — Ambulatory Visit (HOSPITAL_COMMUNITY): Payer: Managed Care, Other (non HMO) | Admitting: Physician Assistant

## 2023-06-19 ENCOUNTER — Encounter (HOSPITAL_COMMUNITY): Payer: Self-pay

## 2023-06-19 ENCOUNTER — Encounter (HOSPITAL_COMMUNITY)
Admission: RE | Disposition: A | Discharge status: Home / Self Care | Payer: Self-pay | Source: Ambulatory Visit | Attending: Neurosurgery

## 2023-06-19 DIAGNOSIS — I2510 Atherosclerotic heart disease of native coronary artery without angina pectoris: Secondary | ICD-10-CM | POA: Insufficient documentation

## 2023-06-19 DIAGNOSIS — M4316 Spondylolisthesis, lumbar region: Secondary | ICD-10-CM | POA: Diagnosis present

## 2023-06-19 DIAGNOSIS — Z79899 Other long term (current) drug therapy: Secondary | ICD-10-CM | POA: Diagnosis not present

## 2023-06-19 DIAGNOSIS — Z8673 Personal history of transient ischemic attack (TIA), and cerebral infarction without residual deficits: Secondary | ICD-10-CM | POA: Diagnosis not present

## 2023-06-19 DIAGNOSIS — Z955 Presence of coronary angioplasty implant and graft: Secondary | ICD-10-CM | POA: Diagnosis not present

## 2023-06-19 DIAGNOSIS — I10 Essential (primary) hypertension: Secondary | ICD-10-CM | POA: Insufficient documentation

## 2023-06-19 DIAGNOSIS — M5416 Radiculopathy, lumbar region: Secondary | ICD-10-CM

## 2023-06-19 DIAGNOSIS — Z7982 Long term (current) use of aspirin: Secondary | ICD-10-CM | POA: Diagnosis not present

## 2023-06-19 DIAGNOSIS — E785 Hyperlipidemia, unspecified: Secondary | ICD-10-CM | POA: Diagnosis not present

## 2023-06-19 DIAGNOSIS — M48061 Spinal stenosis, lumbar region without neurogenic claudication: Secondary | ICD-10-CM | POA: Diagnosis not present

## 2023-06-19 DIAGNOSIS — Z01818 Encounter for other preprocedural examination: Principal | ICD-10-CM

## 2023-06-19 HISTORY — PX: TRANSFORAMINAL LUMBAR INTERBODY FUSION W/ MIS 1 LEVEL: SHX6145

## 2023-06-19 LAB — TYPE AND SCREEN
ABO/RH(D): O POS
Antibody Screen: NEGATIVE

## 2023-06-19 SURGERY — MINIMALLY INVASIVE (MIS) TRANSFORAMINAL LUMBAR INTERBODY FUSION (TLIF) 1 LEVEL
Anesthesia: General | Site: Spine Lumbar | Laterality: Left

## 2023-06-19 MED ORDER — ONDANSETRON HCL 4 MG/2ML IJ SOLN
INTRAMUSCULAR | Status: DC | PRN
  Administered 2023-06-19: 4 mg via INTRAVENOUS

## 2023-06-19 MED ORDER — HYDROMORPHONE HCL 1 MG/ML IJ SOLN
0.5000 mg | INTRAMUSCULAR | Status: DC | PRN
  Administered 2023-06-19 (×2): 0.5 mg via INTRAVENOUS
  Filled 2023-06-19 (×2): qty 0.5

## 2023-06-19 MED ORDER — MUPIROCIN 2 % EX OINT: 1.0000 | TOPICAL_OINTMENT | Freq: Two times a day (BID) | CUTANEOUS | 0 refills | Status: AC

## 2023-06-19 MED ORDER — OXYCODONE HCL 5 MG PO TABS
ORAL_TABLET | ORAL | Status: AC
  Filled 2023-06-19: qty 2

## 2023-06-19 MED ORDER — 0.9 % SODIUM CHLORIDE (POUR BTL) OPTIME
TOPICAL | Status: DC | PRN
  Administered 2023-06-19: 1000 mL

## 2023-06-19 MED ORDER — PROPOFOL 10 MG/ML IV BOLUS
INTRAVENOUS | Status: DC | PRN
  Administered 2023-06-19: 100 mg via INTRAVENOUS

## 2023-06-19 MED ORDER — HYDROMORPHONE HCL 1 MG/ML IJ SOLN
INTRAMUSCULAR | Status: AC
  Filled 2023-06-19: qty 1

## 2023-06-19 MED ORDER — MUPIROCIN 2 % EX OINT
1.0000 | TOPICAL_OINTMENT | Freq: Two times a day (BID) | CUTANEOUS | Status: DC
  Administered 2023-06-19 – 2023-06-20 (×2): 1 via NASAL
  Filled 2023-06-19: qty 22

## 2023-06-19 MED ORDER — BUPIVACAINE LIPOSOME 1.3 % IJ SUSP
INTRAMUSCULAR | Status: AC
  Filled 2023-06-19: qty 20

## 2023-06-19 MED ORDER — CHLORHEXIDINE GLUCONATE CLOTH 2 % EX PADS: 6.0000 | MEDICATED_PAD | Freq: Once | CUTANEOUS | Status: DC

## 2023-06-19 MED ORDER — LACTATED RINGERS IV SOLN
INTRAVENOUS | Status: DC
Start: 2023-06-19 — End: 2023-06-19

## 2023-06-19 MED ORDER — ZOLPIDEM TARTRATE 5 MG PO TABS: 5.0000 mg | ORAL_TABLET | Freq: Every day | ORAL | Status: DC

## 2023-06-19 MED ORDER — OXYCODONE HCL 5 MG PO TABS
10.0000 mg | ORAL_TABLET | Freq: Once | ORAL | Status: AC
  Administered 2023-06-19: 10 mg via ORAL

## 2023-06-19 MED ORDER — THROMBIN 5000 UNITS EX SOLR
CUTANEOUS | Status: AC
  Filled 2023-06-19: qty 5000

## 2023-06-19 MED ORDER — FENTANYL CITRATE (PF) 100 MCG/2ML IJ SOLN
INTRAMUSCULAR | Status: AC
  Filled 2023-06-19: qty 2

## 2023-06-19 MED ORDER — SODIUM CHLORIDE 0.9% FLUSH: 3.0000 mL | INTRAVENOUS | Status: DC | PRN

## 2023-06-19 MED ORDER — BUPIVACAINE HCL (PF) 0.5 % IJ SOLN
INTRAMUSCULAR | Status: AC
  Filled 2023-06-19: qty 30

## 2023-06-19 MED ORDER — SUGAMMADEX SODIUM 200 MG/2ML IV SOLN
INTRAVENOUS | Status: DC | PRN
  Administered 2023-06-19: 200 mg via INTRAVENOUS

## 2023-06-19 MED ORDER — ACETAMINOPHEN 650 MG RE SUPP: 650.0000 mg | RECTAL | Status: DC | PRN

## 2023-06-19 MED ORDER — HYDROCODONE-ACETAMINOPHEN 5-325 MG PO TABS
2.0000 | ORAL_TABLET | ORAL | Status: DC | PRN
Start: 2023-06-19 — End: 2023-06-20
  Administered 2023-06-19 – 2023-06-20 (×4): 2 via ORAL
  Filled 2023-06-19 (×5): qty 2

## 2023-06-19 MED ORDER — LIDOCAINE-EPINEPHRINE 1 %-1:100000 IJ SOLN
INTRAMUSCULAR | Status: AC
  Filled 2023-06-19: qty 1

## 2023-06-19 MED ORDER — AMISULPRIDE (ANTIEMETIC) 5 MG/2ML IV SOLN: 10.0000 mg | Freq: Once | INTRAVENOUS | Status: DC | PRN

## 2023-06-19 MED ORDER — HYDROMORPHONE HCL 1 MG/ML IJ SOLN
0.5000 mg | INTRAMUSCULAR | Status: AC | PRN
  Administered 2023-06-19 (×4): 0.5 mg via INTRAVENOUS

## 2023-06-19 MED ORDER — MIDAZOLAM HCL 2 MG/2ML IJ SOLN
INTRAMUSCULAR | Status: AC
Start: 2023-06-19 — End: ?
  Filled 2023-06-19: qty 2

## 2023-06-19 MED ORDER — CHLORHEXIDINE GLUCONATE CLOTH 2 % EX PADS
6.0000 | MEDICATED_PAD | Freq: Every day | CUTANEOUS | Status: DC
  Administered 2023-06-20: 6 via TOPICAL

## 2023-06-19 MED ORDER — METHOCARBAMOL 500 MG PO TABS
500.0000 mg | ORAL_TABLET | Freq: Four times a day (QID) | ORAL | Status: DC | PRN
  Administered 2023-06-19 – 2023-06-20 (×3): 500 mg via ORAL
  Filled 2023-06-19 (×3): qty 1

## 2023-06-19 MED ORDER — POLYETHYLENE GLYCOL 3350 17 G PO PACK: 17.0000 g | PACK | Freq: Every day | ORAL | Status: DC | PRN

## 2023-06-19 MED ORDER — BUPIVACAINE LIPOSOME 1.3 % IJ SUSP
INTRAMUSCULAR | Status: DC | PRN
  Administered 2023-06-19: 20 mL

## 2023-06-19 MED ORDER — KETOROLAC TROMETHAMINE 15 MG/ML IJ SOLN
7.5000 mg | Freq: Four times a day (QID) | INTRAMUSCULAR | Status: DC
  Administered 2023-06-19 – 2023-06-20 (×3): 7.5 mg via INTRAVENOUS
  Filled 2023-06-19 (×3): qty 1

## 2023-06-19 MED ORDER — ONDANSETRON HCL 4 MG/2ML IJ SOLN: 4.0000 mg | Freq: Once | INTRAMUSCULAR | Status: DC | PRN

## 2023-06-19 MED ORDER — MIDAZOLAM HCL 2 MG/2ML IJ SOLN
INTRAMUSCULAR | Status: DC | PRN
  Administered 2023-06-19: 2 mg via INTRAVENOUS

## 2023-06-19 MED ORDER — METHOCARBAMOL 1000 MG/10ML IJ SOLN: 500.0000 mg | Freq: Four times a day (QID) | INTRAMUSCULAR | Status: DC | PRN

## 2023-06-19 MED ORDER — METOPROLOL SUCCINATE ER 25 MG PO TB24: 25.0000 mg | ORAL_TABLET | Freq: Every day | ORAL | Status: DC

## 2023-06-19 MED ORDER — ORAL CARE MOUTH RINSE: 15.0000 mL | Freq: Once | OROMUCOSAL | Status: AC

## 2023-06-19 MED ORDER — ROCURONIUM BROMIDE 10 MG/ML (PF) SYRINGE
PREFILLED_SYRINGE | INTRAVENOUS | Status: DC | PRN
  Administered 2023-06-19: 60 mg via INTRAVENOUS
  Administered 2023-06-19: 40 mg via INTRAVENOUS

## 2023-06-19 MED ORDER — DEXAMETHASONE SODIUM PHOSPHATE 10 MG/ML IJ SOLN
INTRAMUSCULAR | Status: DC | PRN
  Administered 2023-06-19: 10 mg via INTRAVENOUS

## 2023-06-19 MED ORDER — CEFAZOLIN SODIUM-DEXTROSE 2-4 GM/100ML-% IV SOLN
2.0000 g | INTRAVENOUS | Status: AC
Start: 2023-06-19 — End: 2023-06-19
  Administered 2023-06-19: 2 g via INTRAVENOUS
  Filled 2023-06-19: qty 100

## 2023-06-19 MED ORDER — ACETAMINOPHEN 10 MG/ML IV SOLN
1000.0000 mg | Freq: Once | INTRAVENOUS | Status: DC | PRN
  Administered 2023-06-19: 1000 mg via INTRAVENOUS

## 2023-06-19 MED ORDER — FENTANYL CITRATE (PF) 250 MCG/5ML IJ SOLN
INTRAMUSCULAR | Status: AC
  Filled 2023-06-19: qty 5

## 2023-06-19 MED ORDER — DOCUSATE SODIUM 100 MG PO CAPS
100.0000 mg | ORAL_CAPSULE | Freq: Two times a day (BID) | ORAL | Status: DC
  Administered 2023-06-19: 100 mg via ORAL
  Filled 2023-06-19: qty 1

## 2023-06-19 MED ORDER — BUPIVACAINE HCL (PF) 0.5 % IJ SOLN
INTRAMUSCULAR | Status: DC | PRN
  Administered 2023-06-19: 8.5 mL

## 2023-06-19 MED ORDER — LIDOCAINE 2% (20 MG/ML) 5 ML SYRINGE
INTRAMUSCULAR | Status: AC
  Filled 2023-06-19: qty 5

## 2023-06-19 MED ORDER — ONDANSETRON HCL 4 MG/2ML IJ SOLN
INTRAMUSCULAR | Status: AC
  Filled 2023-06-19: qty 2

## 2023-06-19 MED ORDER — HYDROCODONE-ACETAMINOPHEN 5-325 MG PO TABS
1.0000 | ORAL_TABLET | ORAL | Status: DC | PRN
  Filled 2023-06-19: qty 1

## 2023-06-19 MED ORDER — FENTANYL CITRATE (PF) 250 MCG/5ML IJ SOLN
INTRAMUSCULAR | Status: DC | PRN
  Administered 2023-06-19 (×2): 50 ug via INTRAVENOUS

## 2023-06-19 MED ORDER — THROMBIN 5000 UNITS EX SOLR
OROMUCOSAL | Status: DC | PRN
  Administered 2023-06-19 (×3): 5 mL via TOPICAL

## 2023-06-19 MED ORDER — FAMOTIDINE 20 MG PO TABS
20.0000 mg | ORAL_TABLET | Freq: Two times a day (BID) | ORAL | Status: DC
  Administered 2023-06-19: 20 mg via ORAL
  Filled 2023-06-19: qty 1

## 2023-06-19 MED ORDER — ACETAMINOPHEN 325 MG PO TABS: 650.0000 mg | ORAL_TABLET | ORAL | Status: DC | PRN

## 2023-06-19 MED ORDER — SODIUM CHLORIDE 0.9% FLUSH
3.0000 mL | Freq: Two times a day (BID) | INTRAVENOUS | Status: DC
  Administered 2023-06-19: 3 mL via INTRAVENOUS

## 2023-06-19 MED ORDER — PROPOFOL 10 MG/ML IV BOLUS
INTRAVENOUS | Status: AC
  Filled 2023-06-19: qty 20

## 2023-06-19 MED ORDER — DEXAMETHASONE SODIUM PHOSPHATE 10 MG/ML IJ SOLN
INTRAMUSCULAR | Status: AC
  Filled 2023-06-19: qty 1

## 2023-06-19 MED ORDER — LAMOTRIGINE 100 MG PO TABS: 100.0000 mg | ORAL_TABLET | Freq: Every day | ORAL | Status: DC

## 2023-06-19 MED ORDER — LIDOCAINE-EPINEPHRINE 1 %-1:100000 IJ SOLN
INTRAMUSCULAR | Status: DC | PRN
  Administered 2023-06-19: 8.5 mL

## 2023-06-19 MED ORDER — ATORVASTATIN CALCIUM 80 MG PO TABS
80.0000 mg | ORAL_TABLET | Freq: Every day | ORAL | Status: DC
  Administered 2023-06-19: 80 mg via ORAL
  Filled 2023-06-19: qty 1

## 2023-06-19 MED ORDER — LIDOCAINE 2% (20 MG/ML) 5 ML SYRINGE
INTRAMUSCULAR | Status: DC | PRN
  Administered 2023-06-19: 60 mg via INTRAVENOUS

## 2023-06-19 MED ORDER — ACETAMINOPHEN 10 MG/ML IV SOLN
INTRAVENOUS | Status: AC
  Filled 2023-06-19: qty 100

## 2023-06-19 MED ORDER — THROMBIN 5000 UNITS EX SOLR
CUTANEOUS | Status: AC
Start: 2023-06-19 — End: ?
  Filled 2023-06-19: qty 5000

## 2023-06-19 MED ORDER — MENTHOL 3 MG MT LOZG: 1.0000 | LOZENGE | OROMUCOSAL | Status: DC | PRN

## 2023-06-19 MED ORDER — PHENOL 1.4 % MT LIQD: 1.0000 | OROMUCOSAL | Status: DC | PRN

## 2023-06-19 MED ORDER — PHENYLEPHRINE HCL-NACL 20-0.9 MG/250ML-% IV SOLN
INTRAVENOUS | Status: DC | PRN
  Administered 2023-06-19: 25 ug/min via INTRAVENOUS

## 2023-06-19 MED ORDER — ONDANSETRON HCL 4 MG PO TABS: 4.0000 mg | ORAL_TABLET | Freq: Four times a day (QID) | ORAL | Status: DC | PRN

## 2023-06-19 MED ORDER — FLEET ENEMA RE ENEM: 1.0000 | ENEMA | Freq: Once | RECTAL | Status: DC | PRN

## 2023-06-19 MED ORDER — ONDANSETRON HCL 4 MG/2ML IJ SOLN: 4.0000 mg | Freq: Four times a day (QID) | INTRAMUSCULAR | Status: DC | PRN

## 2023-06-19 MED ORDER — CEFAZOLIN SODIUM-DEXTROSE 2-4 GM/100ML-% IV SOLN
2.0000 g | Freq: Four times a day (QID) | INTRAVENOUS | Status: AC
  Administered 2023-06-19 – 2023-06-20 (×2): 2 g via INTRAVENOUS
  Filled 2023-06-19 (×2): qty 100

## 2023-06-19 MED ORDER — SODIUM CHLORIDE 0.9 % IV SOLN: 250.0000 mL | INTRAVENOUS | Status: DC

## 2023-06-19 MED ORDER — CHLORHEXIDINE GLUCONATE 0.12 % MT SOLN
15.0000 mL | Freq: Once | OROMUCOSAL | Status: AC
  Administered 2023-06-19: 15 mL via OROMUCOSAL
  Filled 2023-06-19: qty 15

## 2023-06-19 MED ORDER — ROCURONIUM BROMIDE 10 MG/ML (PF) SYRINGE
PREFILLED_SYRINGE | INTRAVENOUS | Status: AC
  Filled 2023-06-19: qty 10

## 2023-06-19 MED ORDER — FENTANYL CITRATE (PF) 100 MCG/2ML IJ SOLN
25.0000 ug | INTRAMUSCULAR | Status: DC | PRN
  Administered 2023-06-19 (×3): 50 ug via INTRAVENOUS

## 2023-06-19 SURGICAL SUPPLY — 80 items
BAG COUNTER SPONGE SURGICOUNT (BAG) ×2 IMPLANT
BAND RUBBER #18 3X1/16 STRL (MISCELLANEOUS) ×4 IMPLANT
BASKET BONE COLLECTION (BASKET) IMPLANT
BLADE CLIPPER SURG (BLADE) IMPLANT
BLADE SURG 11 STRL SS (BLADE) ×2 IMPLANT
BUR 14 MATCH 3 (BUR) IMPLANT
BUR CARBIDE MATCH 3.0 (BURR) IMPLANT
BUR MATCHSTICK NEURO 3.0 LAGG (BURR) IMPLANT
BUR MR8 14 BALL 5 (BUR) IMPLANT
BUR SURG IBUR 4X12.5 (BURR) ×2 IMPLANT
BURR 14 MATCH 3 (BUR) ×2
BURR MR8 14 BALL 5 (BUR)
BURR SURG IBUR 4X12.5 (BURR) ×2
CANISTER SUCT 3000ML PPV (MISCELLANEOUS) ×2 IMPLANT
CNTNR URN SCR LID CUP LEK RST (MISCELLANEOUS) ×2 IMPLANT
COVER BACK TABLE 60X90IN (DRAPES) ×2 IMPLANT
COVERAGE SUPPORT O-ARM STEALTH (MISCELLANEOUS) ×2
DERMABOND ADVANCED .7 DNX12 (GAUZE/BANDAGES/DRESSINGS) ×4 IMPLANT
DRAIN JACKSON PRATT 10MM FLAT (MISCELLANEOUS) IMPLANT
DRAPE 3/4 80X56 (DRAPES) ×2 IMPLANT
DRAPE C-ARM 42X72 X-RAY (DRAPES) ×4 IMPLANT
DRAPE C-ARMOR (DRAPES) IMPLANT
DRAPE LAPAROTOMY 100X72X124 (DRAPES) ×2 IMPLANT
DRAPE MICROSCOPE SLANT 54X150 (MISCELLANEOUS) ×2 IMPLANT
DRAPE SHEET LG 3/4 BI-LAMINATE (DRAPES) ×8 IMPLANT
DRSG OPSITE POSTOP 4X6 (GAUZE/BANDAGES/DRESSINGS) IMPLANT
DURAPREP 26ML APPLICATOR (WOUND CARE) ×2 IMPLANT
ELECT BLADE INSULATED 6.5IN (ELECTROSURGICAL) ×2
ELECT COATED BLADE 2.86 ST (ELECTRODE) ×2 IMPLANT
ELECT REM PT RETURN 9FT ADLT (ELECTROSURGICAL) ×2
ELECTRODE BLDE INSULATED 6.5IN (ELECTROSURGICAL) ×2 IMPLANT
ELECTRODE REM PT RTRN 9FT ADLT (ELECTROSURGICAL) ×2 IMPLANT
EXTENDER TAB GUIDE SV 5.5/6.0 (INSTRUMENTS) IMPLANT
FEE COVERAGE SUPPORT O-ARM (MISCELLANEOUS) ×2 IMPLANT
GAUZE 4X4 16PLY ~~LOC~~+RFID DBL (SPONGE) IMPLANT
GAUZE SPONGE 4X4 12PLY STRL (GAUZE/BANDAGES/DRESSINGS) IMPLANT
GLOVE BIO SURGEON STRL SZ7 (GLOVE) ×8 IMPLANT
GLOVE BIOGEL PI IND STRL 7.5 (GLOVE) ×6 IMPLANT
GLOVE ECLIPSE 7.5 STRL STRAW (GLOVE) ×2 IMPLANT
GLOVE EXAM NITRILE XL STR (GLOVE) IMPLANT
GOWN STRL REUS W/ TWL LRG LVL3 (GOWN DISPOSABLE) ×2 IMPLANT
GOWN STRL REUS W/ TWL XL LVL3 (GOWN DISPOSABLE) ×4 IMPLANT
GOWN STRL REUS W/TWL 2XL LVL3 (GOWN DISPOSABLE) IMPLANT
GRAFT BONE PROTEIOS XS 0.5CC (Orthopedic Implant) IMPLANT
HEMOSTAT POWDER KIT SURGIFOAM (HEMOSTASIS) ×2 IMPLANT
KIT BASIN OR (CUSTOM PROCEDURE TRAY) ×2 IMPLANT
KIT TURNOVER KIT B (KITS) ×2 IMPLANT
KNIFE SURG 188MM BAYONET LONG (INSTRUMENTS) IMPLANT
MARKER SPHERE PSV REFLC NDI (MISCELLANEOUS) ×10 IMPLANT
MILL BONE PREP (MISCELLANEOUS) ×2 IMPLANT
NDL HYPO 18GX1.5 BLUNT FILL (NEEDLE) IMPLANT
NDL HYPO 21X1.5 SAFETY (NEEDLE) IMPLANT
NDL HYPO 22X1.5 SAFETY MO (MISCELLANEOUS) ×2 IMPLANT
NDL SPNL 18GX3.5 QUINCKE PK (NEEDLE) IMPLANT
NEEDLE HYPO 18GX1.5 BLUNT FILL (NEEDLE)
NEEDLE HYPO 21X1.5 SAFETY (NEEDLE)
NEEDLE HYPO 22X1.5 SAFETY MO (MISCELLANEOUS) ×2
NEEDLE SPNL 18GX3.5 QUINCKE PK (NEEDLE)
NS IRRIG 1000ML POUR BTL (IV SOLUTION) ×2 IMPLANT
PACK LAMINECTOMY NEURO (CUSTOM PROCEDURE TRAY) ×2 IMPLANT
PAD ARMBOARD 7.5X6 YLW CONV (MISCELLANEOUS) ×6 IMPLANT
PIN BONE FIX 150 (PIN) IMPLANT
PUTTY GRAFTON DBF 6CC W/DELIVE (Putty) IMPLANT
ROD 5.5 CCM PERC 40 (Rod) IMPLANT
ROD PERC CCM 5.5X35 (Rod) IMPLANT
SCREW MAS VOYAGER 7.5X45 (Screw) IMPLANT
SCREW SET 5.5/6.0MM SOLERA (Screw) IMPLANT
SPACER TLIF CATALYFT 9X40 (Spacer) IMPLANT
SPIKE FLUID TRANSFER (MISCELLANEOUS) ×2 IMPLANT
SPONGE SURGIFOAM ABS GEL 100 (HEMOSTASIS) IMPLANT
SPONGE T-LAP 4X18 ~~LOC~~+RFID (SPONGE) IMPLANT
SUT MNCRL AB 4-0 PS2 18 (SUTURE) ×2 IMPLANT
SUT VIC AB 0 CT1 18XCR BRD8 (SUTURE) ×2 IMPLANT
SUT VIC AB 2-0 CP2 18 (SUTURE) ×2 IMPLANT
SYR 30ML LL (SYRINGE) ×2 IMPLANT
TOWEL GREEN STERILE (TOWEL DISPOSABLE) ×2 IMPLANT
TOWEL GREEN STERILE FF (TOWEL DISPOSABLE) ×2 IMPLANT
TRAY FOLEY MTR SLVR 16FR STAT (SET/KITS/TRAYS/PACK) ×2 IMPLANT
TRAY FOLEY SLVR 14FR TEMP STAT (SET/KITS/TRAYS/PACK) IMPLANT
WATER STERILE IRR 1000ML POUR (IV SOLUTION) ×2 IMPLANT

## 2023-06-19 NOTE — Op Note (Signed)
PREOP DIAGNOSIS: L4-5 spondylolisthesis with radiculopathy  POSTOP DIAGNOSIS: L4-5 spondylolisthesis with radiculopathy  PROCEDURE: 1. L4-5 lumbar interbody fusion via left transforaminal approach with tubular retractors 2. Posterolateral arthrodesis, L4-5 3. MIS sublaminar decompression including contralateral foraminotomy 4. Placement of interbody cage L4-5 5. Nonsegmental instrumentation with percutaneously placed pedicle screw and rod construct at L4-5 6. Harvest of local autograft 7. Use of morselized allograft 8. Use of microscope for microdissection 9. Intraoperative neuronavigation with Stealth 10. Intraoperative CT scan  SURGEON: Dr. Hoyt Koch, MD  ASSISTANT: Patrici Ranks, PA.   Please note, there were no qualified trainees available to assist with the procedure.  An assistant was required for aid in retraction of the neural elements.   ANESTHESIA: General Endotracheal  EBL: 100 ml  IMPLANTS:  Medtronic 9 mm PL40 Catalyft cage, long 7.5 x 45 mm screws x 4 35 mm/40 mm rods DBF, ProteiOs  SPECIMENS: None  DRAINS: None  COMPLICATIONS: none  CONDITION: Stable to PACU  HISTORY: PATRIK TURNBAUGH is a 68 y.o. male who developed increasing back and left> right radicular pain.   MRI showed L4-5 stenosis and spondylolisthesis. Physical, medical and injection therapy failed to improve his symptoms durably.  Treatment options were discussed and the patient elected to proceed with MIS TLIF at L4-5. risks, benefits, alternatives, and expected convalescence were discussed with the patient.  Risks discussed included but were not limited to bleeding, pain, infection, scar, pseudoarthrosis, CSF leak, neurologic deficit, paralysis, and death.  The patient wished to proceed with surgery and informed consent was obtained.  PROCEDURE IN DETAIL: After informed consent was obtained and witnessed, the patient was brought to the operating room. After induction of general anesthesia,  the patient was positioned on the operative table in the prone position on a Jackson table with all pressure points meticulously padded. The skin of the low back was then prepped and draped in the usual sterile fashion.  A PSIS iliac spine was placed, and intraoperative CT obtained to allow for neuronavigation.  Using the Stealth navigation, paramedian incisions were planned for pedicle screw trajectories.  Incisions were made with a 10 blade.  Navigated high speed drill was used to cannulate the pedicles, and pedicles were then tapped using navigation.  Ball ended feeler confirmed good cannulation with no breaches.  On the side contralateral to the TLIF, appropriately sized pedicle screws were placed using navigation.   Navigated initial dilator was then docked on the L4-40facet  on the left side.  Sequential dilators and final tubular retractor was placed and locked in position, with navigation confirming good placement.  The microscope was then introduced in the field to allow for intraoperative microdissection.  Remaining muscle was moved off of the degenerated facet joint.  The inferior facets was removed using an osteotome and harvested for autograft.  Overgrown superior facets was then removed with rongeurs.  The traversing nerve root was identified and good decompression was performed with removal of overgrown ligament and facets with rongeurs.  The exiting nerve root in the foramen was also seen.  Epidural veins in the foramen were coagulated and cut with allowed access to the disc space lateral to the traversing nerve root.  The traversing nerve root was gently retracted medially to allow greater access to the disc.  The disc was opened with 15 blade and pituitary rongeur was used to remove disc material.  Disc shavers of increasing size was then used to clean the disc space as well as to restore disc space height.  The interbody space was prepared with rasps and curettes with removal of the cartilage  from the endplates.  Appropriate sized interbody spacer was then sized.  The inner body space was packed with morselized allograft mixed with autograft as well as ProteiOs.  An expandable size 9 mm interbody spacer was then tamped into place with a mallet under x-ray guidance.  It was then expanded until snug with the endplates.  The tube was then wanted more medially and the central and contralateral lamina, and contralateral medial facet was drilled away exposing the ligamentum flavum.  The dura was dissected from the ligamentum flavum and the ligament was removed with rongeurs.  Contralateral foraminotomy was performed with rongeurs.  Ball ended nerve hook confirmed good decompression.  The wound was then irrigated thoroughly with bacitracin impregnated irrigation and meticulous hemostasis was obtained.  The retractor was then removed and meticulous hemostasis was obtained in the muscle layer and subcutaneous layer.  Remaining pedicle screws on the left were then placed using navigation.  Good purchase was noted.  Rods were then passed through the screw towers bilaterally and secured with screw caps.  AP and lateral X-ray confirmed excellent placement of instrumentation and reduction of spondylolisthesis.  The screw caps were final tightened and the towers removed.  Navigated drill was used to decorticate the facet joint contralateral to the TLIF, and bone graft was placed in the facet and lateral gutter.  Wounds were irrigated thoroughly .  Exparel mixed with Marcaine was injected into the paraspinous muscles and subcutaneous tissues bilaterally.  The fascia was closed with 0 Vicryl stitches.  The dermal layer was closed with 2-0 Vicryl stitches in buried interrupted fashion.  The skin incisions were closed with 4-0 Monocryl subcuticular manner followed by Dermabond.  The PSIS pin was removed and small incision closed with 3-0 vicryl in buried fashion and Dermabond.  Sterile dressings were placed.  Patient was  then flipped supine and extubated by the anesthesia service following commands and all 4 extremities.  All counts were correct at the end of surgery.  No complications were noted.

## 2023-06-19 NOTE — H&P (Signed)
CC: back and leg pain  HPI:     This is a 68 year old man with history of CAD/mi in 2020 on aspirin, previous neck surgery with Dr. Donnella Bi who presents for evaluation of back and bilateral leg pain. Since June 2024, he has been having left greater than right leg pain which came on insidiously. It involves mostly his left buttock and posterolateral leg, though infrequently it also involves his right leg. It is worse with walking, squatting, and bending. It he has done physical therapy but after each of his 5 sessions, he felt significantly worse. He has tried L5-S1 ESI and subsequently L4-5 ESI which helped greatly but only for 4-5 days. He has been taking hydrocodone and gabapentin. He does not smoke. He is a Psychologist, occupational.     Patient Active Problem List   Diagnosis Date Noted   Low back pain 12/21/2022   Sciatica 12/21/2022   Diverticular disease of colon 10/20/2021   Old myocardial infarction 10/20/2021   Grief 10/20/2021   Weight gain 07/20/2021   History of ST elevation myocardial infarction (STEMI) 05/02/2021   Intersphincteric fistula s/p LIFT repair 04/06/2021 04/06/2021   Rectal fistula 03/26/2021   Hematochezia 08/19/2020   BPH (benign prostatic hyperplasia) 04/06/2020   Arthralgia 04/06/2020   Coronary artery disease 01/04/2020   AC joint pain 10/02/2019   Shoulder pain, left 10/02/2019   Erectile dysfunction 10/02/2019   Acute on chronic respiratory failure with hypoxia (HCC)    Obstructive sleep apnea    Aspiration pneumonia due to gastric secretions (HCC)    Acute ST elevation myocardial infarction (STEMI) (HCC)    Cardiac arrest (HCC)    Embolic stroke involving anterior cerebral artery (HCC)    MDD (major depressive disorder) 02/24/2018   Anxiety disorder 02/08/2018   Abdominal pain 01/16/2018   Weight loss, non-intentional 01/16/2018   Well adult exam 08/10/2017   Warts 08/10/2017   Acute bronchitis 07/07/2014   Cough 07/07/2014   Osteoarthritis 07/07/2014    Polycythemia, secondary 03/06/2014   Essential hypertension 03/06/2014   Insomnia 08/16/2012   Elevated blood pressure 06/14/2012   Patient exposure to body fluids 04/11/2012   Situational depression 04/11/2012   Hyperlipidemia 03/19/2009   Alcohol abuse, in remission 03/19/2009   Abnormal CBC measurement 03/19/2009   Elevated AST (SGOT) 03/19/2009   DYSPNEA 02/15/2009   PULMONARY FUNCTION TESTS, ABNORMAL 02/15/2009   Past Medical History:  Diagnosis Date   Acute on chronic respiratory failure with hypoxia (HCC)    Acute ST elevation myocardial infarction (STEMI) (HCC)    Arthritis    Aspiration pneumonia due to gastric secretions (HCC)    Cardiac arrest (HCC)    Coronary artery disease    Embolic stroke involving anterior cerebral artery (HCC)    GERD (gastroesophageal reflux disease)    Hyperlipidemia    Hypertension    Obstructive sleep apnea    Sleep apnea    Sleep Apnea surgery   Substance abuse (HCC)    Hx of ETOH Abuse. Sober since 2004    Past Surgical History:  Procedure Laterality Date   ANTERIOR CERVICAL DECOMP/DISCECTOMY FUSION  2011   BICEPS TENODESIS  2008   CARDIAC CATHETERIZATION  04/06/2019   Stents placed   COLONOSCOPY     EVALUATION UNDER ANESTHESIA WITH HEMORRHOIDECTOMY N/A 04/06/2021   Procedure: LIFT REPAIR OF Intersphenteric perirectal FISTULA. HEMORRHOID ligation. ANORECTAL EXAMINATION UNDER ANESTHESIA;  Surgeon: Karie Soda, MD;  Location: [facility];  Service: General;  Laterality: N/A;  GEN w/ERAS PATHWAY LOCAL  PEG PLACEMENT     TRACHEOSTOMY     UVULOPALATOPHARYNGOPLASTY, TONSILLECTOMY AND SEPTOPLASTY  11/14/2003    Medications Prior to Admission  Medication Sig Dispense Refill Last Dose/Taking   atorvastatin (LIPITOR) 80 MG tablet TAKE 1 TABLET BY MOUTH EVERY DAY AT 6PM 90 tablet 3 06/18/2023   Cholecalciferol (VITAMIN D3) 50 MCG (2000 UT) capsule Take 1 capsule (2,000 Units total) by mouth daily. 100 capsule 3 Past Week   famotidine  (PEPCID) 20 MG tablet TAKE 1 TABLET BY MOUTH TWICE A DAY 180 tablet 3 06/19/2023 at  7:00 AM   HYDROcodone-acetaminophen (NORCO) 10-325 MG tablet Take 0.5 tablets by mouth every 6 (six) hours as needed for severe pain (pain score 7-10). 60 tablet 0 06/19/2023 at  7:00 AM   lamoTRIgine (LAMICTAL) 100 MG tablet TAKE 1 TABLET BY MOUTH EVERY DAY 90 tablet 1 06/19/2023 at  7:00 AM   metoprolol succinate (TOPROL-XL) 25 MG 24 hr tablet TAKE 1 TABLET BY MOUTH EVERY DAY 90 tablet 3 06/19/2023 at  7:00 AM   tadalafil (CIALIS) 5 MG tablet Take 1 tablet (5 mg total) by mouth daily. (Patient taking differently: Take 5 mg by mouth daily as needed (ed).) 90 tablet 3 Past Month   zolpidem (AMBIEN) 10 MG tablet Take 1 tablet (10 mg total) by mouth at bedtime as needed. for sleep (Patient taking differently: Take 5 mg by mouth at bedtime.) 90 tablet 1 06/18/2023   aspirin EC 81 MG tablet Take 1 tablet (81 mg total) by mouth daily. 90 tablet 3 06/11/2023   Allergies  Allergen Reactions   Nsaids Other (See Comments)    Told to avoid with h/o MI   Oxycodone Itching   Wellbutrin [Bupropion]     tremors    Social History   Tobacco Use   Smoking status: Never   Smokeless tobacco: Never  Substance Use Topics   Alcohol use: No    Comment: Sober since 2004    Family History  Problem Relation Age of Onset   Arthritis Mother    Arthritis Sister      Review of Systems Pertinent items are noted in HPI.  Objective:   Patient Vitals for the past 8 hrs:  BP Temp Temp src Pulse Resp SpO2 Height Weight  06/19/23 0958 122/82 97.7 F (36.5 C) Oral (!) 54 18 98 % 5\' 10"  (1.778 m) 97.5 kg   No intake/output data recorded. No intake/output data recorded.      General : Alert, cooperative, no distress, appears stated age   Head:  Normocephalic/atraumatic    Eyes: PERRL, conjunctiva/corneas clear, EOM's intact. Fundi could not be visualized Neck: Supple Chest:  Respirations unlabored Chest wall: no tenderness or  deformity Heart: Regular rate and rhythm Abdomen: Soft, nontender and nondistended Extremities: warm and well-perfused Skin: normal turgor, color and texture Neurologic:  Alert, oriented x 3.  Eyes open spontaneously. PERRL, EOMI, VFC, no facial droop. V1-3 intact.  No dysarthria, tongue protrusion symmetric.  CNII-XII intact. Normal strength, sensation and reflexes throughout.  No pronator drift, full strength in legs. + L SLR.       Data ReviewCBC:  Lab Results  Component Value Date   WBC 8.0 06/13/2023   RBC 4.36 06/13/2023   BMP:  Lab Results  Component Value Date   GLUCOSE 110 (H) 06/13/2023   CO2 24 06/13/2023   BUN 18 06/13/2023   CREATININE 1.21 06/13/2023   CALCIUM 9.4 06/13/2023   Radiology review: see clinic note for details.  L4-5 Spondylolisthesis with stenosis  Assessment:   This is a 68 year old man with CAD and history of neck surgery who has L4-5 spondylolisthesis with stenosis and left greater than right radiculopathy refractory to physical therapy, medical therapy, injection therapy.I had a long discussion with the patient and discussed with him the natural history of lumbar spondylolisthesis and lumbar radiculopathy. I discussed treatment options including the option of L4-5 MIS TLIF. I discussed with him the general technique of surgery, as well as risks, benefits, alternatives, and expected convalescence. Risks discussed included, but were not limited to, bleeding, pain, infection, scar, instability, adjacent segment disease, pseudoarthrosis, damage to nearby organs, spinal fluid leak, neurologic deficit, and death. Informed consent was obtained and the patient wished to proceed with surgery.  All questions and concerns were answered.

## 2023-06-19 NOTE — Anesthesia Procedure Notes (Signed)
Procedure Name: Intubation Date/Time: 06/19/2023 12:52 PM  Performed by: April Holding, CRNAPre-anesthesia Checklist: Patient identified, Emergency Drugs available, Suction available and Patient being monitored Patient Re-evaluated:Patient Re-evaluated prior to induction Oxygen Delivery Method: Circle System Utilized Preoxygenation: Pre-oxygenation with 100% oxygen Induction Type: IV induction Ventilation: Mask ventilation without difficulty Laryngoscope Size: Miller and 2 Grade View: Grade I Tube type: Oral Tube size: 6.5 mm Number of attempts: 1 Airway Equipment and Method: Stylet, Oral airway and Bite block Placement Confirmation: ETT inserted through vocal cords under direct vision, positive ETCO2 and breath sounds checked- equal and bilateral Secured at: 23 cm Tube secured with: Tape Dental Injury: Teeth and Oropharynx as per pre-operative assessment  Comments: Previous trach in past. Unable to pass 7.0 ETT.  6.5 ETT passed without resistance

## 2023-06-19 NOTE — Transfer of Care (Signed)
Immediate Anesthesia Transfer of Care Note  Patient: Jonathon Griffin  Procedure(s) Performed: Minimally Invasive Transforaminal Lumbar Interbody Fusion, Posterior Decompression Lumbar four-five (Left: Spine Lumbar) Application of O-Arm (Left)  Patient Location: PACU  Anesthesia Type:General  Level of Consciousness: awake, alert , and oriented  Airway & Oxygen Therapy: Patient Spontanous Breathing and Patient connected to face mask oxygen  Post-op Assessment: Report given to RN and Post -op Vital signs reviewed and stable  Post vital signs: Reviewed and stable  Last Vitals:  Vitals Value Taken Time  BP 122/78 06/19/23 1556  Temp    Pulse 68 06/19/23 1557  Resp 16 06/19/23 1557  SpO2 99 % 06/19/23 1557  Vitals shown include unfiled device data.  Last Pain:  Vitals:   06/19/23 1030  TempSrc:   PainSc: 0-No pain         Complications: No notable events documented.

## 2023-06-20 DIAGNOSIS — M4316 Spondylolisthesis, lumbar region: Secondary | ICD-10-CM | POA: Diagnosis not present

## 2023-06-20 LAB — TYPE AND SCREEN
ABO/RH(D): O POS
Antibody Screen: NEGATIVE
Donor AG Type: NEGATIVE
Donor AG Type: NEGATIVE
Donor AG Type: NEGATIVE
Donor AG Type: NEGATIVE
Donor AG Type: NEGATIVE
Donor AG Type: NEGATIVE
Unit division: 0
Unit division: 0
Unit division: 0
Unit division: 0
Unit division: 0
Unit division: 0

## 2023-06-20 LAB — BPAM RBC
Blood Product Expiration Date: 202502262359
Blood Product Expiration Date: 202502242359
Blood Product Expiration Date: 202502252359
Blood Product Expiration Date: 202502252359
Blood Product Expiration Date: 202502272359
Blood Product Unit Number: 202502082359
Unit Type and Rh: 202502082359
Unit Type and Rh: 202502262359
Unit Type and Rh: 5100
Unit Type and Rh: 5100
Unit Type and Rh: 5100
Unit Type and Rh: 5100
Unit Type and Rh: 5100
Unit Type and Rh: 5100

## 2023-06-20 MED ORDER — DOCUSATE SODIUM 100 MG PO CAPS: 100.0000 mg | ORAL_CAPSULE | Freq: Two times a day (BID) | ORAL | 0 refills | Status: AC

## 2023-06-20 MED ORDER — METHOCARBAMOL 500 MG PO TABS: 500.0000 mg | ORAL_TABLET | Freq: Four times a day (QID) | ORAL | 0 refills | Status: DC | PRN

## 2023-06-20 MED ORDER — HYDROCODONE-ACETAMINOPHEN 5-325 MG PO TABS: 1.0000 | ORAL_TABLET | ORAL | 0 refills | Status: DC | PRN

## 2023-06-20 MED FILL — Thrombin For Soln 5000 Unit: CUTANEOUS | Qty: 5000 | Status: AC

## 2023-06-20 NOTE — Progress Notes (Signed)
Patient alert and oriented, void, ambulate. Surgical site clean and dry no sign of infection. D/c instructions explain and copy given to the patient all questions answered. Patient d/c home per order.

## 2023-06-20 NOTE — Discharge Instructions (Signed)
Wound Care Keep incision covered and dry until post op day 3. You may remove the Honeycomb dressing on post op day 3. Leave steri-strips on back.  They will fall off by themselves. Do not put any creams, lotions, or ointments on incision. You are fine to shower. Let water run over incision and pat dry.   Activity Walk each and every day, increasing distance each day. No lifting greater than 8 lbs.  No lifting no bending no twisting no driving or riding a car unless coming back and forth to see the doctor. If provided with back brace, wear when out of bed.  It is not necessary to wear brace in bed.  Diet Resume your normal diet.   Return to Work Will be discussed at your follow up appointment.  Call Your Doctor If Any of These Occur Redness, drainage, or swelling at the wound.  Temperature greater than 101 degrees. Severe pain not relieved by pain medication. Incision starts to come apart.  Follow Up Appt Call [phone] if you have one or any problem.

## 2023-06-20 NOTE — Anesthesia Postprocedure Evaluation (Signed)
Anesthesia Post Note  Patient: Jonathon Griffin  Procedure(s) Performed: Minimally Invasive Transforaminal Lumbar Interbody Fusion, Posterior Decompression Lumbar four-five (Left: Spine Lumbar) Application of O-Arm (Left)     Patient location during evaluation: PACU Anesthesia Type: General Level of consciousness: awake Pain management: pain level controlled Vital Signs Assessment: post-procedure vital signs reviewed and stable Respiratory status: spontaneous breathing, nonlabored ventilation and respiratory function stable Cardiovascular status: blood pressure returned to baseline and stable Postop Assessment: no apparent nausea or vomiting Anesthetic complications: no   No notable events documented.  Last Vitals:  Vitals:   06/20/23 0012 06/20/23 0321  BP: 127/77 105/68  Pulse: 68 (!) 51  Resp: 20 20  Temp: 36.5 C 36.4 C  SpO2: 98% 96%    Last Pain:  Vitals:   06/20/23 0334  TempSrc:   PainSc: Asleep                 Azalynn Maxim P Trinty Marken

## 2023-06-20 NOTE — Evaluation (Signed)
Occupational Therapy Evaluation and Discharge Patient Details Name: Jonathon Griffin MRN: [id number] DOB: 1956/03/04 Today's Date: 06/20/2023   History of Present Illness Pt is a 68 yo male s/p L4-5 lumbar interbody fusion via left transforaminal approach. PHMx: anterior cervical fusion 2011   Clinical Impression   This 68 yo male admitted and underwent above presents to acute OT with all education completed and  post op back surgery therapy handout provided. No further OT needs and no skilled PT needs identified (made PT aware). Acute OT will sign off without need for follow up .      If plan is discharge home, recommend the following: Assistance with cooking/housework;Assist for transportation    Functional Status Assessment  Patient has had a recent decline in their functional status and demonstrates the ability to make significant improvements in function in a reasonable and predictable amount of time. (no further OT needs identfied)  Equipment Recommendations  None recommended by OT       Precautions / Restrictions Precautions Precautions: Back Precaution Booklet Issued: Yes (comment) Required Braces or Orthoses:  (no brace needed per orders) Restrictions Weight Bearing Restrictions Per Provider Order: No      Mobility Bed Mobility Overal bed mobility: Modified Independent                  Transfers Overall transfer level: Modified independent Equipment used: None               General transfer comment: Pt went up and down a flight of steps using 2 rails (as he has at home) using step-to pattern at a Mod I level      Balance Overall balance assessment: Mild deficits observed, not formally tested                                         ADL either performed or assessed with clinical judgement   ADL Overall ADL's : Modified independent                                       General ADL Comments: Educated on sequence of  LBD to make it more efficient, use of wet wipes for back peri care, use of 2 cups for brushing teeth to avoid bending over the sink, building up sitting tolerance, using pillows in bed, sit<>stand stance that is easier to keep back straight and sit<>stand lower surfaces     Vision Baseline Vision/History: 1 Wears glasses Ability to See in Adequate Light: 0 Adequate Patient Visual Report: No change from baseline              Pertinent Vitals/Pain Pain Assessment Pain Assessment: 0-10 Pain Score: 8  Pain Location: back Pain Descriptors / Indicators: Sore, Aching Pain Intervention(s): Limited activity within patient's tolerance, Monitored during session, Premedicated before session     Extremity/Trunk Assessment Upper Extremity Assessment Upper Extremity Assessment: Overall WFL for tasks assessed           Communication Communication Communication: No apparent difficulties   Cognition Arousal: Alert Behavior During Therapy: WFL for tasks assessed/performed Overall Cognitive Status: Within Functional Limits for tasks assessed  Home Living Family/patient expects to be discharged to:: Private residence Living Arrangements: Spouse/significant other Available Help at Discharge: Family;Available 24 hours/day Type of Home: House Home Access: Stairs to enter [employer] of Steps: 4 Entrance Stairs-Rails: Right;Left;Can reach both Home Layout: One level     Bathroom Shower/Tub: Producer, television/film/video: Standard     Home Equipment: Shower seat;Hand held shower head          Prior Functioning/Environment Prior Level of Function : Independent/Modified Independent;Working/employed;Driving                        OT Problem List: Decreased range of motion;Pain         OT Goals(Current goals can be found in the care plan section) Acute Rehab OT Goals Patient Stated Goal:  to go home today and be able to do as much for myself as possible         AM-PAC OT "6 Clicks" Daily Activity     Outcome Measure Help from another person eating meals?: None Help from another person taking care of personal grooming?: None Help from another person toileting, which includes using toliet, bedpan, or urinal?: None Help from another person bathing (including washing, rinsing, drying)?: None Help from another person to put on and taking off regular upper body clothing?: None Help from another person to put on and taking off regular lower body clothing?: None 6 Click Score: 24   End of Session Nurse Communication:  (no OT needs)  Activity Tolerance: Patient tolerated treatment well Patient left: in chair;with call bell/phone within reach  OT Visit Diagnosis: Unsteadiness on feet (R26.81);Pain Pain - part of body:  (back)                Time: 1610-9604 OT Time Calculation (min): 30 min Charges:  OT General Charges $OT Visit: 1 Visit OT Evaluation $OT Eval Moderate Complexity: 1 Mod OT Treatments $Self Care/Home Management : 8-22 mins  Lindon Romp OT Acute Rehabilitation Services Office [phone]    Evette Georges 06/20/2023, 10:01 AM

## 2023-06-20 NOTE — Discharge Summary (Signed)
  Patient ID: DAIVEON MARKMAN MRN: [id number] DOB/AGE: 66-23-1957 66 y.o.  Admit date: 06/19/2023 Discharge date: 06/20/2023  Admission Diagnoses: Lumbar spinal stenosis [M48.061]   Discharge Diagnoses: Same   Discharged Condition: Stable  Hospital Course:  Jonathon Griffin is a 68 y.o. male who was admitted following an uncomplicated MIS TLIF L4-5. They were recovered in PACU and transferred to [facility]. Hospital course was uncomplicated. Pt stable for discharge today. Pt to f/u in office for routine post op visit. Pt is in agreement w/ plan.    Discharge Exam: Blood pressure 111/61, pulse 78, temperature 98 F (36.7 C), temperature source Oral, resp. rate 18, height 5\' 10"  (1.778 m), weight 97.5 kg, SpO2 94%.  Disposition: Discharge disposition: 01-Home or Self Care       Discharge Instructions     Incentive spirometry RT   Complete by: As directed       Allergies as of 06/20/2023       Reactions   Nsaids Other (See Comments)   Told to avoid with h/o MI   Oxycodone Itching   Wellbutrin [bupropion]    tremors        Medication List     PAUSE taking these medications    aspirin EC 81 MG tablet Wait to take this until: June 27, 2023 Take 1 tablet (81 mg total) by mouth daily.       STOP taking these medications    HYDROcodone-acetaminophen 10-325 MG tablet Commonly known as: NORCO Replaced by: HYDROcodone-acetaminophen 5-325 MG tablet       TAKE these medications    atorvastatin 80 MG tablet Commonly known as: LIPITOR TAKE 1 TABLET BY MOUTH EVERY DAY AT 6PM   docusate sodium 100 MG capsule Commonly known as: COLACE Take 1 capsule (100 mg total) by mouth 2 (two) times daily.   famotidine 20 MG tablet Commonly known as: PEPCID TAKE 1 TABLET BY MOUTH TWICE A DAY   HYDROcodone-acetaminophen 5-325 MG tablet Commonly known as: NORCO/VICODIN Take 1-2 tablets by mouth every 4 (four) hours as needed for severe pain (pain score 7-10). Replaces:  HYDROcodone-acetaminophen 10-325 MG tablet   lamoTRIgine 100 MG tablet Commonly known as: LAMICTAL TAKE 1 TABLET BY MOUTH EVERY DAY   methocarbamol 500 MG tablet Commonly known as: ROBAXIN Take 1 tablet (500 mg total) by mouth every 6 (six) hours as needed for muscle spasms.   metoprolol succinate 25 MG 24 hr tablet Commonly known as: TOPROL-XL TAKE 1 TABLET BY MOUTH EVERY DAY   mupirocin ointment 2 % Commonly known as: BACTROBAN Place 1 Application into the nose 2 (two) times daily for 60 doses. Use as directed 2 times daily for 5 days every other week for 6 weeks.   tadalafil 5 MG tablet Commonly known as: Cialis Take 1 tablet (5 mg total) by mouth daily. What changed:  when to take this reasons to take this   Vitamin D3 50 MCG (2000 UT) capsule Take 1 capsule (2,000 Units total) by mouth daily.   zolpidem 10 MG tablet Commonly known as: AMBIEN Take 1 tablet (10 mg total) by mouth at bedtime as needed. for sleep What changed:  how much to take when to take this additional instructions         Signed: Wyland Rastetter CAYLIN Jaydeen Darley 06/20/2023, 8:13 AM

## 2023-06-25 ENCOUNTER — Encounter (HOSPITAL_COMMUNITY): Payer: Self-pay | Admitting: Neurosurgery

## 2023-07-06 ENCOUNTER — Other Ambulatory Visit: Payer: Self-pay | Admitting: Cardiovascular Disease

## 2023-07-22 NOTE — Progress Notes (Unsigned)
 Cardiology Office Note:  .   Date:  07/23/2023  ID:  Jonathon Griffin, DOB Jul 20, 1955, MRN [id number] PCP: Tresa Garter, MD  [facility] Providers Cardiologist:  Reatha Harps, MD    History of Present Illness: .    Chief Complaint  Patient presents with   Follow-up         Jonathon Griffin is a 68 y.o. male with history of CAD, HTN, HLD who presents for follow-up.    History of Present Illness   Jonathon Griffin is a 68 year old male with a history of inferior STEMI and cardiac arrest who presents for follow-up.  His LDL cholesterol is currently at goal, with the most recent level recorded at 45 mg/dL. He is taking atorvastatin 80 mg daily. He also continues to take aspirin and metoprolol succinate 25 mg daily for hypertension, which is well controlled. No chest pain, shortness of breath, or palpitations.  He recently underwent a back operation, which he describes as having gone well, although the recovery process is challenging. He required primary and cardiac clearance prior to the surgery. He is not as active as he would like due to his recent back surgery but is able to walk a lot and perform activities around the house without issues.  He inquires about over-the-counter medications for coughs and colds, expressing concern about avoiding ibuprofen due to his stent.          Problem List 1. Cardiac Arrest -04/06/2019 ([facility]) -2/2 inferior STEMI s/p PCI to RCA -course c/b aspiration PNA, acute liver injury, resp failure requiring trach, protein deficiency requiring PEG 2. Multiple strokes (subcortical R occipital lobe/R cerebellar) -in setting of cardiac arrest  -negative monitor for Afib 08/25/2019 3. Hyperlipidemia  -T chol 116, TG 175, HDL 37, LDL 44    ROS: All other ROS reviewed and negative. Pertinent positives noted in the HPI.     Studies Reviewed: Marland Kitchen       TTE 05/28/2019  1. Left ventricular ejection fraction, by visual estimation, is 50  to  55%. The left ventricle has low normal function. There is no left  ventricular hypertrophy.   2. Basal inferoseptal segment, basal inferolateral segment, basal  inferior segment, and mid inferolateral segment are abnormal.   3. Left ventricular diastolic parameters are indeterminate.   4. The left ventricle demonstrates regional wall motion abnormalities.   5. Global right ventricle has low normal systolic function.The right  ventricular size is normal. No increase in right ventricular wall  thickness.   6. Left atrial size was normal.   7. Right atrial size was normal.   8. The mitral valve is grossly normal. Trivial mitral valve  regurgitation.   9. The tricuspid valve is grossly normal.  10. The aortic valve is tricuspid. Aortic valve regurgitation is trivial.  11. The pulmonic valve was grossly normal. Pulmonic valve regurgitation is  trivial.  12. TR signal is inadequate for assessing pulmonary artery systolic  pressure.  13. The inferior vena cava is normal in size with greater than 50%  respiratory variability, suggesting right atrial pressure of 3 mmHg.  Physical Exam:   VS:  BP 133/82   Pulse 68   Ht 5\' 10"  (1.778 m)   Wt 211 lb 6.4 oz (95.9 kg)   SpO2 95%   BMI 30.33 kg/m    Wt Readings from Last 3 Encounters:  07/23/23 211 lb 6.4 oz (95.9 kg)  06/19/23 215 lb (97.5 kg)  06/13/23 215 lb 14.4 oz (97.9 kg)    GEN: Well nourished, well developed in no acute distress NECK: No JVD; No carotid bruits CARDIAC: RRR, no murmurs, rubs, gallops RESPIRATORY:  Clear to auscultation without rales, wheezing or rhonchi  ABDOMEN: Soft, non-tender, non-distended EXTREMITIES:  No edema; No deformity  ASSESSMENT AND PLAN: .   Assessment and Plan    CAD Inferior STEMI Cardiac Arrest in 2020 Stable, no angina symptoms. LDL at goal on Atorvastatin 80mg  daily. -Continue Aspirin and Atorvastatin indefinitely.  Hypertension Well controlled on Metoprolol succinate 25mg   daily. -Continue Metoprolol succinate 25mg  daily.  General Health Maintenance -Annual follow-up unless changes occur.              Follow-up: Return in about 1 year (around 07/22/2024).   Signed, Lenna Gilford. Flora Lipps, MD, [facility]  [facility]  [address], [address] [place], [place] [address] [phone]  5:22 PM

## 2023-07-23 ENCOUNTER — Ambulatory Visit: Payer: Managed Care, Other (non HMO) | Attending: Cardiovascular Disease | Admitting: Cardiovascular Disease

## 2023-07-23 ENCOUNTER — Encounter: Payer: Self-pay | Admitting: Cardiovascular Disease

## 2023-07-23 VITALS — BP 133/82 | HR 68 | Ht 70.0 in | Wt 211.4 lb

## 2023-07-23 DIAGNOSIS — I2510 Atherosclerotic heart disease of native coronary artery without angina pectoris: Secondary | ICD-10-CM | POA: Diagnosis not present

## 2023-07-23 DIAGNOSIS — E782 Mixed hyperlipidemia: Secondary | ICD-10-CM | POA: Diagnosis not present

## 2023-07-23 DIAGNOSIS — I10 Essential (primary) hypertension: Secondary | ICD-10-CM

## 2023-07-23 NOTE — Patient Instructions (Signed)
 Medication Instructions:  Your physician recommends that you continue on your current medications as directed. Please refer to the Current Medication list given to you today.    *If you need a refill on your cardiac medications before your next appointment, please call your pharmacy*   Lab Work: None    If you have labs (blood work) drawn today and your tests are completely normal, you will receive your results only by: MyChart Message (if you have MyChart) OR A paper copy in the mail If you have any lab test that is abnormal or we need to change your treatment, we will call you to review the results.   Testing/Procedures: none   Follow-Up: At [facility], you and your health needs are our priority.  As part of our continuing mission to provide you with exceptional heart care, we have created designated Provider Care Teams.  These Care Teams include your primary Cardiologist (physician) and Advanced Practice Providers (APPs -  Physician Assistants and Nurse Practitioners) who all work together to provide you with the care you need, when you need it.  We recommend signing up for the patient portal called "MyChart".  Sign up information is provided on this After Visit Summary.  MyChart is used to connect with patients for Virtual Visits (Telemedicine).  Patients are able to view lab/test results, encounter notes, upcoming appointments, etc.  Non-urgent messages can be sent to your provider as well.   To learn more about what you can do with MyChart, go to [email or web address].    Your next appointment:   1 year(s)  The format for your next appointment:   In Person  Provider:   Edd Fabian, FNP, Micah Flesher, PA-C, Marjie Skiff, PA-C, Robet Leu, PA-C, Juanda Crumble, PA-C, Joni Reining, DNP, ANP, Azalee Course, PA-C, Bernadene Person, NP, or Reather Littler, NP       Other Instructions

## 2023-08-14 ENCOUNTER — Telehealth: Payer: Self-pay | Admitting: Internal Medicine

## 2023-08-14 NOTE — Telephone Encounter (Unsigned)
 Copied from CRM [id number]. Topic: Clinical - Medication Question >> Aug 14, 2023  9:49 AM Alcus Dad wrote: Reason for CRM: Patient is going back to work and wants Dr. Posey Rea to put him back on Tramadol.

## 2023-08-16 NOTE — Telephone Encounter (Signed)
 Copied from CRM [id number]. Topic: Clinical - Medication Question >> Aug 16, 2023 10:11 AM Adaysia C wrote: Reason for CRM: Patient is requesting for the provider to prescribe him tramadol again because he is about to start working again; preferred pharmacy: [id number] [place], [place] - [address] [facility] [address] [address] [place] [address] Phone: [phone]  Fax: [phone]  Please follow up with patient #[phone]

## 2023-08-23 ENCOUNTER — Other Ambulatory Visit: Payer: Self-pay | Admitting: Cardiovascular Disease

## 2023-08-23 ENCOUNTER — Other Ambulatory Visit: Payer: Self-pay | Admitting: Internal Medicine

## 2023-08-23 MED ORDER — TRAMADOL HCL 50 MG PO TABS: 50.0000 mg | ORAL_TABLET | Freq: Four times a day (QID) | ORAL | 2 refills | Status: DC | PRN

## 2023-08-23 NOTE — Telephone Encounter (Signed)
 Okay.  Schedule office visit please

## 2023-08-23 NOTE — Telephone Encounter (Signed)
 Called and informed patient of potential follow up. Patient informed me that they weren't sure when they could make it back in due to just starting new job. Patient also noted that the Norco had not been refilled in a while and that's why he was requesting the tramadol. Patient also informed me that tramadol had already seemed to be filled. FYI

## 2023-09-03 ENCOUNTER — Other Ambulatory Visit: Payer: Self-pay | Admitting: Internal Medicine

## 2023-09-03 MED ORDER — ZOLPIDEM TARTRATE 10 MG PO TABS: 10.0000 mg | ORAL_TABLET | Freq: Every evening | ORAL | 0 refills | Status: DC | PRN

## 2023-09-03 NOTE — Telephone Encounter (Signed)
PCP out of office.

## 2023-09-03 NOTE — Telephone Encounter (Signed)
 Copied from CRM [id number]. Topic: Clinical - Medication Refill >> Sep 03, 2023 10:04 AM Artemio Larry wrote: Most Recent Primary Care Visit:  Provider: Genia Kettering  Department: [facility]  Visit Type: OFFICE VISIT  Date: 06/04/2023  Medication: zolpidem   Has the patient contacted their pharmacy? Yes (Agent: If no, request that the patient contact the pharmacy for the refill. If patient does not wish to contact the pharmacy document the reason why and proceed with request.) (Agent: If yes, when and what did the pharmacy advise?)  Is this the correct pharmacy for this prescription? Yes If no, delete pharmacy and type the correct one.  This is the patient's preferred pharmacy:   [id number] [place], [place] - [address] [facility] [address] [facility] [address] [place] [address] Phone: [phone] Fax: [phone]   Has the prescription been filled recently? Yes  Is the patient out of the medication? No  Has the patient been seen for an appointment in the last year OR does the patient have an upcoming appointment? Yes  Can we respond through MyChart? Yes  Agent: Please be advised that Rx refills may take up to 3 business days. We ask that you follow-up with your pharmacy.

## 2023-10-08 ENCOUNTER — Other Ambulatory Visit: Payer: Self-pay | Admitting: Internal Medicine

## 2023-10-08 NOTE — Telephone Encounter (Signed)
 Copied from CRM [id number]. Topic: Clinical - Medication Refill >> Oct 08, 2023 11:09 AM Albertha Alosa wrote: Medication: zolpidem  (AMBIEN ) 10 MG tablet  Has the patient contacted their pharmacy? Yes (Agent: If no, request that the patient contact the pharmacy for the refill. If patient does not wish to contact the pharmacy document the reason why and proceed with request.) (Agent: If yes, when and what did the pharmacy advise?)  This is the patient's preferred pharmacy:  [id number] [place], [place] - [address] [facility] [address] [place] [address] Phone: [phone] Fax: [phone]  Is this the correct pharmacy for this prescription? Yes If no, delete pharmacy and type the correct one.   Has the prescription been filled recently? No  Is the patient out of the medication? Yes  Has the patient been seen for an appointment in the last year OR does the patient have an upcoming appointment? Yes  Can we respond through MyChart? Yes  Agent: Please be advised that Rx refills may take up to 3 business days. We ask that you follow-up with your pharmacy.

## 2023-10-08 NOTE — Telephone Encounter (Signed)
 Last Fill: 09/03/23 30 tabs/0 RF  Last OV: 06/04/23 Next OV: None Scheduled  Routing to provider for review/authorization.

## 2023-10-09 MED ORDER — ZOLPIDEM TARTRATE 10 MG PO TABS
10.0000 mg | ORAL_TABLET | Freq: Every evening | ORAL | 0 refills | Status: DC | PRN
Start: 2023-10-09 — End: 2023-11-28

## 2023-11-26 ENCOUNTER — Telehealth: Payer: Self-pay | Admitting: Internal Medicine

## 2023-11-26 NOTE — Telephone Encounter (Unsigned)
 Copied from CRM [id number]. Topic: Clinical - Medication Refill >> Nov 26, 2023 10:43 AM Chasity T wrote: Medication: zolpidem  (AMBIEN ) 10 MG tablet   Has the patient contacted their pharmacy? Yes   This is the patient's preferred pharmacy:  [id number] [place], [place] - [address] [facility] [address] [facility] [address] [place] [address] Phone: [phone] Fax: [phone]  Is this the correct pharmacy for this prescription? Yes If no, delete pharmacy and type the correct one.   Has the prescription been filled recently? No  Is the patient out of the medication? Yes  Has the patient been seen for an appointment in the last year OR does the patient have an upcoming appointment? Yes  Can we respond through MyChart? Yes  Agent: Please be advised that Rx refills may take up to 3 business days. We ask that you follow-up with your pharmacy.

## 2023-11-28 ENCOUNTER — Ambulatory Visit: Admitting: Internal Medicine

## 2023-11-28 ENCOUNTER — Encounter: Payer: Self-pay | Admitting: Internal Medicine

## 2023-11-28 VITALS — BP 110/78 | HR 43 | Temp 98.7°F | Ht 70.0 in | Wt 208.0 lb

## 2023-11-28 DIAGNOSIS — E785 Hyperlipidemia, unspecified: Secondary | ICD-10-CM | POA: Diagnosis not present

## 2023-11-28 DIAGNOSIS — I10 Essential (primary) hypertension: Secondary | ICD-10-CM

## 2023-11-28 DIAGNOSIS — M180 Bilateral primary osteoarthritis of first carpometacarpal joints: Secondary | ICD-10-CM

## 2023-11-28 DIAGNOSIS — G4700 Insomnia, unspecified: Secondary | ICD-10-CM

## 2023-11-28 MED ORDER — ZOLPIDEM TARTRATE 10 MG PO TABS: 10.0000 mg | ORAL_TABLET | Freq: Every evening | ORAL | 1 refills | Status: DC | PRN

## 2023-11-28 MED ORDER — TRAMADOL HCL 50 MG PO TABS: 50.0000 mg | ORAL_TABLET | Freq: Four times a day (QID) | ORAL | 2 refills | Status: DC | PRN

## 2023-11-28 NOTE — Progress Notes (Signed)
 Subjective:  Patient ID: Jonathon Griffin, male    DOB: 04-Feb-1956  Age: 68 y.o. MRN: [id number]  CC: Medical Management of Chronic Issues (6 mnth f/u)   HPI [name] presents for LBP, insomnia, dyslipidemia  Outpatient Medications Prior to Visit  Medication Sig Dispense Refill   aspirin  EC 81 MG tablet Take 1 tablet (81 mg total) by mouth daily. 90 tablet 3   atorvastatin  (LIPITOR) 80 MG tablet TAKE 1 TABLET BY MOUTH EVERY DAY AT 6PM 90 tablet 3   Cholecalciferol (VITAMIN D3) 50 MCG (2000 UT) capsule Take 1 capsule (2,000 Units total) by mouth daily. 100 capsule 3   famotidine  (PEPCID ) 20 MG tablet TAKE 1 TABLET BY MOUTH TWICE A DAY 180 tablet 3   lamoTRIgine  (LAMICTAL ) 100 MG tablet TAKE 1 TABLET BY MOUTH EVERY DAY 90 tablet 1   methocarbamol  (ROBAXIN ) 500 MG tablet Take 1 tablet (500 mg total) by mouth every 6 (six) hours as needed for muscle spasms. 120 tablet 0   metoprolol  succinate (TOPROL -XL) 25 MG 24 hr tablet TAKE 1 TABLET BY MOUTH EVERY DAY 90 tablet 3   traMADol  (ULTRAM ) 50 MG tablet Take 1 tablet (50 mg total) by mouth every 6 (six) hours as needed for severe pain (pain score 7-10). 120 tablet 2   zolpidem  (AMBIEN ) 10 MG tablet Take 1 tablet (10 mg total) by mouth at bedtime as needed. for sleep 30 tablet 0   docusate sodium  (COLACE) 100 MG capsule Take 1 capsule (100 mg total) by mouth 2 (two) times daily. 10 capsule 0   tadalafil  (CIALIS ) 5 MG tablet Take 1 tablet (5 mg total) by mouth daily. (Patient taking differently: Take 5 mg by mouth daily as needed (ed).) 90 tablet 3   No facility-administered medications prior to visit.    ROS: Review of Systems  Constitutional:  Negative for appetite change, fatigue and unexpected weight change.  HENT:  Negative for congestion, nosebleeds, sneezing, sore throat and trouble swallowing.   Eyes:  Negative for itching and visual disturbance.  Respiratory:  Negative for cough.   Cardiovascular:  Negative for chest pain,  palpitations and leg swelling.  Gastrointestinal:  Negative for abdominal distention, blood in stool, diarrhea and nausea.  Genitourinary:  Negative for frequency and hematuria.  Musculoskeletal:  Positive for gait problem. Negative for back pain, joint swelling and neck pain.  Skin:  Negative for rash.  Neurological:  Negative for dizziness, tremors, speech difficulty and weakness.  Psychiatric/Behavioral:  Negative for agitation, dysphoric mood, sleep disturbance and suicidal ideas. The patient is not nervous/anxious.     Objective:  BP 110/78   Pulse (!) 43   Temp 98.7 F (37.1 C) (Oral)   Ht 5' 10 (1.778 m)   Wt 208 lb (94.3 kg)   SpO2 97%   BMI 29.84 kg/m   BP Readings from Last 3 Encounters:  11/28/23 110/78  07/23/23 133/82  06/20/23 111/61    Wt Readings from Last 3 Encounters:  11/28/23 208 lb (94.3 kg)  07/23/23 211 lb 6.4 oz (95.9 kg)  06/19/23 215 lb (97.5 kg)    Physical Exam Constitutional:      General: He is not in acute distress.    Appearance: He is well-developed. He is obese.     Comments: NAD  Eyes:     Conjunctiva/sclera: Conjunctivae normal.     Pupils: Pupils are equal, round, and reactive to light.  Neck:     Thyroid : No thyromegaly.  Vascular: No JVD.  Cardiovascular:     Rate and Rhythm: Normal rate and regular rhythm.     Heart sounds: Normal heart sounds. No murmur heard.    No friction rub. No gallop.  Pulmonary:     Effort: Pulmonary effort is normal. No respiratory distress.     Breath sounds: Normal breath sounds. No wheezing or rales.  Chest:     Chest wall: No tenderness.  Abdominal:     General: Bowel sounds are normal. There is no distension.     Palpations: Abdomen is soft. There is no mass.     Tenderness: There is no abdominal tenderness. There is no guarding or rebound.  Musculoskeletal:        General: Tenderness present. Normal range of motion.     Cervical back: Normal range of motion.     Right lower leg: No  edema.     Left lower leg: No edema.  Lymphadenopathy:     Cervical: No cervical adenopathy.  Skin:    General: Skin is warm and dry.     Findings: No rash.  Neurological:     Mental Status: He is alert and oriented to person, place, and time.     Cranial Nerves: No cranial nerve deficit.     Motor: No abnormal muscle tone.     Coordination: Coordination normal.     Gait: Gait abnormal.     Deep Tendon Reflexes: Reflexes are normal and symmetric.  Psychiatric:        Behavior: Behavior normal.        Thought Content: Thought content normal.        Judgment: Judgment normal.    Lumbar spine with pain on range of motion  Lab Results  Component Value Date   WBC 8.0 06/13/2023   HGB 13.4 06/13/2023   HCT 40.2 06/13/2023   PLT 188 06/13/2023   GLUCOSE 110 (H) 06/13/2023   CHOL 116 08/28/2022   TRIG 175.0 (H) 08/28/2022   HDL 37.00 (L) 08/28/2022   LDLDIRECT 51.0 03/24/2020   LDLCALC 44 08/28/2022   ALT 25 08/28/2022   AST 20 08/28/2022   NA 138 06/13/2023   K 4.3 06/13/2023   CL 104 06/13/2023   CREATININE 1.21 06/13/2023   BUN 18 06/13/2023   CO2 24 06/13/2023   TSH 2.52 08/28/2022   PSA 0.53 08/28/2022   INR 2.3 (H) 05/14/2019   HGBA1C 6.3 08/28/2022    DG Lumbar Spine 2-3 Views Result Date: 06/19/2023 CLINICAL DATA:  Elective surgery. EXAM: LUMBAR SPINE - 2-3 VIEW COMPARISON:  Radiograph 04/02/2023 FINDINGS: Two fluoroscopic spot views of the lumbar spine in the operating room. Posterior rod with pedicle screw fusion at L4-L5 with interbody spacer. Fluoroscopy time 1.7 seconds. Dose 1.24 mGy. IMPRESSION: Procedural fluoroscopy during lumbar surgery. Electronically Signed   By: Andrea Gasman M.D.   On: 06/19/2023 16:41   DG C-Arm 1-60 Min-No Report Result Date: 06/19/2023 Fluoroscopy was utilized by the requesting physician.  No radiographic interpretation.   DG O-ARM IMAGE ONLY/NO REPORT Result Date: 06/19/2023 There is no Radiologist interpretation  for this  exam.   Assessment & Plan:   Problem List Items Addressed This Visit     Hyperlipidemia   On Lipitor      Insomnia   Continue with zolpidem  prn  Potential benefits of a long term benzodiazepines  use as well as potential risks  and complications were explained to the patient and were aknowledged.  Essential hypertension    On Metoprolol  Monitor BP      Osteoarthritis - Primary   Tramadol  prn renewed  Potential benefits of a long term opioids use as well as potential risks (i.e. addiction risk, apnea etc) and complications (i.e. Somnolence, constipation and others) were explained to the patient and were aknowledged.      Relevant Medications   traMADol  (ULTRAM ) 50 MG tablet      Meds ordered this encounter  Medications   zolpidem  (AMBIEN ) 10 MG tablet    Sig: Take 1 tablet (10 mg total) by mouth at bedtime as needed. for sleep    Dispense:  90 tablet    Refill:  1   traMADol  (ULTRAM ) 50 MG tablet    Sig: Take 1 tablet (50 mg total) by mouth every 6 (six) hours as needed for severe pain (pain score 7-10).    Dispense:  120 tablet    Refill:  2      Follow-up: Return in about 4 months (around 03/30/2024) for Wellness Exam.  Marolyn Noel, MD

## 2023-11-28 NOTE — Patient Instructions (Signed)
 USEFUL THINGS FOR ARTHRITIS and musculoskeletal pains:    A "rice sock heating pad" refers to a homemade heating pad created by filling a sock with uncooked rice, which can be heated in a microwave to provide a warm compress for sore muscles, pain relief, or other applications; essentially, it's a simple way to generate heat using readily available materials.  Key points about rice sock heat: How to make it: Fill a clean sock (preferably a tube sock) about 2/3 full with uncooked rice, tie a knot at the top to secure the rice inside.  Heating it up: Place the rice sock in the microwave and heat in short intervals (usually around 30 seconds at a time) until it reaches the desired warmth.  Important considerations: Check temperature before applying: Always test the temperature of the rice sock before applying it to your skin to avoid burns.  Use a towel to protect skin: Wrap the rice sock in a thin towel to distribute the heat evenly and protect your skin.  Uses: Muscle aches and pains  Menstrual cramps  Neck pain  Arthritis discomfort      BLUE EMU CREAM: Use it 2-3 times a day on painful areas

## 2023-12-02 NOTE — Assessment & Plan Note (Signed)
 On Lipitor

## 2023-12-02 NOTE — Assessment & Plan Note (Signed)
On Metoprolol Monitor BP

## 2023-12-02 NOTE — Assessment & Plan Note (Signed)
Continue with zolpidem prn  Potential benefits of a long term benzodiazepines  use as well as potential risks  and complications were explained to the patient and were aknowledged.  

## 2023-12-02 NOTE — Assessment & Plan Note (Signed)
Tramadol prn renewed  Potential benefits of a long term opioids use as well as potential risks (i.e. addiction risk, apnea etc) and complications (i.e. Somnolence, constipation and others) were explained to the patient and were aknowledged.

## 2024-02-14 ENCOUNTER — Other Ambulatory Visit: Payer: Self-pay | Admitting: Cardiovascular Disease

## 2024-02-28 ENCOUNTER — Ambulatory Visit (INDEPENDENT_AMBULATORY_CARE_PROVIDER_SITE_OTHER): Admitting: Internal Medicine

## 2024-02-28 ENCOUNTER — Encounter: Payer: Self-pay | Admitting: Internal Medicine

## 2024-02-28 VITALS — BP 110/86 | HR 52 | Temp 98.6°F | Ht 70.0 in | Wt 205.8 lb

## 2024-02-28 DIAGNOSIS — Z0000 Encounter for general adult medical examination without abnormal findings: Secondary | ICD-10-CM | POA: Diagnosis not present

## 2024-02-28 DIAGNOSIS — G4700 Insomnia, unspecified: Secondary | ICD-10-CM

## 2024-02-28 DIAGNOSIS — I2510 Atherosclerotic heart disease of native coronary artery without angina pectoris: Secondary | ICD-10-CM

## 2024-02-28 DIAGNOSIS — I10 Essential (primary) hypertension: Secondary | ICD-10-CM | POA: Diagnosis not present

## 2024-02-28 DIAGNOSIS — M180 Bilateral primary osteoarthritis of first carpometacarpal joints: Secondary | ICD-10-CM

## 2024-02-28 DIAGNOSIS — Z1211 Encounter for screening for malignant neoplasm of colon: Secondary | ICD-10-CM

## 2024-02-28 DIAGNOSIS — M5442 Lumbago with sciatica, left side: Secondary | ICD-10-CM

## 2024-02-28 DIAGNOSIS — I2583 Coronary atherosclerosis due to lipid rich plaque: Secondary | ICD-10-CM

## 2024-02-28 DIAGNOSIS — K604 Rectal fistula, unspecified: Secondary | ICD-10-CM

## 2024-02-28 LAB — LIPID PANEL
Cholesterol: 112 mg/dL (ref 0–200)
HDL: 41 mg/dL (ref >39.00)
LDL Cholesterol: 46 mg/dL (ref 0–99)
NonHDL: 71.41
Total CHOL/HDL Ratio: 3
Triglycerides: 126 mg/dL (ref 0.0–149.0)
VLDL: 25.2 mg/dL (ref 0.0–40.0)

## 2024-02-28 LAB — URINALYSIS
Bilirubin Urine: NEGATIVE
Hgb urine dipstick: NEGATIVE
Ketones, ur: NEGATIVE
Leukocytes,Ua: NEGATIVE
Nitrite: NEGATIVE
Specific Gravity, Urine: 1.01 (ref 1.000–1.030)
Total Protein, Urine: NEGATIVE
Urine Glucose: NEGATIVE
Urobilinogen, UA: 0.2 (ref 0.0–1.0)
pH: 6 (ref 5.0–8.0)

## 2024-02-28 LAB — CBC WITH DIFFERENTIAL/PLATELET
Basophils Absolute: 0 K/uL (ref 0.0–0.1)
Basophils Relative: 0.6 % (ref 0.0–3.0)
Eosinophils Absolute: 0.1 K/uL (ref 0.0–0.7)
Eosinophils Relative: 1.7 % (ref 0.0–5.0)
HCT: 42.6 % (ref 39.0–52.0)
Hemoglobin: 13.8 g/dL (ref 13.0–17.0)
Lymphocytes Relative: 26.7 % (ref 12.0–46.0)
Lymphs Abs: 1.6 K/uL (ref 0.7–4.0)
MCHC: 32.5 g/dL (ref 30.0–36.0)
MCV: 91.5 fl (ref 78.0–100.0)
Monocytes Absolute: 0.5 K/uL (ref 0.1–1.0)
Monocytes Relative: 8 % (ref 3.0–12.0)
Neutro Abs: 3.9 K/uL (ref 1.4–7.7)
Neutrophils Relative %: 63 % (ref 43.0–77.0)
Platelets: 175 K/uL (ref 150.0–400.0)
RBC: 4.66 Mil/uL (ref 4.22–5.81)
RDW: 15.2 % (ref 11.5–15.5)
WBC: 6.1 K/uL (ref 4.0–10.5)

## 2024-02-28 LAB — COMPREHENSIVE METABOLIC PANEL WITH GFR
ALT: 24 U/L (ref 0–53)
AST: 19 U/L (ref 0–37)
Albumin: 4.5 g/dL (ref 3.5–5.2)
Alkaline Phosphatase: 60 U/L (ref 39–117)
BUN: 17 mg/dL (ref 6–23)
CO2: 29 meq/L (ref 19–32)
Calcium: 9.6 mg/dL (ref 8.4–10.5)
Chloride: 105 meq/L (ref 96–112)
Creatinine, Ser: 1.18 mg/dL (ref 0.40–1.50)
GFR: 64.31 mL/min (ref >60.00)
Glucose, Bld: 109 mg/dL — ABNORMAL HIGH (ref 70–99)
Potassium: 4.6 meq/L (ref 3.5–5.1)
Sodium: 141 meq/L (ref 135–145)
Total Bilirubin: 0.7 mg/dL (ref 0.2–1.2)
Total Protein: 6.8 g/dL (ref 6.0–8.3)

## 2024-02-28 LAB — TSH: TSH: 1.31 u[IU]/mL (ref 0.35–5.50)

## 2024-02-28 LAB — PSA: PSA: 0.65 ng/mL (ref 0.10–4.00)

## 2024-02-28 NOTE — Progress Notes (Signed)
 Subjective:  Patient ID: Jonathon Griffin, male    DOB: 09-20-1955  Age: 68 y.o. MRN: [id number]  CC: Annual Exam   HPI CORAN DIPAOLA presents for a well exam F/u OA  Outpatient Medications Prior to Visit  Medication Sig Dispense Refill   aspirin  EC 81 MG tablet Take 1 tablet (81 mg total) by mouth daily. 90 tablet 3   atorvastatin  (LIPITOR) 80 MG tablet TAKE 1 TABLET BY MOUTH EVERY DAY AT 6PM 90 tablet 3   Cholecalciferol (VITAMIN D3) 50 MCG (2000 UT) capsule Take 1 capsule (2,000 Units total) by mouth daily. 100 capsule 3   docusate sodium  (COLACE) 100 MG capsule Take 1 capsule (100 mg total) by mouth 2 (two) times daily. 10 capsule 0   famotidine  (PEPCID ) 20 MG tablet TAKE 1 TABLET BY MOUTH TWICE A DAY 180 tablet 1   lamoTRIgine  (LAMICTAL ) 100 MG tablet TAKE 1 TABLET BY MOUTH EVERY DAY 90 tablet 1   methocarbamol  (ROBAXIN ) 500 MG tablet Take 1 tablet (500 mg total) by mouth every 6 (six) hours as needed for muscle spasms. 120 tablet 0   metoprolol  succinate (TOPROL -XL) 25 MG 24 hr tablet TAKE 1 TABLET BY MOUTH EVERY DAY 90 tablet 3   tadalafil  (CIALIS ) 5 MG tablet Take 1 tablet (5 mg total) by mouth daily. (Patient taking differently: Take 5 mg by mouth daily as needed (ed).) 90 tablet 3   traMADol  (ULTRAM ) 50 MG tablet Take 1 tablet (50 mg total) by mouth every 6 (six) hours as needed for severe pain (pain score 7-10). 120 tablet 2   zolpidem  (AMBIEN ) 10 MG tablet Take 1 tablet (10 mg total) by mouth at bedtime as needed. for sleep 90 tablet 1   No facility-administered medications prior to visit.    ROS: Review of Systems  Constitutional:  Negative for appetite change, fatigue and unexpected weight change.  HENT:  Negative for congestion, nosebleeds, sneezing, sore throat and trouble swallowing.   Eyes:  Negative for itching and visual disturbance.  Respiratory:  Negative for cough.   Cardiovascular:  Negative for chest pain, palpitations and leg swelling.  Gastrointestinal:   Negative for abdominal distention, blood in stool, diarrhea and nausea.  Genitourinary:  Negative for frequency and hematuria.  Musculoskeletal:  Positive for arthralgias, back pain and gait problem. Negative for joint swelling and neck pain.  Skin:  Negative for rash.  Neurological:  Negative for dizziness, tremors, speech difficulty and weakness.  Hematological:  Does not bruise/bleed easily.  Psychiatric/Behavioral:  Negative for agitation, dysphoric mood, sleep disturbance and suicidal ideas. The patient is not nervous/anxious.     Objective:  BP 110/86   Pulse (!) 52   Temp 98.6 F (37 C) (Oral)   Ht 5' 10 (1.778 m)   Wt 205 lb 12.8 oz (93.4 kg)   SpO2 97%   BMI 29.53 kg/m   BP Readings from Last 3 Encounters:  02/28/24 110/86  11/28/23 110/78  07/23/23 133/82    Wt Readings from Last 3 Encounters:  02/28/24 205 lb 12.8 oz (93.4 kg)  11/28/23 208 lb (94.3 kg)  07/23/23 211 lb 6.4 oz (95.9 kg)    Physical Exam Constitutional:      General: He is not in acute distress.    Appearance: Normal appearance. He is well-developed. He is obese.     Comments: NAD  Eyes:     Conjunctiva/sclera: Conjunctivae normal.     Pupils: Pupils are equal, round, and reactive to light.  Neck:     Thyroid : No thyromegaly.     Vascular: No JVD.  Cardiovascular:     Rate and Rhythm: Normal rate and regular rhythm.     Heart sounds: Normal heart sounds. No murmur heard.    No friction rub. No gallop.  Pulmonary:     Effort: Pulmonary effort is normal. No respiratory distress.     Breath sounds: Normal breath sounds. No wheezing or rales.  Chest:     Chest wall: No tenderness.  Abdominal:     General: Bowel sounds are normal. There is no distension.     Palpations: Abdomen is soft. There is no mass.     Tenderness: There is no abdominal tenderness. There is no guarding or rebound.  Musculoskeletal:        General: No tenderness. Normal range of motion.     Cervical back: Normal  range of motion.     Right lower leg: No edema.     Left lower leg: No edema.  Lymphadenopathy:     Cervical: No cervical adenopathy.  Skin:    General: Skin is warm and dry.     Findings: No rash.  Neurological:     Mental Status: He is alert and oriented to person, place, and time.     Cranial Nerves: No cranial nerve deficit.     Motor: No abnormal muscle tone.     Coordination: Coordination normal.     Gait: Gait normal.     Deep Tendon Reflexes: Reflexes are normal and symmetric.  Psychiatric:        Behavior: Behavior normal.        Thought Content: Thought content normal.        Judgment: Judgment normal.     Lab Results  Component Value Date   WBC 8.0 06/13/2023   HGB 13.4 06/13/2023   HCT 40.2 06/13/2023   PLT 188 06/13/2023   GLUCOSE 110 (H) 06/13/2023   CHOL 116 08/28/2022   TRIG 175.0 (H) 08/28/2022   HDL 37.00 (L) 08/28/2022   LDLDIRECT 51.0 03/24/2020   LDLCALC 44 08/28/2022   ALT 25 08/28/2022   AST 20 08/28/2022   NA 138 06/13/2023   K 4.3 06/13/2023   CL 104 06/13/2023   CREATININE 1.21 06/13/2023   BUN 18 06/13/2023   CO2 24 06/13/2023   TSH 2.52 08/28/2022   PSA 0.53 08/28/2022   INR 2.3 (H) 05/14/2019   HGBA1C 6.3 08/28/2022    DG Lumbar Spine 2-3 Views Result Date: 06/19/2023 CLINICAL DATA:  Elective surgery. EXAM: LUMBAR SPINE - 2-3 VIEW COMPARISON:  Radiograph 04/02/2023 FINDINGS: Two fluoroscopic spot views of the lumbar spine in the operating room. Posterior rod with pedicle screw fusion at L4-L5 with interbody spacer. Fluoroscopy time 1.7 seconds. Dose 1.24 mGy. IMPRESSION: Procedural fluoroscopy during lumbar surgery. Electronically Signed   By: Andrea Gasman M.D.   On: 06/19/2023 16:41   DG C-Arm 1-60 Min-No Report Result Date: 06/19/2023 Fluoroscopy was utilized by the requesting physician.  No radiographic interpretation.   DG O-ARM IMAGE ONLY/NO REPORT Result Date: 06/19/2023 There is no Radiologist interpretation  for this  exam.   Assessment & Plan:   Problem List Items Addressed This Visit     Coronary artery disease   On Lipitor, aspirin , metoprolol        Essential hypertension   On Metoprolol  Monitor BP      Insomnia   Continue with zolpidem  prn  Potential benefits of a long term  benzodiazepines  use as well as potential risks  and complications were explained to the patient and were aknowledged.      Low back pain   Severe LBP x weeks - LLE pain MRI w/severe L3-4, L4-5 nerve encroachment. Seeing [facility] [place] [facility], pt had a steroid shot B. He was treated w/steroids, Norco.  Tramadol  is not working for his LBP. Pain is 8/10. It was 10/10....  D/c Tramadol  Start Norco instead until he gets to the Pain clinic via  [facility] [place] [facility] LBP. Worse w/walking and standing. Sitting down is ok 12/2022 Norco works better, however the pain is severe without meds.  Tramadol  was not helping with this new kind of back pain. On epidural shots and in PT. One shot is due tomorrow  Surgery is planned - 06/19/2023 Norco renewed Norco works better, however the pain is severe without meds.  Tramadol  was not helping with this new kind of back pain. On epidural shots and in PT. One shot is due tomorrow        Osteoarthritis   Tramadol  prn renewed  Potential benefits of a long term opioids use as well as potential risks (i.e. addiction risk, apnea etc) and complications (i.e. Somnolence, constipation and others) were explained to the patient and were aknowledged.      Rectal fistula   No relapse      Well adult exam - Primary   We discussed age appropriate health related issues, including available/recomended screening tests and vaccinations. We discussed a need for adhering to healthy diet and exercise. Labs were ordered to be later reviewed . All questions were answered. CT calcium  score test offered  Colon due 2023      Relevant Orders   TSH   Urinalysis   CBC with  Differential/Platelet   Lipid panel   PSA   Comprehensive metabolic panel with GFR   Other Visit Diagnoses       Screening for colon cancer       Relevant Orders   Cologuard         No orders of the defined types were placed in this encounter.     Follow-up: Return in about 3 months (around 05/30/2024) for a follow-up visit.  Marolyn Noel, MD

## 2024-02-28 NOTE — Assessment & Plan Note (Signed)
 Severe LBP x weeks - LLE pain MRI w/severe L3-4, L4-5 nerve encroachment. Seeing [facility] [place] [facility], pt had a steroid shot B. He was treated w/steroids, Norco.  Tramadol  is not working for his LBP. Pain is 8/10. It was 10/10....  D/c Tramadol  Start Norco instead until he gets to the Pain clinic via  [facility] [place] [facility] LBP. Worse w/walking and standing. Sitting down is ok 12/2022 Norco works better, however the pain is severe without meds.  Tramadol  was not helping with this new kind of back pain. On epidural shots and in PT. One shot is due tomorrow  Surgery is planned - 06/19/2023 Norco renewed Norco works better, however the pain is severe without meds.  Tramadol  was not helping with this new kind of back pain. On epidural shots and in PT. One shot is due tomorrow

## 2024-02-28 NOTE — Assessment & Plan Note (Signed)
We discussed age appropriate health related issues, including available/recomended screening tests and vaccinations. We discussed a need for adhering to healthy diet and exercise. Labs were ordered to be later reviewed . All questions were answered. CT calcium score test offered  Colon due 2023

## 2024-02-28 NOTE — Assessment & Plan Note (Signed)
On Lipitor, aspirin, metoprolol

## 2024-02-28 NOTE — Assessment & Plan Note (Addendum)
On Metoprolol Monitor BP

## 2024-02-28 NOTE — Assessment & Plan Note (Signed)
No relapse 

## 2024-02-28 NOTE — Assessment & Plan Note (Signed)
 S/p surgery doing better

## 2024-02-28 NOTE — Assessment & Plan Note (Signed)
Continue with zolpidem prn  Potential benefits of a long term benzodiazepines  use as well as potential risks  and complications were explained to the patient and were aknowledged.  

## 2024-02-28 NOTE — Assessment & Plan Note (Signed)
Tramadol prn renewed  Potential benefits of a long term opioids use as well as potential risks (i.e. addiction risk, apnea etc) and complications (i.e. Somnolence, constipation and others) were explained to the patient and were aknowledged.

## 2024-03-03 ENCOUNTER — Ambulatory Visit: Payer: Self-pay | Admitting: Internal Medicine

## 2024-03-19 ENCOUNTER — Other Ambulatory Visit: Payer: Self-pay | Admitting: Internal Medicine

## 2024-03-30 LAB — COLOGUARD: COLOGUARD: NEGATIVE

## 2024-06-02 ENCOUNTER — Ambulatory Visit: Admitting: Internal Medicine

## 2024-06-05 ENCOUNTER — Other Ambulatory Visit: Payer: Self-pay | Admitting: Internal Medicine

## 2024-06-05 NOTE — Telephone Encounter (Signed)
 Copied from CRM #[id number]. Topic: Clinical - Medication Refill >> Jun 05, 2024 10:42 AM Alfonso ORN wrote: Medication:  zolpidem  (AMBIEN ) 10 MG tablet  Has the patient contacted their pharmacy? No   This is the patient's preferred pharmacy:  [id number] [place], [place] - [address] [place] [address] Phone: [phone] Fax: [phone]  Is this the correct pharmacy for this prescription? Yes If no, delete pharmacy and type the correct one.   Has the prescription been filled recently? No, 90 day supply   Is the patient out of the medication? Yes  Has the patient been seen for an appointment in the last year OR does the patient have an upcoming appointment? Yes  Can we respond through MyChart? No  Agent: Please be advised that Rx refills may take up to 3 business days. We ask that you follow-up with your pharmacy.

## 2024-06-08 MED ORDER — ZOLPIDEM TARTRATE 10 MG PO TABS: 10.0000 mg | ORAL_TABLET | Freq: Every evening | ORAL | 1 refills | Status: AC | PRN

## 2024-06-16 ENCOUNTER — Other Ambulatory Visit: Payer: Self-pay | Admitting: Surgery

## 2024-06-25 ENCOUNTER — Ambulatory Visit: Admitting: Internal Medicine

## 2024-06-25 ENCOUNTER — Encounter: Payer: Self-pay | Admitting: Internal Medicine

## 2024-06-25 MED ORDER — TRAMADOL HCL 50 MG PO TABS: 50.0000 mg | ORAL_TABLET | Freq: Four times a day (QID) | ORAL | 2 refills | Status: AC | PRN

## 2024-06-25 MED ORDER — METHOCARBAMOL 500 MG PO TABS: 500.0000 mg | ORAL_TABLET | Freq: Four times a day (QID) | ORAL | 1 refills | Status: AC | PRN

## 2024-06-25 NOTE — Progress Notes (Unsigned)
 "  Subjective:  Patient ID: Jonathon Griffin, male    DOB: [birth year]  Age: 69 y.o. MRN: [id number]  CC: Medical Management of Chronic Issues (3 Month follow up)   HPI Jonathon Griffin presents for OA, recent fall on ice  Outpatient Medications Prior to Visit  Medication Sig Dispense Refill   aspirin  EC 81 MG tablet Take 1 tablet (81 mg total) by mouth daily. 90 tablet 3   atorvastatin  (LIPITOR) 80 MG tablet TAKE 1 TABLET BY MOUTH EVERY DAY AT 6PM 90 tablet 3   Cholecalciferol (VITAMIN D3) 50 MCG (2000 UT) capsule Take 1 capsule (2,000 Units total) by mouth daily. 100 capsule 3   docusate sodium  (COLACE) 100 MG capsule Take 1 capsule (100 mg total) by mouth 2 (two) times daily. 10 capsule 0   famotidine  (PEPCID ) 20 MG tablet TAKE 1 TABLET BY MOUTH TWICE A DAY 180 tablet 1   lamoTRIgine  (LAMICTAL ) 100 MG tablet TAKE 1 TABLET BY MOUTH EVERY DAY 90 tablet 1   metoprolol  succinate (TOPROL -XL) 25 MG 24 hr tablet TAKE 1 TABLET BY MOUTH EVERY DAY 90 tablet 3   tadalafil  (CIALIS ) 5 MG tablet Take 1 tablet (5 mg total) by mouth daily. (Patient taking differently: Take 5 mg by mouth daily as needed (ed).) 90 tablet 3   zolpidem  (AMBIEN ) 10 MG tablet Take 1 tablet (10 mg total) by mouth at bedtime as needed. for sleep 90 tablet 1   methocarbamol  (ROBAXIN ) 500 MG tablet Take 1 tablet (500 mg total) by mouth every 6 (six) hours as needed for muscle spasms. 120 tablet 0   traMADol  (ULTRAM ) 50 MG tablet Take 1 tablet (50 mg total) by mouth every 6 (six) hours as needed for severe pain (pain score 7-10). 120 tablet 2   No facility-administered medications prior to visit.    ROS: Review of Systems  Constitutional:  Negative for appetite change, fatigue and unexpected weight change.  HENT:  Negative for congestion, nosebleeds, sneezing, sore throat and trouble swallowing.   Eyes:  Negative for itching and visual disturbance.  Respiratory:  Negative for cough.   Cardiovascular:  Negative for  chest pain, palpitations and leg swelling.  Gastrointestinal:  Negative for abdominal distention, blood in stool, diarrhea and nausea.  Genitourinary:  Negative for frequency and hematuria.  Musculoskeletal:  Negative for back pain, gait problem, joint swelling and neck pain.  Skin:  Negative for rash.  Neurological:  Negative for dizziness, tremors, speech difficulty and weakness.  Psychiatric/Behavioral:  Negative for agitation, dysphoric mood and sleep disturbance. The patient is not nervous/anxious.     Objective:  BP 122/78   Pulse (!) 55   Ht 5' 10 (1.778 m)   Wt 210 lb 6.4 oz (95.4 kg)   SpO2 98%   BMI 30.19 kg/m   BP Readings from Last 3 Encounters:  06/25/24 122/78  02/28/24 110/86  11/28/23 110/78    Wt Readings from Last 3 Encounters:  06/25/24 210 lb 6.4 oz (95.4 kg)  02/28/24 205 lb 12.8 oz (93.4 kg)  11/28/23 208 lb (94.3 kg)    Physical Exam Constitutional:      General: He is not in acute distress.    Appearance: Normal appearance. He is well-developed.     Comments: NAD  Eyes:     Conjunctiva/sclera: Conjunctivae normal.     Pupils: Pupils are equal, round, and reactive to light.  Neck:     Thyroid : No thyromegaly.     Vascular: No JVD.  Cardiovascular:     Rate and Rhythm: Normal rate and regular rhythm.     Heart sounds: Normal heart sounds. No murmur heard.    No friction rub. No gallop.  Pulmonary:     Effort: Pulmonary effort is normal. No respiratory distress.     Breath sounds: Normal breath sounds. No wheezing or rales.  Chest:     Chest wall: No tenderness.  Abdominal:     General: Bowel sounds are normal. There is no distension.     Palpations: Abdomen is soft. There is no mass.     Tenderness: There is no abdominal tenderness. There is no guarding or rebound.  Musculoskeletal:        General: No tenderness. Normal range of motion.     Cervical back: Normal range of motion.  Lymphadenopathy:     Cervical: No cervical adenopathy.   Skin:    General: Skin is warm and dry.     Findings: No rash.  Neurological:     Mental Status: He is alert and oriented to person, place, and time.     Cranial Nerves: No cranial nerve deficit.     Motor: No abnormal muscle tone.     Coordination: Coordination normal.     Gait: Gait normal.     Deep Tendon Reflexes: Reflexes are normal and symmetric.  Psychiatric:        Behavior: Behavior normal.        Thought Content: Thought content normal.        Judgment: Judgment normal.     Lab Results  Component Value Date   WBC 6.1 02/28/2024   HGB 13.8 02/28/2024   HCT 42.6 02/28/2024   PLT 175.0 02/28/2024   GLUCOSE 109 (H) 02/28/2024   CHOL 112 02/28/2024   TRIG 126.0 02/28/2024   HDL 41.00 02/28/2024   LDLDIRECT 51.0 03/24/2020   LDLCALC 46 02/28/2024   ALT 24 02/28/2024   AST 19 02/28/2024   NA 141 02/28/2024   K 4.6 02/28/2024   CL 105 02/28/2024   CREATININE 1.18 02/28/2024   BUN 17 02/28/2024   CO2 29 02/28/2024   TSH 1.31 02/28/2024   PSA 0.65 02/28/2024   INR 2.3 (H) 05/14/2019   HGBA1C 6.3 08/28/2022    DG Lumbar Spine 2-3 Views Result Date: 06/19/2023 CLINICAL DATA:  Elective surgery. EXAM: LUMBAR SPINE - 2-3 VIEW COMPARISON:  Radiograph 04/02/2023 FINDINGS: Two fluoroscopic spot views of the lumbar spine in the operating room. Posterior rod with pedicle screw fusion at L4-L5 with interbody spacer. Fluoroscopy time 1.7 seconds. Dose 1.24 mGy. IMPRESSION: Procedural fluoroscopy during lumbar surgery. Electronically Signed   By: Andrea Gasman M.D.   On: 06/19/2023 16:41   DG C-Arm 1-60 Min-No Report Result Date: 06/19/2023 Fluoroscopy was utilized by the requesting physician.  No radiographic interpretation.   DG O-ARM IMAGE ONLY/NO REPORT Result Date: 06/19/2023 There is no Radiologist interpretation  for this exam.   Assessment & Plan:   Problem List Items Addressed This Visit   None     Meds ordered this encounter  Medications   traMADol   (ULTRAM ) 50 MG tablet    Sig: Take 1 tablet (50 mg total) by mouth every 6 (six) hours as needed for severe pain (pain score 7-10).    Dispense:  120 tablet    Refill:  2   methocarbamol  (ROBAXIN ) 500 MG tablet    Sig: Take 1 tablet (500 mg total) by mouth every 6 (six) hours as needed  for muscle spasms.    Dispense:  120 tablet    Refill:  1      Follow-up: Return in about 3 months (around 09/22/2024) for a follow-up visit.  Marolyn Noel, MD "

## 2024-06-25 NOTE — Patient Instructions (Signed)
 [facility]OR Sauna Blanket for Detoxification, Portable Far Infrared Sauna for Home

## 2024-08-06 ENCOUNTER — Ambulatory Visit: Admitting: Cardiovascular Disease

## 2024-09-24 ENCOUNTER — Ambulatory Visit: Admitting: Internal Medicine
